# Patient Record
Sex: Female | Born: 1985 | Race: White | Hispanic: No | Smoking: Former smoker
Health system: Southern US, Community
[De-identification: ages and names within clinical notes are randomized; demographics above are authoritative.]

## PROBLEM LIST (undated history)

## (undated) ENCOUNTER — Inpatient Hospital Stay: Payer: Self-pay

## (undated) DIAGNOSIS — B977 Papillomavirus as the cause of diseases classified elsewhere: Secondary | ICD-10-CM

## (undated) DIAGNOSIS — F329 Major depressive disorder, single episode, unspecified: Secondary | ICD-10-CM

## (undated) DIAGNOSIS — F32A Depression, unspecified: Secondary | ICD-10-CM

## (undated) DIAGNOSIS — E042 Nontoxic multinodular goiter: Secondary | ICD-10-CM

## (undated) DIAGNOSIS — D069 Carcinoma in situ of cervix, unspecified: Secondary | ICD-10-CM

## (undated) DIAGNOSIS — F419 Anxiety disorder, unspecified: Secondary | ICD-10-CM

## (undated) HISTORY — DX: Nontoxic multinodular goiter: E04.2

## (undated) HISTORY — DX: Carcinoma in situ of cervix, unspecified: D06.9

## (undated) HISTORY — DX: Papillomavirus as the cause of diseases classified elsewhere: B97.7

---
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
## 2007-08-01 ENCOUNTER — Emergency Department (HOSPITAL_COMMUNITY): Admission: EM | Admit: 2007-08-01 | Discharge: 2007-08-01 | Payer: Self-pay | Admitting: Emergency Medicine

## 2008-01-21 ENCOUNTER — Emergency Department (HOSPITAL_COMMUNITY): Admission: EM | Admit: 2008-01-21 | Discharge: 2008-01-21 | Payer: Self-pay | Admitting: Family Medicine

## 2008-07-04 ENCOUNTER — Emergency Department (HOSPITAL_COMMUNITY): Admission: EM | Admit: 2008-07-04 | Discharge: 2008-07-04 | Payer: Self-pay | Admitting: Family Medicine

## 2008-09-17 ENCOUNTER — Inpatient Hospital Stay (HOSPITAL_COMMUNITY): Admission: AD | Admit: 2008-09-17 | Discharge: 2008-09-19 | Payer: Self-pay | Admitting: Obstetrics and Gynecology

## 2009-11-08 ENCOUNTER — Emergency Department (HOSPITAL_COMMUNITY): Admission: EM | Admit: 2009-11-08 | Discharge: 2009-11-08 | Payer: Self-pay | Admitting: Emergency Medicine

## 2010-06-25 ENCOUNTER — Emergency Department (HOSPITAL_COMMUNITY): Payer: Medicaid Other

## 2010-06-25 ENCOUNTER — Emergency Department (HOSPITAL_COMMUNITY)
Admission: EM | Admit: 2010-06-25 | Discharge: 2010-06-25 | Disposition: A | Discharge status: Home / Self Care | Payer: Medicaid Other | Attending: Emergency Medicine | Admitting: Emergency Medicine

## 2010-06-25 DIAGNOSIS — W2203XA Walked into furniture, initial encounter: Secondary | ICD-10-CM | POA: Insufficient documentation

## 2010-06-25 DIAGNOSIS — S90129A Contusion of unspecified lesser toe(s) without damage to nail, initial encounter: Secondary | ICD-10-CM | POA: Insufficient documentation

## 2010-06-25 DIAGNOSIS — Y92009 Unspecified place in unspecified non-institutional (private) residence as the place of occurrence of the external cause: Secondary | ICD-10-CM | POA: Insufficient documentation

## 2010-06-25 DIAGNOSIS — Z87891 Personal history of nicotine dependence: Secondary | ICD-10-CM | POA: Insufficient documentation

## 2010-06-25 DIAGNOSIS — Y93E3 Activity, vacuuming: Secondary | ICD-10-CM | POA: Insufficient documentation

## 2010-08-06 LAB — CBC
HCT: 32.9 % — ABNORMAL LOW (ref 36.0–46.0)
Hemoglobin: 11.4 g/dL — ABNORMAL LOW (ref 12.0–15.0)
MCHC: 34 g/dL (ref 30.0–36.0)
MCHC: 34.5 g/dL (ref 30.0–36.0)
MCV: 81.7 fL (ref 78.0–100.0)
Platelets: 202 10*3/uL (ref 150–400)
Platelets: 215 10*3/uL (ref 150–400)
RBC: 4.03 MIL/uL (ref 3.87–5.11)
RDW: 13.8 % (ref 11.5–15.5)
RDW: 13.9 % (ref 11.5–15.5)
WBC: 12.2 10*3/uL — ABNORMAL HIGH (ref 4.0–10.5)

## 2010-08-06 LAB — RH IMMUNE GLOB WKUP(>/=20WKS)(NOT WOMEN'S HOSP): Fetal Screen: NEGATIVE

## 2010-08-06 LAB — RPR: RPR Ser Ql: NONREACTIVE

## 2010-08-24 ENCOUNTER — Emergency Department (HOSPITAL_COMMUNITY)
Admission: EM | Admit: 2010-08-24 | Discharge: 2010-08-24 | Disposition: A | Discharge status: Home / Self Care | Payer: Medicaid Other | Attending: Emergency Medicine | Admitting: Emergency Medicine

## 2010-08-24 DIAGNOSIS — S239XXA Sprain of unspecified parts of thorax, initial encounter: Secondary | ICD-10-CM | POA: Insufficient documentation

## 2010-08-24 DIAGNOSIS — X58XXXA Exposure to other specified factors, initial encounter: Secondary | ICD-10-CM | POA: Insufficient documentation

## 2010-08-31 ENCOUNTER — Inpatient Hospital Stay (INDEPENDENT_AMBULATORY_CARE_PROVIDER_SITE_OTHER)
Admission: RE | Admit: 2010-08-31 | Discharge: 2010-08-31 | Disposition: A | Discharge status: Home / Self Care | Payer: Self-pay | Source: Ambulatory Visit | Attending: Family Medicine | Admitting: Family Medicine

## 2010-08-31 DIAGNOSIS — M799 Soft tissue disorder, unspecified: Secondary | ICD-10-CM

## 2010-09-13 NOTE — Discharge Summary (Signed)
NAMETAHARI, CLABAUGH              ACCOUNT NO.:  [id number]   MEDICAL RECORD NO.:  [id number]          PATIENT TYPE:  INP   LOCATION:  9124                          FACILITY:  [facility]   PHYSICIAN:  Malachi Pro. Ambrose Mantle, M.D. DATE OF BIRTH:  12-24-1985   DATE OF ADMISSION:  08/27/2006  DATE OF DISCHARGE:  08/29/2006                               DISCHARGE SUMMARY   An 25 year old white female, para 0, gravida 1, at 40-4/7 weeks'  gestation by 15-week ultrasound with [facility] August 26, 2006, presented with  regular contractions and leaking clear fluid since 3:30 a.m. on Aug 27, 2006.  The patient was evaluated in maternity admission, confirmed  rupture of membranes.  On vaginal exam, the cervix was 2 cm.  Prenatal  course essentially uncomplicated except for positive chlamydia at 36  weeks.  Blood group and type A negative with a negative antibody,  nonreactive serology, rubella equivocal, hepatitis B surface antigen  negative, HIV negative, GC and chlamydia negative.  One-hour Glucola  132.  Group B strep negative, positive chlamydia at 36 weeks.   PAST MEDICAL HISTORY:  Negative.  No known allergies.  No surgical  history.  No medications.   On admission, her vital signs were normal.  HEART/LUNGS:  Were normal.  The abdomen was soft and gravid, nontender.  Estimated fetal weight  about 8 pounds.  Cervix was 2 cm per the R.N. with vertex presentation and positive  rupture of membranes.   The patient received an epidural.  By 12:30 p.m., she was comfortable  with the epidural.  Fetal heart tones showed some early decelerations,  contractions every 2-4 minutes.  Pitocin at 6 milliunits a minute.  The  cervix was 7 cm, 100% and 0 station.  The patient progressed to complete  dilatation, pushed about 2 hours to deliver a living female infant at 3:46  p.m. with Apgars of 8 at 1 and 9 at five minutes.  Weight was 7 pounds  14 ounces.  Placenta expressed intact at 3:50 p.m.  Bilateral labial and  first-degree perineal lacerations were repaired with 3-0 Vicryl.  Blood  loss less than 500 mL.  Dr. Ellyn Hack was in attendance.  Postpartum, the  patient did well and was discharged on the second postpartum day.  The  patient had received her RhoGAM.  She will be a candidate for rubella  vaccine prior to discharge.  Initial hemoglobin 10.6, hematocrit 31.8,  white count 8300, platelet count 241,000.  Follow-up hemoglobin 9.1.  RPR was nonreactive.  Antibody screen was negative, and the patient  received the RhoGAM on Aug 28, 2006.   FINAL DIAGNOSES:  Intrauterine pregnancy at 40+ weeks, delivered vertex.   OPERATION:  Spontaneous delivery vertex, repair of small lacerations.   FINAL CONDITION:  Improved.   INSTRUCTIONS:  Include our regular discharge instruction booklet.  The  patient received her RhoGAM, and she will probably receive the rubella  vaccine prior to discharge.   DISCHARGE MEDICATIONS:  Per Dr. Ellyn Hack:  1. Vicodin 5/500 one or two every 4-6 hours as needed with 20 pills.  2. Motrin 800 mg  1 every 8 hours as needed, 40 pills with 2 refills.  3. Prenatal vitamins 1 a day, 100 and 2 refills.   The patient is to return to the office in 6 weeks for follow-up  examination.      Malachi Pro. Ambrose Mantle, M.D.  Electronically Signed     TFH/MEDQ  D:  08/29/2006  T:  08/29/2006  Job:  [id number]

## 2011-01-21 LAB — DIFFERENTIAL
Eosinophils Absolute: 0.1
Lymphs Abs: 1.8
Monocytes Relative: 5
Neutrophils Relative %: 75

## 2011-01-21 LAB — CBC
MCV: 83.7
RBC: 5.56 — ABNORMAL HIGH
WBC: 10

## 2011-01-21 LAB — GC/CHLAMYDIA PROBE AMP, GENITAL
Chlamydia, DNA Probe: NEGATIVE
GC Probe Amp, Genital: NEGATIVE

## 2011-01-21 LAB — BASIC METABOLIC PANEL
Chloride: 103
Creatinine, Ser: 0.8
GFR calc Af Amer: >60
Potassium: 3.4 — ABNORMAL LOW

## 2011-01-21 LAB — URINALYSIS, ROUTINE W REFLEX MICROSCOPIC
Glucose, UA: NEGATIVE
Protein, ur: NEGATIVE
Specific Gravity, Urine: 1.029
pH: 6

## 2011-01-21 LAB — WET PREP, GENITAL
Clue Cells Wet Prep HPF POC: NONE SEEN
Trich, Wet Prep: NONE SEEN
WBC, Wet Prep HPF POC: NONE SEEN

## 2011-01-27 LAB — POCT URINALYSIS DIP (DEVICE)
Glucose, UA: NEGATIVE
Hgb urine dipstick: NEGATIVE
Nitrite: NEGATIVE
Protein, ur: NEGATIVE
Urobilinogen, UA: 1
pH: 7

## 2011-11-14 ENCOUNTER — Encounter (HOSPITAL_COMMUNITY): Payer: Self-pay | Admitting: *Deleted

## 2011-11-14 ENCOUNTER — Emergency Department (HOSPITAL_COMMUNITY)
Admission: EM | Admit: 2011-11-14 | Discharge: 2011-11-14 | Disposition: A | Discharge status: Home / Self Care | Payer: Medicaid Other | Attending: Emergency Medicine | Admitting: Emergency Medicine

## 2011-11-14 DIAGNOSIS — IMO0001 Reserved for inherently not codable concepts without codable children: Secondary | ICD-10-CM | POA: Insufficient documentation

## 2011-11-14 DIAGNOSIS — W57XXXA Bitten or stung by nonvenomous insect and other nonvenomous arthropods, initial encounter: Secondary | ICD-10-CM

## 2011-11-14 MED ORDER — HYDROCORTISONE 1 % EX CREA: TOPICAL_CREAM | CUTANEOUS | Status: DC

## 2011-11-14 MED ORDER — DIPHENHYDRAMINE HCL 25 MG PO TABS: 25.0000 mg | ORAL_TABLET | Freq: Four times a day (QID) | ORAL | Status: DC

## 2011-11-14 NOTE — ED Notes (Signed)
Pt has 2 areas that are itching, one on her right elbow and one on her left shoulder.  Pt states that she thinks that she was bit by a spider last night.

## 2011-11-14 NOTE — ED Notes (Signed)
Pt states bite increasing in size since arrival.  Denies sob.  No hives noted.

## 2011-11-14 NOTE — ED Provider Notes (Signed)
History     CSN: [id number]  Arrival date & time 11/14/11  0141   First MD Initiated Contact with Patient 11/14/11 0415      Chief Complaint  Patient presents with  . Insect Bite    (Consider location/radiation/quality/duration/timing/severity/associated sxs/prior treatment) Patient is a 26 y.o. female presenting with animal bite. The history is provided by the patient (pt thiniks was bit by spider).  Animal Bite  The incident occurred just prior to arrival. The incident occurred at home. There is an injury to the right elbow. Pertinent negatives include no focal weakness, no decreased responsiveness, no light-headedness, no loss of consciousness, no seizures, no tingling, no weakness and no memory loss. There have been no prior injuries to these areas. There were no sick contacts.    History reviewed. No pertinent past medical history.  History reviewed. No pertinent past surgical history.  No family history on file.  History  Substance Use Topics  . Smoking status: Never Smoker   . Smokeless tobacco: Not on file  . Alcohol Use: No    OB History    Grav Para Term Preterm Abortions TAB SAB Ect Mult Living                  Review of Systems  Constitutional: Negative for decreased responsiveness.  Skin: Positive for rash.  Neurological: Negative for tingling, focal weakness, seizures, loss of consciousness, weakness and light-headedness.  Psychiatric/Behavioral: Negative for memory loss.  All other systems reviewed and are negative.    Allergies  Review of patient's allergies indicates no known allergies.  Home Medications   Current Outpatient Rx  Name Route Sig Dispense Refill  . SERTRALINE HCL 100 MG PO TABS Oral Take 100 mg by mouth daily.      BP 115/78  Pulse 94  Temp 97.5 F (36.4 C) (Oral)  Resp 18  SpO2 98%  LMP 11/13/2011  Physical Exam  Constitutional: She is oriented to person, place, and time. She appears well-developed and well-nourished.    HENT:  Head: Normocephalic and atraumatic.  Eyes: Conjunctivae and EOM are normal. Pupils are equal, round, and reactive to light.  Neck: Normal range of motion.  Cardiovascular: Normal rate, regular rhythm and normal heart sounds.   Pulmonary/Chest: Effort normal and breath sounds normal.  Abdominal: Soft. Bowel sounds are normal.  Musculoskeletal: Normal range of motion.  Neurological: She is alert and oriented to person, place, and time.  Skin: Skin is warm and dry. Rash noted.       Small area of induration to rt elbow and left shoulder.  No erythema,  No drainage  Psychiatric: She has a normal mood and affect. Her behavior is normal.    ED Course  Procedures (including critical care time)  Labs Reviewed - No data to display No results found.   No diagnosis found.    MDM  Minor insect bite.  Will benadryl and hydrocortisone to fu outpt        Saima Monterroso Lytle Michaels, MD 11/14/11 [phone]

## 2011-11-14 NOTE — ED Notes (Signed)
Pt with 1 inch insect bite.  No redness or warmth.  Size has not increased since I evaluated it in the waiting area.

## 2011-11-14 NOTE — ED Notes (Signed)
Pt continues to deny sob.

## 2011-12-03 ENCOUNTER — Encounter (HOSPITAL_COMMUNITY): Payer: Self-pay | Admitting: Emergency Medicine

## 2011-12-03 ENCOUNTER — Emergency Department (HOSPITAL_COMMUNITY): Payer: Self-pay

## 2011-12-03 ENCOUNTER — Emergency Department (HOSPITAL_COMMUNITY)
Admission: EM | Admit: 2011-12-03 | Discharge: 2011-12-03 | Disposition: A | Discharge status: Home / Self Care | Payer: Medicaid Other | Attending: Emergency Medicine | Admitting: Emergency Medicine

## 2011-12-03 DIAGNOSIS — S5000XA Contusion of unspecified elbow, initial encounter: Secondary | ICD-10-CM | POA: Insufficient documentation

## 2011-12-03 DIAGNOSIS — W19XXXA Unspecified fall, initial encounter: Secondary | ICD-10-CM

## 2011-12-03 DIAGNOSIS — W108XXA Fall (on) (from) other stairs and steps, initial encounter: Secondary | ICD-10-CM | POA: Insufficient documentation

## 2011-12-03 DIAGNOSIS — S5002XA Contusion of left elbow, initial encounter: Secondary | ICD-10-CM

## 2011-12-03 MED ORDER — OXYCODONE-ACETAMINOPHEN 5-325 MG PO TABS: 1.0000 | ORAL_TABLET | ORAL | Status: AC | PRN

## 2011-12-03 NOTE — ED Notes (Signed)
Spoke with radiology--reports humerus Xray will also visualize the elbow for deformity

## 2011-12-03 NOTE — ED Provider Notes (Signed)
History  This chart was scribed for Alexandria Booze, MD by Alexandria [name]. The patient was seen in room TR11C/TR11C. Patient's care was started at 1538.     CSN: [id number]  Arrival date & time 12/03/11  1538   First MD Initiated Contact with Patient 12/03/11 1700      Chief Complaint  Patient presents with  . Arm Injury    (Consider location/radiation/quality/duration/timing/severity/associated sxs/prior treatment) The history is provided by the patient. No language interpreter was used.    Alexandria [name] is a 26 y.o. female who presents to the Emergency Department complaining of moderate to severe, constant left upper arm and elbow pain with associated swelling to elbow onset 1 day ago when patient fell down four steps. Patient rates pain at 7/10 at rest and 10/10 with ROM of elbow. Patient reports no other significant injuries or symptoms. There is no significant past medical, surgical or family history. She has never smoked.  PCP - None  History  Substance Use Topics  . Smoking status: Never Smoker   . Smokeless tobacco: Not on file  . Alcohol Use: No    OB History    Grav Para Term Preterm Abortions TAB SAB Ect Mult Living                  Review of Systems  All other systems reviewed and are negative.    Allergies  Review of patient's allergies indicates no known allergies.  Home Medications   Current Outpatient Rx  Name Route Sig Dispense Refill  . SERTRALINE HCL 100 MG PO TABS Oral Take 100 mg by mouth daily.      BP 112/70  Pulse 76  Temp 98.2 F (36.8 C) (Oral)  Resp 16  SpO2 99%  LMP 11/13/2011  Physical Exam  Constitutional: She is oriented to person, place, and time. She appears well-developed and well-nourished.  HENT:  Head: Normocephalic and atraumatic.  Musculoskeletal:       Left elbow: She exhibits no swelling and no deformity. no tenderness found.       Left elbow: No swelling or deformity. No point tenderness. Pain with flexion,  extension, pronation and supination. Able to get 45 degrees of movement without pain. Neurovascularly intact. Sensation to left hand is normal.  Neurological: She is alert and oriented to person, place, and time.  Psychiatric: Her behavior is normal.    ED Course  Procedures (including critical care time) DIAGNOSTIC STUDIES: Oxygen Saturation is 99% on room air, normal by my interpretation.    COORDINATION OF CARE: 5:01am- Patient informed of current plan for treatment and evaluation and agrees with plan at this time. Will provide a sling for patient left arm. Will discharge patient home with prescription for Roxicet 5-325 mg.  Dg Humerus Left  12/03/2011  *RADIOLOGY REPORT*  Clinical Data: Arm injury.  Left arm pain.  LEFT HUMERUS - 2+ VIEW  Comparison: None.  Findings: No acute bony abnormality.  Specifically, no fracture, subluxation, or dislocation.  Soft tissues are intact.  IMPRESSION: Normal study.  Original Report Authenticated By: Alexandria [name], M.D.     1. Fall   2. Contusion of elbow, left       MDM  Fall with injury of the left elbow. X-rays will be obtained.  X-rays show no evidence of fracture. She is in with prescription for Percocet for pain and advised to followup with orthopedics if not improving. She was placed in a sling for comfort.   I  personally performed the services described in this documentation, which was scribed in my presence. The recorded information has been reviewed and considered.   Alexandria Booze, MD 12/05/11 [phone]

## 2011-12-03 NOTE — ED Notes (Signed)
Ortho tech paged to notify of orders 

## 2011-12-03 NOTE — ED Notes (Signed)
Pt reports fell down stairs last night, and injured L arm-- pt reports pain in upper arm and in elbow--elbow swollen; CMS intact distal to injury; + radial pulse

## 2011-12-03 NOTE — Progress Notes (Signed)
Orthopedic Tech Progress Note Patient Details:  Alexandria [name] [birth year] [id number]  Ortho Devices Type of Ortho Device: Arm foam sling Ortho Device/Splint Location: (L) UE Ortho Device/Splint Interventions: Application   Jennye Moccasin 12/03/2011, 5:30 PM

## 2011-12-20 ENCOUNTER — Encounter (HOSPITAL_COMMUNITY): Payer: Self-pay | Admitting: Family Medicine

## 2011-12-20 ENCOUNTER — Emergency Department (HOSPITAL_COMMUNITY)
Admission: EM | Admit: 2011-12-20 | Discharge: 2011-12-20 | Disposition: A | Discharge status: Home / Self Care | Payer: Medicaid Other | Attending: Emergency Medicine | Admitting: Emergency Medicine

## 2011-12-20 DIAGNOSIS — N898 Other specified noninflammatory disorders of vagina: Secondary | ICD-10-CM | POA: Insufficient documentation

## 2011-12-20 LAB — WET PREP, GENITAL: WBC, Wet Prep HPF POC: NONE SEEN

## 2011-12-20 MED ORDER — LIDOCAINE HCL (PF) 1 % IJ SOLN
INTRAMUSCULAR | Status: AC
  Administered 2011-12-20: 2.1 mL
  Filled 2011-12-20: qty 5

## 2011-12-20 MED ORDER — CEFTRIAXONE SODIUM 250 MG IJ SOLR
250.0000 mg | Freq: Once | INTRAMUSCULAR | Status: AC
  Administered 2011-12-20: 250 mg via INTRAMUSCULAR
  Filled 2011-12-20: qty 250

## 2011-12-20 MED ORDER — AZITHROMYCIN 250 MG PO TABS
1000.0000 mg | ORAL_TABLET | Freq: Once | ORAL | Status: AC
  Administered 2011-12-20: 1000 mg via ORAL
  Filled 2011-12-20: qty 4

## 2011-12-20 NOTE — ED Provider Notes (Signed)
History     CSN: [id number]  Arrival date & time 12/20/11  4696   First MD Initiated Contact with Patient 12/20/11 2232      Chief Complaint  Patient presents with  . Vaginal Discharge    (Consider location/radiation/quality/duration/timing/severity/associated sxs/prior treatment) HPI Comments: Patient presents with white vaginal discharge and odor for approximately one week. Patient states that the symptoms she is having now are similar to previous when she had bacterial vaginosis. Patient is requesting a check for this. Patient states that she recently had unprotected sex and also wants to be checked for sexually transmitted diseases. She requests to be treated for GC/ Chlamydia. She denies urinary symptoms. She denies abdominal pain, nausea, vomiting, fever. Onset gradual. Course is constant. Nothing makes symptoms better or worse. No treatments prior to arrival. LMP 2 weeks ago and was normal for her.   Patient is a 26 y.o. female presenting with vaginal discharge. The history is provided by the patient.  Vaginal Discharge This is a new problem. The current episode started in the past 7 days. The problem occurs constantly. The problem has been unchanged. Pertinent negatives include no abdominal pain, chest pain, coughing, fever, headaches, myalgias, nausea, rash, sore throat or vomiting. Nothing aggravates the symptoms. She has tried nothing for the symptoms.    History reviewed. No pertinent past medical history.  History reviewed. No pertinent past surgical history.  History reviewed. No pertinent family history.  History  Substance Use Topics  . Smoking status: Never Smoker   . Smokeless tobacco: Not on file  . Alcohol Use: No    OB History    Grav Para Term Preterm Abortions TAB SAB Ect Mult Living                  Review of Systems  Constitutional: Negative for fever.  HENT: Negative for sore throat and rhinorrhea.   Eyes: Negative for redness.  Respiratory:  Negative for cough.   Cardiovascular: Negative for chest pain.  Gastrointestinal: Negative for nausea, vomiting, abdominal pain and diarrhea.  Genitourinary: Positive for vaginal discharge. Negative for dysuria, flank pain and vaginal bleeding.  Musculoskeletal: Negative for myalgias.  Skin: Negative for rash.  Neurological: Negative for headaches.    Allergies  Review of patient's allergies indicates no known allergies.  Home Medications   Current Outpatient Rx  Name Route Sig Dispense Refill  . SERTRALINE HCL 100 MG PO TABS Oral Take 100 mg by mouth daily.      BP 103/64  Pulse 115  Temp 98.4 F (36.9 C) (Oral)  Resp 16  SpO2 97%  Physical Exam  Nursing note and vitals reviewed. Constitutional: She appears well-developed and well-nourished.  HENT:  Head: Normocephalic and atraumatic.  Eyes: Conjunctivae are normal. Right eye exhibits no discharge. Left eye exhibits no discharge.  Neck: Normal range of motion. Neck supple.  Cardiovascular: Normal rate, regular rhythm and normal heart sounds.   Pulmonary/Chest: Effort normal and breath sounds normal.  Abdominal: Soft. There is no tenderness.  Genitourinary: Uterus is not tender. Cervix exhibits discharge (thin white). Cervix exhibits no motion tenderness and no friability. Right adnexum displays no mass, no tenderness and no fullness. Left adnexum displays no mass, no tenderness and no fullness.  Neurological: She is alert.  Skin: Skin is warm and dry.  Psychiatric: She has a normal mood and affect.    ED Course  Procedures (including critical care time)   Labs Reviewed  WET PREP, GENITAL  GC/CHLAMYDIA PROBE AMP,  GENITAL   No results found.   1. Vaginal Discharge     10:46 PM Patient seen and examined. Medications ordered. Will perform pelvic exam.   Vital signs reviewed and are as follows: Filed Vitals:   12/20/11 1838  BP: 103/64  Pulse: 115  Temp: 98.4 F (36.9 C)  Resp: 16   11:08 PM Pelvic exam  performed with nurse tech chaperone.   11:30 PM patient has received Rocephin and azithromycin. Patient informed of what prep results. She is urged not to have sexual intercourse for 7 days and follow up with health department for further testing. Patient verbalizes understanding and agrees with plan.   MDM  White vaginal discharge. Wet prep is negative for infections. Patient treated for GC/Chlamydia. She appears well. No adnexal tenderness on exam. No concern for AP. No concern for tubo-ovarian abscess.         [place], [place] 12/20/11 [phone]

## 2011-12-20 NOTE — ED Notes (Signed)
Patient updated on wait time; apologized about delay.  Patient verbalized understanding; patient denies any changes in symptoms.  Will continue to monitor.

## 2011-12-20 NOTE — ED Notes (Signed)
Patient asking about wait time; apologized about delay.  Patient verbalized understanding; no respiratory or acute distress.  Will continue to monitor.

## 2011-12-20 NOTE — ED Notes (Signed)
PELVIC CART SET UP AT BEDSIDE BY NURSE TECH.

## 2011-12-20 NOTE — ED Notes (Signed)
Per  Pt white vaginal discharge for about 1 month. Denies bleeding or pain.

## 2011-12-21 NOTE — ED Provider Notes (Signed)
Medical screening examination/treatment/procedure(s) were performed by non-physician practitioner and as supervising physician I was immediately available for consultation/collaboration.  Flint Melter, MD 12/21/11 [phone]

## 2012-01-14 ENCOUNTER — Emergency Department (HOSPITAL_COMMUNITY)
Admission: EM | Admit: 2012-01-14 | Discharge: 2012-01-14 | Disposition: A | Discharge status: Home / Self Care | Payer: Medicaid Other | Attending: Emergency Medicine | Admitting: Emergency Medicine

## 2012-01-14 DIAGNOSIS — W57XXXA Bitten or stung by nonvenomous insect and other nonvenomous arthropods, initial encounter: Secondary | ICD-10-CM | POA: Insufficient documentation

## 2012-01-14 DIAGNOSIS — T148 Other injury of unspecified body region: Secondary | ICD-10-CM | POA: Insufficient documentation

## 2012-01-14 DIAGNOSIS — R21 Rash and other nonspecific skin eruption: Secondary | ICD-10-CM | POA: Insufficient documentation

## 2012-01-14 DIAGNOSIS — L739 Follicular disorder, unspecified: Secondary | ICD-10-CM

## 2012-01-14 DIAGNOSIS — L738 Other specified follicular disorders: Secondary | ICD-10-CM | POA: Insufficient documentation

## 2012-01-14 MED ORDER — CEPHALEXIN 500 MG PO CAPS: 500.0000 mg | ORAL_CAPSULE | Freq: Four times a day (QID) | ORAL | Status: DC

## 2012-01-14 MED ORDER — PERMETHRIN 5 % EX CREA: TOPICAL_CREAM | CUTANEOUS | Status: DC

## 2012-01-14 NOTE — ED Provider Notes (Signed)
Medical screening examination/treatment/procedure(s) were performed by non-physician practitioner and as supervising physician I was immediately available for consultation/collaboration.   Gerhard Munch, MD 01/14/12 1054

## 2012-01-14 NOTE — ED Provider Notes (Signed)
History     CSN: [id number]  Arrival date & time 01/14/12  8295   First MD Initiated Contact with Patient 01/14/12 [phone]      Chief Complaint  Patient presents with  . Insect Bite    (Consider location/radiation/quality/duration/timing/severity/associated sxs/prior treatment) The history is provided by the patient and medical records.    Alexandria [name] is a 26 y.o. female presents to the emergency department complaining of insect bites.  The onset of the symptoms was  gradual starting 2 months ago.  The patient has associated itching, rash on mans pubis.  The symptoms have been  intermittent, stabilized.  nothing makes the symptoms worse and nothing makes symptoms better.  The patient denies fever, chills, headache, neck pain, nausea, vomiting, shortness of breath, chest pain.  Pt states she's been seen here a number of times for the same thing. She states she's always diagnosed with insect bites. She states she no longer goes outside anymore because of mosquito bites.  She lives at home with her 2 children and an aunt. She states none of them have any of the same symptoms.  Has a history of eczema but she states these are different.  She also complains of a rash in her vaginal area.  She states that this is recent and she "scraped white things off of it yesterday"  patient states it itches and is now painful.  She denies history of unprotected sex. She denies sexual partners with same symptoms.  Vaginal rash began 5 days ago.  Pt does shave her private area.  She also states she swims in a public pool.  She states she is very concerned about scabies and no one is treating her for this.   No past medical history on file.  No past surgical history on file.  No family history on file.  History  Substance Use Topics  . Smoking status: Never Smoker   . Smokeless tobacco: Not on file  . Alcohol Use: No    OB History    Grav Para Term Preterm Abortions TAB SAB Ect Mult Living         Review of Systems  Constitutional: Negative for fever, diaphoresis, appetite change, fatigue and unexpected weight change.  HENT: Negative for mouth sores and neck stiffness.   Eyes: Negative for visual disturbance.  Respiratory: Negative for cough, chest tightness, shortness of breath and wheezing.   Cardiovascular: Negative for chest pain.  Gastrointestinal: Negative for nausea, vomiting, abdominal pain, diarrhea and constipation.  Genitourinary: Negative for dysuria, urgency, frequency and hematuria.  Skin: Positive for rash.  Neurological: Negative for syncope, light-headedness and headaches.  Psychiatric/Behavioral: Negative for disturbed wake/sleep cycle. The patient is not nervous/anxious.     Allergies  Review of patient's allergies indicates no known allergies.  Home Medications   Current Outpatient Rx  Name Route Sig Dispense Refill  . OVER THE COUNTER MEDICATION Topical Apply 1 application topically as needed. Benadryl creams  For itching.    Marland Kitchen SERTRALINE HCL 100 MG PO TABS Oral Take 100 mg by mouth daily.    . CEPHALEXIN 500 MG PO CAPS Oral Take 1 capsule (500 mg total) by mouth 4 (four) times daily. 40 capsule 0  . PERMETHRIN 5 % EX CREA  Apply to entire body other than face - let sit for 14 hours then wash off, may repeat in 1 week if still having symptoms 60 g 1    There were no vitals taken for this visit.  Physical Exam  Nursing note and vitals reviewed. Constitutional: She appears well-developed and well-nourished. No distress.  HENT:  Head: Normocephalic and atraumatic.  Mouth/Throat: Oropharynx is clear and moist. No oropharyngeal exudate.  Eyes: Conjunctivae normal are normal. No scleral icterus.  Neck: Normal range of motion.  Cardiovascular: Normal rate, regular rhythm, normal heart sounds and intact distal pulses.  Exam reveals no gallop and no friction rub.   No murmur heard. Pulmonary/Chest: Effort normal and breath sounds normal. No  respiratory distress. She has no wheezes.  Musculoskeletal: Normal range of motion. She exhibits no edema.  Neurological: She is alert.       Speech is clear and goal oriented Moves extremities without ataxia  Skin: Skin is warm and dry. Lesion and rash noted. Rash is not vesicular and not urticarial. She is not diaphoretic.          [name] areas of erythema on her feet, arms and hands, most of then excoriated and scabbing  Psychiatric: She has a normal mood and affect.    ED Course  Procedures (including critical care time)  Labs Reviewed - No data to display No results found.   1. Rash   2. Folliculitis       MDM  Alexandria [name] presents with insect bites and vaginal rash.  Likely folliculitis of the vaginal area. Will treat with Keflex.  Patient particularly concerned about scabies therefore I will treat for same.  And strict instructions on scabies her medication from home.    I have discussed this with the patient.  I have also discussed reasons to return immediately to the ER.  Patient expresses understanding and agrees with plan.  1. Medications: keflex, permethrin 2. Treatment: use permethrin as directed, take antibiotic as directed; do not scratch bites; do not pop pustules, do not shave area with folliculitis 3. Follow Up: Dermatology          Dierdre Forth, PA-C 01/14/12 [phone]

## 2012-01-14 NOTE — ED Notes (Signed)
Pt reports some sort of insect bite marks all over body.  States they itch badly and have seemed to spread even to groin area.  Pt states she has even found what appears to be eggs on her skin.  Denies anyone else in home is having this problem.  States she has been in RD for same in past 2 months and was told it is mosquito bites.  Pt alert oriented X4

## 2012-03-13 ENCOUNTER — Encounter (HOSPITAL_COMMUNITY): Payer: Self-pay | Admitting: Physical Medicine and Rehabilitation

## 2012-03-13 ENCOUNTER — Emergency Department (HOSPITAL_COMMUNITY)
Admission: EM | Admit: 2012-03-13 | Discharge: 2012-03-13 | Disposition: A | Discharge status: Home / Self Care | Payer: Self-pay | Attending: Emergency Medicine | Admitting: Emergency Medicine

## 2012-03-13 DIAGNOSIS — R21 Rash and other nonspecific skin eruption: Secondary | ICD-10-CM | POA: Insufficient documentation

## 2012-03-13 DIAGNOSIS — L299 Pruritus, unspecified: Secondary | ICD-10-CM | POA: Insufficient documentation

## 2012-03-13 DIAGNOSIS — R109 Unspecified abdominal pain: Secondary | ICD-10-CM | POA: Insufficient documentation

## 2012-03-13 MED ORDER — HYDROXYZINE HCL 25 MG PO TABS: 25.0000 mg | ORAL_TABLET | Freq: Four times a day (QID) | ORAL | Status: DC | PRN

## 2012-03-13 MED ORDER — PREDNISONE 20 MG PO TABS: 20.0000 mg | ORAL_TABLET | Freq: Every day | ORAL | Status: DC

## 2012-03-13 MED ORDER — DIPHENHYDRAMINE HCL 25 MG PO CAPS
25.0000 mg | ORAL_CAPSULE | Freq: Once | ORAL | Status: AC
  Administered 2012-03-13: 25 mg via ORAL
  Filled 2012-03-13: qty 1

## 2012-03-13 MED ORDER — PREDNISONE 20 MG PO TABS
40.0000 mg | ORAL_TABLET | Freq: Once | ORAL | Status: AC
  Administered 2012-03-13: 40 mg via ORAL
  Filled 2012-03-13: qty 2

## 2012-03-13 NOTE — ED Notes (Addendum)
Pt presents to department for evaluation of possible allergic reaction. States she noticed hives to bilateral arms and legs x2 days ago. Pt states severe itching. History of eczema. Denies pain. She is alert and oriented x4. No signs of distress noted. Respirations unlabored. No wheezing noted.

## 2012-03-13 NOTE — ED Notes (Addendum)
Pt c/o rash that started a few days ago on hand and arms but now feels like she is itchy all over and that the rash is spreading to legs. Pt denies any new body wash/soap, but reports her mom washed some of her clothes and normally she doesn't. Pt in nad, airway in tact, pt able to speak in full sentences, skin warm, dry and pink. Pt also reports she recently had an abortion and had intercourse before she was told she was allowed to and is afraid that is what is causing the rash. Pt denies any abnormal vaginal d/c, reports she is still spotting a little bit but that's how its been for a few weeks.

## 2012-03-13 NOTE — ED Provider Notes (Signed)
History  This chart was scribed for Alexandria Razor, MD by Ladona Ridgel Day, ED scribe. This patient was seen in room TR11C/TR11C and the patient's care was started at 1225.   CSN: [id number]  Arrival date & time 03/13/12  1225   First MD Initiated Contact with Patient 03/13/12 1352      Chief Complaint  Patient presents with  . Allergic Reaction   Patient is a 26 y.o. female presenting with allergic reaction. The history is provided by the patient. No language interpreter was used.  Allergic Reaction The primary symptoms are  abdominal pain (cramping ) and rash (itchy, diffuse over her body). The primary symptoms do not include shortness of breath, nausea or vomiting. The current episode started more than 2 days ago. The problem has not changed since onset.This is a new problem.  The rash is associated with itching.   The onset of the reaction was associated with a new medication. Significant symptoms also include itching. Significant symptoms that are not present include eye redness.  Alexandria [name] is a 26 y.o. female who presents to the Emergency Department complaining of a diffuse itchy rash over her body which began 3 days ago. She states 8 days ago had surgical abortion and was started on a new oral birth control medicine 6 days ago. She states worried that she's having an allergic reaction to her medicine and has been having an itching/erythematous rash all over her body the past 3 days. She states post abortion, with some light spotting, no vaginal discharge or heavy bleeding, and associated mild abdominal cramping.   No past medical history on file.  No past surgical history on file.  No family history on file.  History  Substance Use Topics  . Smoking status: Never Smoker   . Smokeless tobacco: Not on file  . Alcohol Use: No    OB History    Grav Para Term Preterm Abortions TAB SAB Ect Mult Living   1    1           Review of Systems  Constitutional: Negative for fever  and chills.  HENT: Negative for congestion.   Eyes: Negative for redness.  Respiratory: Negative for shortness of breath.   Gastrointestinal: Positive for abdominal pain (cramping ). Negative for nausea and vomiting.  Skin: Positive for itching and rash (itchy, diffuse over her body).  Neurological: Negative for weakness.  All other systems reviewed and are negative.    Allergies  Review of patient's allergies indicates no known allergies.  Home Medications   Current Outpatient Rx  Name  Route  Sig  Dispense  Refill  . OVER THE COUNTER MEDICATION   Topical   Apply 1 application topically daily as needed. Benadryl creams  For itching.         Marland Kitchen PERMETHRIN 5 % EX CREA   Topical   Apply 1 application topically 2 (two) times daily.         . SERTRALINE HCL 100 MG PO TABS   Oral   Take 100 mg by mouth daily.           Triage Vitals: BP 139/85  Pulse 108  Temp 97.9 F (36.6 C) (Oral)  Resp 20  SpO2 97%  Physical Exam  Nursing note and vitals reviewed. Constitutional: She appears well-developed and well-nourished. No distress.  HENT:  Head: Normocephalic and atraumatic.  Eyes: Conjunctivae normal are normal. Right eye exhibits no discharge. Left eye exhibits no discharge.  Neck:  Neck supple.  Cardiovascular: Normal rate, regular rhythm and normal heart sounds.  Exam reveals no gallop and no friction rub.   No murmur heard. Pulmonary/Chest: Effort normal and breath sounds normal. No respiratory distress.  Abdominal: Soft. She exhibits no distension. There is no tenderness.  Musculoskeletal: She exhibits no edema and no tenderness.  Neurological: She is alert.  Skin: Skin is warm and dry. Rash noted.       Diffuse erythematous papular rash of her BUE and an area on upper back, non tender, no drainage.   Psychiatric: She has a normal mood and affect. Her behavior is normal. Thought content normal.    ED Course  Procedures (including critical care  time) DIAGNOSTIC STUDIES: Oxygen Saturation is 97% on room air, normal by my interpretation.    COORDINATION OF CARE: At 220 PM Discussed treatment plan with patient which includes prednisone. Patient agrees.   Labs Reviewed - No data to display No results found.   1. Rash       MDM  27 year old female with rash consistent with a mild allergic reaction. No evidence of hemodynamic instability or airway compromise. Plan symptomatic treatment. Return precautions discussed. Outpatient followup as needed otherwise.  I personally preformed the services scribed in my presence. The recorded information has been reviewed and considered. Alexandria Razor, MD.          Alexandria Razor, MD 03/15/12 1022

## 2012-04-28 HISTORY — PX: CERVICAL BIOPSY  W/ LOOP ELECTRODE EXCISION: SUR135

## 2012-06-15 ENCOUNTER — Other Ambulatory Visit (HOSPITAL_COMMUNITY)
Admission: RE | Admit: 2012-06-15 | Discharge: 2012-06-15 | Disposition: A | Discharge status: Home / Self Care | Payer: Medicaid Other | Source: Ambulatory Visit | Attending: Unknown Physician Specialty | Admitting: Unknown Physician Specialty

## 2012-06-15 DIAGNOSIS — D069 Carcinoma in situ of cervix, unspecified: Secondary | ICD-10-CM | POA: Insufficient documentation

## 2012-06-15 DIAGNOSIS — R87610 Atypical squamous cells of undetermined significance on cytologic smear of cervix (ASC-US): Secondary | ICD-10-CM | POA: Insufficient documentation

## 2012-06-15 HISTORY — PX: COLPOSCOPY: SHX161

## 2012-07-15 ENCOUNTER — Telehealth: Payer: Self-pay

## 2012-07-15 NOTE — Telephone Encounter (Signed)
Returned patients call and left a message requesting that patient call us back if she still has a question. (We do not do payment plans unfortunately, If patient wants gardasil she will have to pay full price at time of injection)

## 2012-07-15 NOTE — Telephone Encounter (Signed)
Patient called in and stated she has already received one gardasil injection and would like to discuss a payment plan to get the other 2 injections while she is waiting on her medicaid.  She states she has a LEEP procedure coming up and feels very strongly that she needs the last 2 gardasil injections.  I did not see any future appointments on her chart.

## 2012-07-17 ENCOUNTER — Emergency Department (INDEPENDENT_AMBULATORY_CARE_PROVIDER_SITE_OTHER)
Admission: EM | Admit: 2012-07-17 | Discharge: 2012-07-17 | Disposition: A | Discharge status: Home / Self Care | Payer: Medicaid Other | Source: Home / Self Care | Attending: Family Medicine | Admitting: Family Medicine

## 2012-07-17 ENCOUNTER — Encounter (HOSPITAL_COMMUNITY): Payer: Self-pay | Admitting: Emergency Medicine

## 2012-07-17 DIAGNOSIS — Z331 Pregnant state, incidental: Secondary | ICD-10-CM

## 2012-07-17 DIAGNOSIS — Z3201 Encounter for pregnancy test, result positive: Secondary | ICD-10-CM

## 2012-07-17 LAB — POCT URINALYSIS DIP (DEVICE)
Bilirubin Urine: NEGATIVE
Hgb urine dipstick: NEGATIVE
Ketones, ur: NEGATIVE mg/dL
Specific Gravity, Urine: 1.015 (ref 1.005–1.030)
pH: 7 (ref 5.0–8.0)

## 2012-07-17 NOTE — ED Notes (Signed)
Pt c/o nausea x 4 days. 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 Has been experiencing vaginal discharge. Refuses pap smear today. Not on any birth control. No fever, diarrhea.

## 2012-07-17 NOTE — ED Provider Notes (Signed)
History     CSN: [id number]  Arrival date & time 07/17/12  1309   First MD Initiated Contact with Patient 07/17/12 1330      Chief Complaint  Patient presents with  . Possible Pregnancy    (Consider location/radiation/quality/duration/timing/severity/associated sxs/prior treatment) Patient is a 27 y.o. female presenting with female genitourinary complaint. The history is provided by the patient.  Female GU Problem Primary symptoms include no vaginal bleeding. Episode onset: nausea and heartburn for 3-4 days, did home preg test this am, weak pos, lmp 2/23. here for preg test.    History reviewed. No pertinent past medical history.  History reviewed. No pertinent past surgical history.  No family history on file.  History  Substance Use Topics  . Smoking status: Never Smoker   . Smokeless tobacco: Not on file  . Alcohol Use: No    OB History   Grav Para Term Preterm Abortions TAB SAB Ect Mult Living   1    1           Review of Systems  Constitutional: Negative.   Gastrointestinal: Negative.   Genitourinary: Negative.  Negative for vaginal bleeding.    Allergies  Review of patient's allergies indicates no known allergies.  Home Medications   Current Outpatient Rx  Name  Route  Sig  Dispense  Refill  . gabapentin (NEURONTIN) 300 MG capsule   Oral   Take 300 mg by mouth 4 (four) times daily.         Marland Kitchen glycopyrrolate (ROBINUL) 2 MG tablet   Oral   Take 2 mg by mouth 2 (two) times daily.         . sertraline (ZOLOFT) 100 MG tablet   Oral   Take 50 mg by mouth daily.          . hydrOXYzine (ATARAX/VISTARIL) 25 MG tablet   Oral   Take 1 tablet (25 mg total) by mouth every 6 (six) hours as needed for itching.   20 tablet   0   . OVER THE COUNTER MEDICATION   Topical   Apply 1 application topically daily as needed. Benadryl creams  For itching.         . permethrin (ELIMITE) 5 % cream   Topical   Apply 1 application topically 2 (two) times  daily.         . predniSONE (DELTASONE) 20 MG tablet   Oral   Take 1 tablet (20 mg total) by mouth daily.   5 tablet   0     BP 105/73  Pulse 70  Temp(Src) 98 F (36.7 C) (Oral)  SpO2 95%  LMP 06/22/2012  Physical Exam  Nursing note and vitals reviewed. Constitutional: She is oriented to person, place, and time. She appears well-developed and well-nourished.  Abdominal: Soft. Bowel sounds are normal. She exhibits no mass. There is no tenderness. There is no rebound and no guarding.  Neurological: She is alert and oriented to person, place, and time.  Skin: Skin is warm and dry.    ED Course  Procedures (including critical care time)  Labs Reviewed  POCT PREGNANCY, URINE - Abnormal; Notable for the following:    Preg Test, Ur POSITIVE (*)    All other components within normal limits   No results found.   1. Pregnancy as incidental finding       MDM  upreg pos.        Linna Hoff, MD 07/17/12 1434

## 2012-07-19 NOTE — Telephone Encounter (Signed)
Patient not returning call. Will discuss at her upcoming visit.

## 2012-08-06 ENCOUNTER — Encounter: Payer: Self-pay | Admitting: Obstetrics and Gynecology

## 2012-08-06 ENCOUNTER — Other Ambulatory Visit: Payer: Self-pay | Admitting: Obstetrics and Gynecology

## 2012-08-06 ENCOUNTER — Ambulatory Visit (INDEPENDENT_AMBULATORY_CARE_PROVIDER_SITE_OTHER): Payer: Medicaid Other | Admitting: Obstetrics and Gynecology

## 2012-08-06 VITALS — BP 115/73 | HR 63 | Temp 97.2°F | Ht 65.0 in | Wt 166.7 lb

## 2012-08-06 DIAGNOSIS — O360990 Maternal care for other rhesus isoimmunization, unspecified trimester, not applicable or unspecified: Secondary | ICD-10-CM

## 2012-08-06 DIAGNOSIS — Z3481 Encounter for supervision of other normal pregnancy, first trimester: Secondary | ICD-10-CM

## 2012-08-06 DIAGNOSIS — D069 Carcinoma in situ of cervix, unspecified: Secondary | ICD-10-CM | POA: Insufficient documentation

## 2012-08-06 DIAGNOSIS — Z3490 Encounter for supervision of normal pregnancy, unspecified, unspecified trimester: Secondary | ICD-10-CM | POA: Insufficient documentation

## 2012-08-06 LAB — HIV ANTIBODY (ROUTINE TESTING W REFLEX): HIV: NONREACTIVE

## 2012-08-06 NOTE — Progress Notes (Signed)
Patient ID: Alexandria [name], female   DOB: [birth year], 27 y.o.   MRN: [id number] 27 yo [id number] with LMP 2/25 presenting today as a referral from [facility] department for evaluation of abnormal pap smear and colposcopy. Patient had ASCUS pap smear with + HPV in 03/2012. 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 Patient recently found out she is pregnant. She is unsure of her LMP. Patient has not initiated [facility] as she is waiting on her Medicaid.  GENERAL: Well-developed, well-nourished female in no acute distress.  HEENT: Normocephalic, atraumatic. Sclerae anicteric.  NECK: Supple. Normal thyroid.  LUNGS: Clear to auscultation bilaterally.  HEART: Regular rate and rhythm. BREASTS: Symmetric in size. No palpable masses or lymphadenopathy, skin changes, or nipple drainage. ABDOMEN: Soft, nontender, nondistended. No organomegaly. PELVIC: Normal external female genitalia. Vagina is pink and rugated.  Normal discharge. Normal appearing cervix. Uterus is normal in size.  No adnexal mass or tenderness. EXTREMITIES: No cyanosis, clubbing, or edema, 2+ distal pulses.  A/P 27 yo [id number] at unknown gestational age - Treatment optionof CIN III discussed with patient who wishes to delay treatment until postpartum. Discussed other option to repeat pap smear with colpo q 12 weeks to monitor disease progression. Patient remains undecided but visibly anxious. - Will obtain ob panel and dating ultrasound - Patient to return for first ob visit as soon as possible

## 2012-08-06 NOTE — Patient Instructions (Addendum)
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 If you wish to delay LEEP until after you have the baby, we can repeat the pap smear several times during pregnancy to make sure that it doesn't get worst.  Abnormal Pap Test Information During a Pap test, the cells on the surface of your cervix are checked to see if they look normal, abnormal, or if they show signs of having been altered by a certain type of virus called human papillomavirus, or HPV. Cervical cells that have been affected by HPV are called dysplasia. Dysplasia is not cancer, but describes abnormal cells found on the surface of the cervix. Depending on the degree of dysplasia, some of the cells may be considered pre-cancerous and may turn into cancer over time if follow up with a caregiver is delayed.  WHAT DOES AN ABNORMAL PAP TEST MEAN? Having an abnormal pap test does not mean that you have cancer. However, certain types of abnormal pap tests can be a sign that a person is at a higher risk of developing cancer. Your caregiver will want to do other tests to find out more about the abnormal cells. Your abnormal Pap test results could show:   Small and uncertain changes that should be carefully watched.   Cervical dysplasia that has caused mild changes and can be followed over time.  Cervical dysplasia that is more severe and needs to be followed and treated to ensure the problem goes away.  Cancer.  When severe cervical dysplasia is found and treated early, it rarely will grow into cancer.  WHAT WILL BE DONE ABOUT MY ABNORMAL PAP TEST?  A colposcopy may be needed. This is a procedure where your cervix is examined using light and magnification.  A small tissue sample of your cervix (biopsy) may need to be removed and then examined. This is often performed if there are areas that appear infected.  A sample of cells from the cervical canal may be removed with either a small brush or scraping instrument (curette). Based on the  results of the procedures above, some caregivers may recommend either cryotherapy of the cervix or a surgical LEEP where a portion of the cervix is removed. LEEP is short for "loop electrical excisional procedure." Rarely, a caregiver may recommend a cone biopsy.This is a procedure where a small, cone-shaped sample of your cervix is taken out. The part that is taken out is the area where the abnormal cells are.  WHAT IF I HAVE A DYSPLASIA OR A CANCER? You may be referred to a specialist. Radiation may also be a treatment for more advanced cancer. Having a hysterectomy is the last treatment option for dysplasia, but it is a more common treatment for someone with cancer. All treatment options will be discussed with you by your caregiver. WHAT SHOULD YOU DO AFTER BEING TREATED? If you have had an abnormal pap test, you should continue to have regular pap tests and check-ups as directed by your caregiver. Your cervical problem will be carefully watched so it does not get worse. Also, your caregiver can watch for, and treat, any new problems that may come up. Document Released: 07/30/2010 Document Revised: 07/07/2011 Document Reviewed: 04/10/2011 [facility] Patient Information 2013 [place], [place].  Pregnant women - Pregnant women with cervical intraepithelial neoplasia 1 (CIN 1) should not undergo cervical excision or ablation, regardless of the duration of the abnormality and irrespective of whether the antecedent tests were high-grade (atypical squamous cells, cannot rule out high grade squamous intraepithelial lesion [  ASC-H] or high-grade squamous intraepithelial lesion [HSIL]). The patient should be reevaluated six weeks postpartum and managed based on those results.  For pregnant women with CIN 2,3, if invasive disease is not suspected, there are two options for follow-up [2,38-40]: ?Repeat evaluation with cytology and colposcopy during the pregnancy, but not more often than every 12 weeks. A biopsy may  be repeated only if the appearance of the lesion worsens or if cytology suggests invasive disease. Endocervical sampling with a curette and endometrial sampling should NOT be performed, as there is a risk of disturbing the pregnancy.  ?Alternatively, reevaluation may be deferred until six weeks postpartum.  A diagnostic excisional procedure is performed only if invasive disease is suspected. (See "Cervical cancer in pregnancy".) High-grade lesions discovered during pregnancy have a high rate of regression in the postpartum period. As an example, in one study, 70 percent of 153 women with CIN 3 had regression and none progressed to invasive carcinoma [38]. This underscores the role for conservative antepartum management followed by careful postpartum evaluation. Furthermore, the morbidity associated with cervical conization during pregnancy is substantial.

## 2012-08-09 ENCOUNTER — Encounter: Payer: Self-pay | Admitting: Obstetrics and Gynecology

## 2012-08-09 DIAGNOSIS — Z6791 Unspecified blood type, Rh negative: Secondary | ICD-10-CM | POA: Insufficient documentation

## 2012-08-09 DIAGNOSIS — O26899 Other specified pregnancy related conditions, unspecified trimester: Secondary | ICD-10-CM

## 2012-08-09 LAB — OBSTETRIC PANEL
Hemoglobin: 14 g/dL (ref 12.0–15.0)
Hepatitis B Surface Ag: NEGATIVE
Lymphs Abs: 2.3 10*3/uL (ref 0.7–4.0)
Monocytes Relative: 8 % (ref 3–12)
Neutro Abs: 3.9 10*3/uL (ref 1.7–7.7)
Neutrophils Relative %: 58 % (ref 43–77)
Platelets: 273 10*3/uL (ref 150–400)
RBC: 4.74 MIL/uL (ref 3.87–5.11)
Rh Type: NEGATIVE
WBC: 6.9 10*3/uL (ref 4.0–10.5)

## 2012-08-10 LAB — CULTURE, OB URINE

## 2012-08-12 ENCOUNTER — Encounter (HOSPITAL_COMMUNITY): Payer: Self-pay

## 2012-08-12 ENCOUNTER — Ambulatory Visit (HOSPITAL_COMMUNITY)
Admission: RE | Admit: 2012-08-12 | Discharge: 2012-08-12 | Disposition: A | Discharge status: Home / Self Care | Payer: Medicaid Other | Source: Ambulatory Visit | Attending: Obstetrics and Gynecology | Admitting: Obstetrics and Gynecology

## 2012-08-12 DIAGNOSIS — Z3481 Encounter for supervision of other normal pregnancy, first trimester: Secondary | ICD-10-CM

## 2012-08-12 DIAGNOSIS — Z3689 Encounter for other specified antenatal screening: Secondary | ICD-10-CM | POA: Insufficient documentation

## 2012-08-16 ENCOUNTER — Encounter: Payer: Self-pay | Admitting: *Deleted

## 2012-09-15 ENCOUNTER — Encounter: Payer: Self-pay | Admitting: Family Medicine

## 2012-09-21 ENCOUNTER — Inpatient Hospital Stay (HOSPITAL_COMMUNITY)
Admission: AD | Admit: 2012-09-21 | Discharge: 2012-09-21 | Disposition: A | Discharge status: Home / Self Care | Payer: Medicaid Other | Source: Ambulatory Visit | Attending: Obstetrics & Gynecology | Admitting: Obstetrics & Gynecology

## 2012-09-21 ENCOUNTER — Encounter (HOSPITAL_COMMUNITY): Payer: Self-pay | Admitting: *Deleted

## 2012-09-21 DIAGNOSIS — N760 Acute vaginitis: Secondary | ICD-10-CM

## 2012-09-21 DIAGNOSIS — B9689 Other specified bacterial agents as the cause of diseases classified elsewhere: Secondary | ICD-10-CM

## 2012-09-21 DIAGNOSIS — R1032 Left lower quadrant pain: Secondary | ICD-10-CM

## 2012-09-21 DIAGNOSIS — A499 Bacterial infection, unspecified: Secondary | ICD-10-CM | POA: Insufficient documentation

## 2012-09-21 DIAGNOSIS — N949 Unspecified condition associated with female genital organs and menstrual cycle: Secondary | ICD-10-CM

## 2012-09-21 HISTORY — DX: Major depressive disorder, single episode, unspecified: F32.9

## 2012-09-21 HISTORY — DX: Depression, unspecified: F32.A

## 2012-09-21 HISTORY — DX: Anxiety disorder, unspecified: F41.9

## 2012-09-21 LAB — URINALYSIS, ROUTINE W REFLEX MICROSCOPIC
Bilirubin Urine: NEGATIVE
Glucose, UA: NEGATIVE mg/dL
Ketones, ur: 15 mg/dL — AB
Leukocytes, UA: NEGATIVE
Nitrite: NEGATIVE
Specific Gravity, Urine: >1.03 — ABNORMAL HIGH (ref 1.005–1.030)
pH: 6 (ref 5.0–8.0)

## 2012-09-21 LAB — WET PREP, GENITAL: Trich, Wet Prep: NONE SEEN

## 2012-09-21 LAB — OB RESULTS CONSOLE GC/CHLAMYDIA
Chlamydia: NEGATIVE
Gonorrhea: NEGATIVE

## 2012-09-21 MED ORDER — METRONIDAZOLE 500 MG PO TABS: 500.0000 mg | ORAL_TABLET | Freq: Three times a day (TID) | ORAL | Status: DC

## 2012-09-21 NOTE — Progress Notes (Signed)
Written and verbal d/c instructions given and understanding voiced. 

## 2012-09-21 NOTE — MAU Provider Note (Signed)
Chief Complaint: Vaginal Discharge   None    SUBJECTIVE HPI: Alexandria [name] is a 27 y.o. [id number] at [redacted]w[redacted]d by LMP who presents with malodorous vaginal discharge for the past 3 days. Also feeling pelvic pressure and occasional sharp left lower quadrant pains. Pain-free now.   PN course: Seen in [facility] Clinic last month for colpo due to CIN III. NOB scheduled Has applied [facility] and undecided where she will get [facility]  No past medical history on file. OB History   Grav Para Term Preterm Abortions TAB SAB Ect Mult Living   5 2 2  1 1    2      # Outc Date GA Lbr Len/2nd Wgt Sex Del Anes PTL Lv   1 TRM 5/08 [redacted]w[redacted]d  4.540JW(1XB14NW) M SVD EPI     2 TRM 5/10 [redacted]w[redacted]d  2.956OZ(3YQ65HQ) M SVD EPI     3 CUR            4 TAB            5 GRA              No past surgical history on file. History   Social History  . Marital Status: Single    Spouse Name: N/A    Number of Children: N/A  . Years of Education: N/A   Occupational History  . Not on file.   Social History Main Topics  . Smoking status: Never Smoker   . Smokeless tobacco: Not on file  . Alcohol Use: No  . Drug Use: No  . Sexually Active: Yes    Birth Control/ Protection: None   Other Topics Concern  . Not on file   Social History Narrative  . No narrative on file   No current facility-administered medications on file prior to encounter.   Current Outpatient Prescriptions on File Prior to Encounter  Medication Sig Dispense Refill  . gabapentin (NEURONTIN) 300 MG capsule Take 300 mg by mouth 4 (four) times daily.      Marland Kitchen glycopyrrolate (ROBINUL) 2 MG tablet Take 2 mg by mouth 2 (two) times daily.      . hydrOXYzine (ATARAX/VISTARIL) 25 MG tablet Take 1 tablet (25 mg total) by mouth every 6 (six) hours as needed for itching.  20 tablet  0  . OVER THE COUNTER MEDICATION Apply 1 application topically daily as needed. Benadryl creams  For itching.      . permethrin (ELIMITE) 5 % cream Apply 1 application topically 2 (two) times  daily.      . predniSONE (DELTASONE) 20 MG tablet Take 1 tablet (20 mg total) by mouth daily.  5 tablet  0  . sertraline (ZOLOFT) 100 MG tablet Take 50 mg by mouth daily.        No Known Allergies  ROS: Pertinent items in HPI  OBJECTIVE Blood pressure 117/77, pulse 102, temperature 98.2 F (36.8 C), temperature source Oral, resp. rate 18, height 5\' 3"  (1.6 m), weight 76.658 kg (169 lb), last menstrual period 06/22/2012. GENERAL: Well-developed, well-nourished female in no acute distress.  HEENT: Normocephalic HEART: normal rate RESP: normal effort ABDOMEN: Soft, non-tender, fundus 2/3 to U; DT 150.  EXTREMITIES: Nontender, no edema NEURO: Alert and oriented SPECULUM EXAM: NEFG,thin bubbly discharge, no blood noted, cervix clean BIMANUAL: cervix L/C; uterus normal size, no adnexal tenderness or masses  LAB RESULTS Results for orders placed during the hospital encounter of 09/21/12 (from the past 24 hour(s))  URINALYSIS, ROUTINE W REFLEX MICROSCOPIC  Status: Abnormal   Collection Time    09/21/12 11:02 AM      Result Value Range   Color, Urine YELLOW  YELLOW   APPearance CLEAR  CLEAR   Specific Gravity, Urine >1.030 (*) 1.005 - 1.030   pH 6.0  5.0 - 8.0   Glucose, UA NEGATIVE  NEGATIVE mg/dL   Hgb urine dipstick NEGATIVE  NEGATIVE   Bilirubin Urine NEGATIVE  NEGATIVE   Ketones, ur 15 (*) NEGATIVE mg/dL   Protein, ur NEGATIVE  NEGATIVE mg/dL   Urobilinogen, UA 1.0  0.0 - 1.0 mg/dL   Nitrite NEGATIVE  NEGATIVE   Leukocytes, UA NEGATIVE  NEGATIVE  WET PREP, GENITAL     Status: Abnormal   Collection Time    09/21/12 11:43 AM      Result Value Range   Yeast Wet Prep HPF POC NONE SEEN  NONE SEEN   Trich, Wet Prep NONE SEEN  NONE SEEN   Clue Cells Wet Prep HPF POC FEW (*) NONE SEEN   WBC, Wet Prep HPF POC FEW (*) NONE SEEN    IMAGING No results found.  MAU COURSE  ASSESSMENT 1. BV (bacterial vaginosis)     PLAN Discharge home See AVS   Medication List    TAKE  these medications       acetaminophen 325 MG tablet  Commonly known as:  TYLENOL  Take 325 mg by mouth every 6 (six) hours as needed for pain.     busPIRone 10 MG tablet  Commonly known as:  BUSPAR  Take 10 mg by mouth 2 (two) times daily.     metroNIDAZOLE 500 MG tablet  Commonly known as:  FLAGYL  Take 1 tablet (500 mg total) by mouth 3 (three) times daily.     prenatal multivitamin Tabs  Take 1 tablet by mouth daily at 12 noon.       Follow-up Information   Schedule an appointment as soon as possible for a visit to follow up. (See list of prenatal providers)       Danae Orleans, CNM 09/21/2012  10:57 AM

## 2012-09-21 NOTE — MAU Note (Signed)
Thinks has an infection, discharge first noted 3 days ago- has an odor.  Having pressure and pain off and on for past wk, is in LLQ.

## 2012-10-06 ENCOUNTER — Encounter: Payer: Self-pay | Admitting: Obstetrics and Gynecology

## 2012-10-06 ENCOUNTER — Ambulatory Visit (INDEPENDENT_AMBULATORY_CARE_PROVIDER_SITE_OTHER): Payer: Medicaid Other | Admitting: Obstetrics and Gynecology

## 2012-10-06 ENCOUNTER — Encounter: Payer: Self-pay | Admitting: *Deleted

## 2012-10-06 VITALS — BP 110/77 | Temp 98.0°F | Wt 177.0 lb

## 2012-10-06 DIAGNOSIS — O360990 Maternal care for other rhesus isoimmunization, unspecified trimester, not applicable or unspecified: Secondary | ICD-10-CM

## 2012-10-06 DIAGNOSIS — F411 Generalized anxiety disorder: Secondary | ICD-10-CM

## 2012-10-06 DIAGNOSIS — Z3482 Encounter for supervision of other normal pregnancy, second trimester: Secondary | ICD-10-CM

## 2012-10-06 DIAGNOSIS — O99340 Other mental disorders complicating pregnancy, unspecified trimester: Secondary | ICD-10-CM

## 2012-10-06 DIAGNOSIS — F419 Anxiety disorder, unspecified: Secondary | ICD-10-CM

## 2012-10-06 DIAGNOSIS — O360191 Maternal care for anti-D [Rh] antibodies, unspecified trimester, fetus 1: Secondary | ICD-10-CM

## 2012-10-06 LAB — POCT URINALYSIS DIP (DEVICE)
Glucose, UA: NEGATIVE mg/dL
Hgb urine dipstick: NEGATIVE
Nitrite: NEGATIVE
Protein, ur: NEGATIVE mg/dL
Specific Gravity, Urine: 1.025 (ref 1.005–1.030)
Urobilinogen, UA: 1 mg/dL (ref 0.0–1.0)
pH: 6.5 (ref 5.0–8.0)

## 2012-10-06 NOTE — Patient Instructions (Signed)
Pregnancy - Second Trimester The second trimester of pregnancy (3 to 6 months) is a period of rapid growth for you and your baby. At the end of the sixth month, your baby is about 9 inches long and weighs 1 1/2 pounds. You will begin to feel the baby move between 18 and 20 weeks of the pregnancy. This is called quickening. Weight gain is faster. A clear fluid (colostrum) may leak out of your breasts. You may feel small contractions of the womb (uterus). This is known as false labor or Braxton-Hicks contractions. This is like a practice for labor when the baby is ready to be born. Usually, the problems with morning sickness have usually passed by the end of your first trimester. Some women develop small dark blotches (called cholasma, mask of pregnancy) on their face that usually goes away after the baby is born. Exposure to the sun makes the blotches worse. Acne may also develop in some pregnant women and pregnant women who have acne, may find that it goes away. PRENATAL EXAMS  Blood work may continue to be done during prenatal exams. These tests are done to check on your health and the probable health of your baby. Blood work is used to follow your blood levels (hemoglobin). Anemia (low hemoglobin) is common during pregnancy. Iron and vitamins are given to help prevent this. You will also be checked for diabetes between 24 and 28 weeks of the pregnancy. Some of the previous blood tests may be repeated.  The size of the uterus is measured during each visit. This is to make sure that the baby is continuing to grow properly according to the dates of the pregnancy.  Your blood pressure is checked every prenatal visit. This is to make sure you are not getting toxemia.  Your urine is checked to make sure you do not have an infection, diabetes or protein in the urine.  Your weight is checked often to make sure gains are happening at the suggested rate. This is to ensure that both you and your baby are  growing normally.  Sometimes, an ultrasound is performed to confirm the proper growth and development of the baby. This is a test which bounces harmless sound waves off the baby so your caregiver can more accurately determine due dates. Sometimes, a test is done on the amniotic fluid surrounding the baby. This test is called an amniocentesis. The amniotic fluid is obtained by sticking a needle into the belly (abdomen). This is done to check the chromosomes in instances where there is a concern about possible genetic problems with the baby. It is also sometimes done near the end of pregnancy if an early delivery is required. In this case, it is done to help make sure the baby's lungs are mature enough for the baby to live outside of the womb. CHANGES OCCURING IN THE SECOND TRIMESTER OF PREGNANCY Your body goes through many changes during pregnancy. They vary from person to person. Talk to your caregiver about changes you notice that you are concerned about.  During the second trimester, you will likely have an increase in your appetite. It is normal to have cravings for certain foods. This varies from person to person and pregnancy to pregnancy.  Your lower abdomen will begin to bulge.  You may have to urinate more often because the uterus and baby are pressing on your bladder. It is also common to get more bladder infections during pregnancy. You can help this by drinking lots of fluids   and emptying your bladder before and after intercourse.  You may begin to get stretch marks on your hips, abdomen, and breasts. These are normal changes in the body during pregnancy. There are no exercises or medicines to take that prevent this change.  You may begin to develop swollen and bulging veins (varicose veins) in your legs. Wearing support hose, elevating your feet for 15 minutes, 3 to 4 times a day and limiting salt in your diet helps lessen the problem.  Heartburn may develop as the uterus grows and  pushes up against the stomach. Antacids recommended by your caregiver helps with this problem. Also, eating smaller meals 4 to 5 times a day helps.  Constipation can be treated with a stool softener or adding bulk to your diet. Drinking lots of fluids, and eating vegetables, fruits, and whole grains are helpful.  Exercising is also helpful. If you have been very active up until your pregnancy, most of these activities can be continued during your pregnancy. If you have been less active, it is helpful to start an exercise program such as walking.  Hemorrhoids may develop at the end of the second trimester. Warm sitz baths and hemorrhoid cream recommended by your caregiver helps hemorrhoid problems.  Backaches may develop during this time of your pregnancy. Avoid heavy lifting, wear low heal shoes, and practice good posture to help with backache problems.  Some pregnant women develop tingling and numbness of their hand and fingers because of swelling and tightening of ligaments in the wrist (carpel tunnel syndrome). This goes away after the baby is born.  As your breasts enlarge, you may have to get a bigger bra. Get a comfortable, cotton, support bra. Do not get a nursing bra until the last month of the pregnancy if you will be nursing the baby.  You may get a dark line from your belly button to the pubic area called the linea nigra.  You may develop rosy cheeks because of increase blood flow to the face.  You may develop spider looking lines of the face, neck, arms, and chest. These go away after the baby is born. HOME CARE INSTRUCTIONS   It is extremely important to avoid all smoking, herbs, alcohol, and unprescribed drugs during your pregnancy. These chemicals affect the formation and growth of the baby. Avoid these chemicals throughout the pregnancy to ensure the delivery of a healthy infant.  Most of your home care instructions are the same as suggested for the first trimester of your  pregnancy. Keep your caregiver's appointments. Follow your caregiver's instructions regarding medicine use, exercise, and diet.  During pregnancy, you are providing food for you and your baby. Continue to eat regular, well-balanced meals. Choose foods such as meat, fish, milk and other low fat dairy products, vegetables, fruits, and whole-grain breads and cereals. Your caregiver will tell you of the ideal weight gain.  A physical sexual relationship may be continued up until near the end of pregnancy if there are no other problems. Problems could include early (premature) leaking of amniotic fluid from the membranes, vaginal bleeding, abdominal pain, or other medical or pregnancy problems.  Exercise regularly if there are no restrictions. Check with your caregiver if you are unsure of the safety of some of your exercises. The greatest weight gain will occur in the last 2 trimesters of pregnancy. Exercise will help you:  Control your weight.  Get you in shape for labor and delivery.  Lose weight after you have the baby.  Wear   a good support or jogging bra for breast tenderness during pregnancy. This may help if worn during sleep. Pads or tissues may be used in the bra if you are leaking colostrum.  Do not use hot tubs, steam rooms or saunas throughout the pregnancy.  Wear your seat belt at all times when driving. This protects you and your baby if you are in an accident.  Avoid raw meat, uncooked cheese, cat litter boxes, and soil used by cats. These carry germs that can cause birth defects in the baby.  The second trimester is also a good time to visit your dentist for your dental health if this has not been done yet. Getting your teeth cleaned is okay. Use a soft toothbrush. Brush gently during pregnancy.  It is easier to leak urine during pregnancy. Tightening up and strengthening the pelvic muscles will help with this problem. Practice stopping your urination while you are going to the  bathroom. These are the same muscles you need to strengthen. It is also the muscles you would use as if you were trying to stop from passing gas. You can practice tightening these muscles up 10 times a set and repeating this about 3 times per day. Once you know what muscles to tighten up, do not perform these exercises during urination. It is more likely to contribute to an infection by backing up the urine.  Ask for help if you have financial, counseling, or nutritional needs during pregnancy. Your caregiver will be able to offer counseling for these needs as well as refer you for other special needs.  Your skin may become oily. If so, wash your face with mild soap, use non-greasy moisturizer and oil or cream based makeup. MEDICINES AND DRUG USE IN PREGNANCY  Take prenatal vitamins as directed. The vitamin should contain 1 milligram of folic acid. Keep all vitamins out of reach of children. Only a couple vitamins or tablets containing iron may be fatal to a baby or young child when ingested.  Avoid use of all medicines, including herbs, over-the-counter medicines, not prescribed or suggested by your caregiver. Only take over-the-counter or prescription medicines for pain, discomfort, or fever as directed by your caregiver. Do not use aspirin.  Let your caregiver also know about herbs you may be using.  Alcohol is related to a number of birth defects. This includes fetal alcohol syndrome. All alcohol, in any form, should be avoided completely. Smoking will cause low birth rate and premature babies.  Street or illegal drugs are very harmful to the baby. They are absolutely forbidden. A baby born to an addicted mother will be addicted at birth. The baby will go through the same withdrawal an adult does. SEEK MEDICAL CARE IF:  You have any concerns or worries during your pregnancy. It is better to call with your questions if you feel they cannot wait, rather than worry about them. SEEK IMMEDIATE  MEDICAL CARE IF:   An unexplained oral temperature above 102 F (38.9 C) develops, or as your caregiver suggests.  You have leaking of fluid from the vagina (birth canal). If leaking membranes are suspected, take your temperature and tell your caregiver of this when you call.  There is vaginal spotting, bleeding, or passing clots. Tell your caregiver of the amount and how many pads are used. Light spotting in pregnancy is common, especially following intercourse.  You develop a bad smelling vaginal discharge with a change in the color from clear to white.  You continue to feel   sick to your stomach (nauseated) and have no relief from remedies suggested. You vomit blood or coffee ground-like materials.  You lose more than 2 pounds of weight or gain more than 2 pounds of weight over 1 week, or as suggested by your caregiver.  You notice swelling of your face, hands, feet, or legs.  You get exposed to German measles and have never had them.  You are exposed to fifth disease or chickenpox.  You develop belly (abdominal) pain. Round ligament discomfort is a common non-cancerous (benign) cause of abdominal pain in pregnancy. Your caregiver still must evaluate you.  You develop a bad headache that does not go away.  You develop fever, diarrhea, pain with urination, or shortness of breath.  You develop visual problems, blurry, or double vision.  You fall or are in a car accident or any kind of trauma.  There is mental or physical violence at home. Document Released: 04/08/2001 Document Revised: 01/07/2012 Document Reviewed: 10/11/2008 Exit[employer] Patient Information 2014 [employer].  

## 2012-10-06 NOTE — Progress Notes (Signed)
Pulse: 101 Discussed recommended weight gain of 15-25 lbs.

## 2012-10-08 NOTE — Progress Notes (Signed)
   Subjective:    Alexandria [name] is a [id number] [redacted]w[redacted]d being seen today for her first obstetrical visit.  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  Plan is to treat postpartum and she does elect to have serial Paps and colpos during pregnancy to monitor progression.  S[place]oes intend to breast feed. Pregnancy history fully reviewed.  Patient reports anxiety controlled. Ceasar Mons Vitals:   10/06/12 0911  BP: 110/77  Temp: 98 F (36.7 C)  Weight: 80.287 kg (177 lb)    HISTORY: OB History   Grav Para Term Preterm Abortions TAB SAB Ect Mult Living   4 2 2  1 1    2      # Outc Date GA Lbr Len/2nd Wgt Sex Del Anes PTL Lv   1 TRM 5/08 [redacted]w[redacted]d  9.811BJ([id number]) M SVD EPI     2 TRM 5/10 [redacted]w[redacted]d  2.130QM([id number]) M SVD EPI     3 TAB            4 CUR              Past Medical History  Diagnosis Date  . Depression   . Anxiety    Past Surgical History  Procedure Laterality Date  . Dilation and curettage of uterus     Family History  Problem Relation Age of Onset  . Cancer Mother   . Cancer Maternal Aunt   . Cancer Paternal Aunt      Exam    Uterus:     Pelvic Exam:    Perineum: Normal Perineum   Vulva: normal   Vagina:  normal mucosa, normal discharge       Cervix: multiparous appearance and thick/closed   Adnexa: not evaluated   Bony Pelvis: gynecoid  System: Breast:  normal appearance, no masses or tenderness   Skin: normal coloration and turgor, no rashes    Neurologic: oriented, normal, grossly non-focal   Extremities: no erythema, induration, or nodules, gait was normal for age   HEENT PERRLA, neck supple with midline trachea and thyroid without masses   Mouth/Teeth mucous membranes moist, pharynx normal without lesions   Neck supple   Cardiovascular: regular rate and rhythm, no murmurs or gallops   Respiratory:  appears well, vitals normal, no respiratory distress, acyanotic, normal RR, ear and throat exam is normal, neck free of  mass or lymphadenopathy, chest clear, no wheezing, crepitations, rhonchi, normal symmetric air entry   Abdomen: NT, S=D, DT FHR 150   Urinary: urethral meatus normal      Assessment:    Pregnancy: [id number] Patient Active Problem List   Diagnosis Date Noted  . Anxiety 10/06/2012  . Rh negative state in antepartum period 08/09/2012  . CIN III (cervical intraepithelial neoplasia grade III) with severe dysplasia 08/06/2012  . Supervision of other normal pregnancy 08/06/2012        Plan:     Initial labs drawn. Prenatal vitamins. Problem list reviewed and updated. Genetic Screening discussed Quad Screen: ordered.  Ultrasound discussed; fetal survey: ordered.  Follow up in 4 weeks. 50% of 30 min visit spent on counseling and coordination of care.  Colpo and Pap in 4 wks    [name] 10/08/2012

## 2012-10-10 ENCOUNTER — Inpatient Hospital Stay (HOSPITAL_COMMUNITY)
Admission: AD | Admit: 2012-10-10 | Discharge: 2012-10-10 | Disposition: A | Discharge status: Home / Self Care | Payer: Medicaid Other | Source: Ambulatory Visit | Attending: Obstetrics and Gynecology | Admitting: Obstetrics and Gynecology

## 2012-10-10 ENCOUNTER — Encounter (HOSPITAL_COMMUNITY): Payer: Self-pay | Admitting: *Deleted

## 2012-10-10 DIAGNOSIS — R82998 Other abnormal findings in urine: Secondary | ICD-10-CM | POA: Insufficient documentation

## 2012-10-10 DIAGNOSIS — N949 Unspecified condition associated with female genital organs and menstrual cycle: Secondary | ICD-10-CM | POA: Insufficient documentation

## 2012-10-10 DIAGNOSIS — R8271 Bacteriuria: Secondary | ICD-10-CM

## 2012-10-10 DIAGNOSIS — R109 Unspecified abdominal pain: Secondary | ICD-10-CM | POA: Insufficient documentation

## 2012-10-10 DIAGNOSIS — O99891 Other specified diseases and conditions complicating pregnancy: Secondary | ICD-10-CM | POA: Insufficient documentation

## 2012-10-10 LAB — URINE MICROSCOPIC-ADD ON

## 2012-10-10 LAB — URINALYSIS, ROUTINE W REFLEX MICROSCOPIC
Glucose, UA: NEGATIVE mg/dL
Ketones, ur: 15 mg/dL — AB
Protein, ur: 30 mg/dL — AB
Urobilinogen, UA: 4 mg/dL — ABNORMAL HIGH (ref 0.0–1.0)

## 2012-10-10 MED ORDER — NITROFURANTOIN MONOHYD MACRO 100 MG PO CAPS: 100.0000 mg | ORAL_CAPSULE | Freq: Two times a day (BID) | ORAL | Status: DC

## 2012-10-10 NOTE — Discharge Instructions (Signed)
Urinary Tract Infection  Urinary tract infections (UTIs) can develop anywhere along your urinary tract. Your urinary tract is your body's drainage system for removing wastes and extra water. Your urinary tract includes two kidneys, two ureters, a bladder, and a urethra. Your kidneys are a pair of bean-shaped organs. Each kidney is about the size of your fist. They are located below your ribs, one on each side of your spine.  CAUSES  Infections are caused by microbes, which are microscopic organisms, including fungi, viruses, and bacteria. These organisms are so small that they can only be seen through a microscope. Bacteria are the microbes that most commonly cause UTIs.  SYMPTOMS   Symptoms of UTIs may vary by age and gender of the patient and by the location of the infection. Symptoms in young women typically include a frequent and intense urge to urinate and a painful, burning feeling in the bladder or urethra during urination. Older women and men are more likely to be tired, shaky, and weak and have muscle aches and abdominal pain. A fever may mean the infection is in your kidneys. Other symptoms of a kidney infection include pain in your back or sides below the ribs, nausea, and vomiting.  DIAGNOSIS  To diagnose a UTI, your caregiver will ask you about your symptoms. Your caregiver also will ask to provide a urine sample. The urine sample will be tested for bacteria and white blood cells. White blood cells are made by your body to help fight infection.  TREATMENT   Typically, UTIs can be treated with medication. Because most UTIs are caused by a bacterial infection, they usually can be treated with the use of antibiotics. The choice of antibiotic and length of treatment depend on your symptoms and the type of bacteria causing your infection.  HOME CARE INSTRUCTIONS   If you were prescribed antibiotics, take them exactly as your caregiver instructs you. Finish the medication even if you feel better after you  have only taken some of the medication.   Drink enough water and fluids to keep your urine clear or pale yellow.   Avoid caffeine, tea, and carbonated beverages. They tend to irritate your bladder.   Empty your bladder often. Avoid holding urine for long periods of time.   Empty your bladder before and after sexual intercourse.   After a bowel movement, women should cleanse from front to back. Use each tissue only once.  SEEK MEDICAL CARE IF:    You have back pain.   You develop a fever.   Your symptoms do not begin to resolve within 3 days.  SEEK IMMEDIATE MEDICAL CARE IF:    You have severe back pain or lower abdominal pain.   You develop chills.   You have nausea or vomiting.   You have continued burning or discomfort with urination.  MAKE SURE YOU:    Understand these instructions.   Will watch your condition.   Will get help right away if you are not doing well or get worse.  Document Released: 01/22/2005 Document Revised: 10/14/2011 Document Reviewed: 05/23/2011  [employer] Patient Information 2014 [employer].

## 2012-10-10 NOTE — Progress Notes (Signed)
Written and verbal d/c instructions given and understanding voiced. 

## 2012-10-10 NOTE — MAU Provider Note (Signed)
Attestation of Attending Supervision of Advanced Practitioner (CNM/NP): Evaluation and management procedures were performed by the Advanced Practitioner under my supervision and collaboration.  I have reviewed the Advanced Practitioner's note and chart, and I agree with the management and plan.  Ellisyn Icenhower 10/10/2012 7:20 AM

## 2012-10-10 NOTE — MAU Provider Note (Signed)
History     CSN: [id number]  Arrival date and time: 10/10/12 0006   None     Chief Complaint  Patient presents with  . Abdominal Pain   HPI  Pt is a G4P2012 at 16 wk IUP here with abdominal cramping since 1300.  Reports it feels like cramps.  No vaginal bleeding reported.  +white discharge.  Pt reports concern due to having dark urine.  No UTI symptoms.  Recently treated for bacterial vaginosis.  GC/CT negative.     Past Medical History  Diagnosis Date  . Depression   . Anxiety     Past Surgical History  Procedure Laterality Date  . Dilation and curettage of uterus      Family History  Problem Relation Age of Onset  . Cancer Mother   . Cancer Maternal Aunt   . Cancer Paternal Aunt     History  Substance Use Topics  . Smoking status: Never Smoker   . Smokeless tobacco: Not on file  . Alcohol Use: No    Allergies: No Known Allergies  Prescriptions prior to admission  Medication Sig Dispense Refill  . acetaminophen (TYLENOL) 325 MG tablet Take 650 mg by mouth every 6 (six) hours as needed for pain.       Marland Kitchen gabapentin (NEURONTIN) 300 MG capsule Take 300 mg by mouth daily.      . Prenatal Vit-Fe Fumarate-FA (PRENATAL MULTIVITAMIN) TABS Take 1 tablet by mouth daily at 12 noon.      . busPIRone (BUSPAR) 10 MG tablet Take 10 mg by mouth 2 (two) times daily.      . metroNIDAZOLE (FLAGYL) 500 MG tablet Take 1 tablet (500 mg total) by mouth 3 (three) times daily.  14 tablet  0    Review of Systems  Gastrointestinal: Positive for abdominal pain (cramping).  Genitourinary: Negative.   All other systems reviewed and are negative.   Physical Exam   Blood pressure 121/71, pulse 100, temperature 97.6 F (36.4 C), resp. rate 18, height 5\' 4"  (1.626 m), weight 81.103 kg (178 lb 12.8 oz), last menstrual period 06/22/2012, SpO2 100.00%.  Physical Exam  Constitutional: She is oriented to person, place, and time. She appears well-developed and well-nourished. No distress.   HENT:  Head: Normocephalic.  Neck: Normal range of motion. Neck supple.  Cardiovascular: Normal rate, regular rhythm and normal heart sounds.   Respiratory: Effort normal and breath sounds normal.  GI: Soft. There is no tenderness.  Genitourinary: No bleeding around the vagina. No vaginal discharge found.  Musculoskeletal: Normal range of motion. She exhibits no edema.  Neurological: She is alert and oriented to person, place, and time.  Skin: Skin is warm and dry.   Dilation: Closed Effacement (%): Thick Cervical Position: Posterior Exam by:: Roney Marion, CNM  MAU Course  Procedures  Results for orders placed during the hospital encounter of 10/10/12 (from the past 24 hour(s))  URINALYSIS, ROUTINE W REFLEX MICROSCOPIC     Status: Abnormal   Collection Time    10/10/12 12:15 AM      Result Value Range   Color, Urine YELLOW  YELLOW   APPearance CLOUDY (*) CLEAR   Specific Gravity, Urine >1.030 (*) 1.005 - 1.030   pH 5.5  5.0 - 8.0   Glucose, UA NEGATIVE  NEGATIVE mg/dL   Hgb urine dipstick LARGE (*) NEGATIVE   Bilirubin Urine SMALL (*) NEGATIVE   Ketones, ur 15 (*) NEGATIVE mg/dL   Protein, ur 30 (*) NEGATIVE mg/dL  Urobilinogen, UA 4.0 (*) 0.0 - 1.0 mg/dL   Nitrite NEGATIVE  NEGATIVE   Leukocytes, UA TRACE (*) NEGATIVE  URINE MICROSCOPIC-ADD ON     Status: Abnormal   Collection Time    10/10/12 12:15 AM      Result Value Range   Squamous Epithelial / LPF RARE  RARE   WBC, UA 0-2  <3 WBC/hpf   RBC / HPF TOO NUMEROUS TO COUNT  <3 RBC/hpf   Bacteria, UA FEW (*) RARE   Urine-Other MUCOUS PRESENT       Assessment and Plan  Bacteriuria Pelvic Pain in Pregnancy - normal exam  Plan: DC to home RX Macrobid Urine culture sent Follow-up prn   [facility] 10/10/2012, 12:46 AM

## 2012-10-10 NOTE — MAU Note (Signed)
Abdominal cramping since 1300 which has gotten stronger. Feels like period cramps. NO bleeding

## 2012-10-11 LAB — URINE CULTURE: Colony Count: NO GROWTH

## 2012-10-13 ENCOUNTER — Encounter: Payer: Self-pay | Admitting: *Deleted

## 2012-10-26 ENCOUNTER — Ambulatory Visit (INDEPENDENT_AMBULATORY_CARE_PROVIDER_SITE_OTHER): Payer: Medicaid Other | Admitting: Family Medicine

## 2012-10-26 ENCOUNTER — Encounter: Payer: Self-pay | Admitting: Family Medicine

## 2012-10-26 VITALS — BP 120/80 | HR 78 | Temp 98.2°F | Resp 20 | Wt 182.0 lb

## 2012-10-26 DIAGNOSIS — R61 Generalized hyperhidrosis: Secondary | ICD-10-CM | POA: Insufficient documentation

## 2012-10-26 DIAGNOSIS — D069 Carcinoma in situ of cervix, unspecified: Secondary | ICD-10-CM

## 2012-10-26 DIAGNOSIS — L74519 Primary focal hyperhidrosis, unspecified: Secondary | ICD-10-CM

## 2012-10-26 DIAGNOSIS — F411 Generalized anxiety disorder: Secondary | ICD-10-CM

## 2012-10-26 DIAGNOSIS — F419 Anxiety disorder, unspecified: Secondary | ICD-10-CM

## 2012-10-26 NOTE — Assessment & Plan Note (Signed)
Currently on neurontin doing well, followed by psychiatry

## 2012-10-26 NOTE — Assessment & Plan Note (Signed)
CIN III , reviewed gyn note, will plan for intervention s/p pregnancy

## 2012-10-26 NOTE — Patient Instructions (Addendum)
Referral to Dr. [facility]  Continue current medications  Release [facility] [place] [facility]  F/U as needed
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
## 2012-10-26 NOTE — Assessment & Plan Note (Signed)
Discussed robinul tablets are Cat C, I will not start today WIll refer her back to dermatology for other possibilites while pregnant, she may have to delay treatment

## 2012-10-27 ENCOUNTER — Ambulatory Visit (HOSPITAL_COMMUNITY)
Admission: RE | Admit: 2012-10-27 | Discharge: 2012-10-27 | Disposition: A | Discharge status: Home / Self Care | Payer: Medicaid Other | Source: Ambulatory Visit | Attending: Obstetrics and Gynecology | Admitting: Obstetrics and Gynecology

## 2012-10-27 ENCOUNTER — Other Ambulatory Visit: Payer: Self-pay | Admitting: Obstetrics and Gynecology

## 2012-10-27 DIAGNOSIS — O3440 Maternal care for other abnormalities of cervix, unspecified trimester: Secondary | ICD-10-CM | POA: Insufficient documentation

## 2012-10-27 DIAGNOSIS — N879 Dysplasia of cervix uteri, unspecified: Secondary | ICD-10-CM | POA: Insufficient documentation

## 2012-10-27 DIAGNOSIS — F419 Anxiety disorder, unspecified: Secondary | ICD-10-CM

## 2012-10-27 DIAGNOSIS — Z3689 Encounter for other specified antenatal screening: Secondary | ICD-10-CM | POA: Insufficient documentation

## 2012-10-27 DIAGNOSIS — O360191 Maternal care for anti-D [Rh] antibodies, unspecified trimester, fetus 1: Secondary | ICD-10-CM

## 2012-10-28 ENCOUNTER — Telehealth: Payer: Self-pay | Admitting: General Practice

## 2012-10-28 DIAGNOSIS — O4402 Placenta previa specified as without hemorrhage, second trimester: Secondary | ICD-10-CM | POA: Insufficient documentation

## 2012-10-28 NOTE — Telephone Encounter (Signed)
Message copied by Kathee Delton on Thu Oct 28, 2012  2:12 PM ------      Message from: POE, DEIRDRE C      Created: Thu Oct 28, 2012  1:47 PM       Previa 19 wk: call with advice pelvic rest, report if bleed ------

## 2012-10-28 NOTE — Telephone Encounter (Signed)
Patient called back and I informed her of results/recommendations. Patient was concerned about something being wrong with the baby. Told patient the baby is normal and healthy but this was specifically related to where her placenta was attached and to not worry about this for now and that sometimes it resolves on its own or moves up farther in the uterus and to just come with questions to her appt next week and the provider can go over things with her then and answer any additional questions at that time and the plan of care. Patient was relieved, verbalized understanding to all and had no further questions.

## 2012-10-28 NOTE — Telephone Encounter (Signed)
Called patient to inform her of results- patient was very busy at the time and said she would call right back.

## 2012-11-03 ENCOUNTER — Encounter: Payer: Self-pay | Admitting: Advanced Practice Midwife

## 2012-11-09 ENCOUNTER — Encounter: Payer: Self-pay | Admitting: Obstetrics & Gynecology

## 2012-11-09 ENCOUNTER — Other Ambulatory Visit (HOSPITAL_COMMUNITY)
Admission: RE | Admit: 2012-11-09 | Discharge: 2012-11-09 | Disposition: A | Discharge status: Home / Self Care | Payer: Medicaid Other | Source: Ambulatory Visit | Attending: Obstetrics & Gynecology | Admitting: Obstetrics & Gynecology

## 2012-11-09 ENCOUNTER — Ambulatory Visit (INDEPENDENT_AMBULATORY_CARE_PROVIDER_SITE_OTHER): Payer: Medicaid Other | Admitting: Obstetrics & Gynecology

## 2012-11-09 VITALS — BP 105/70 | Wt 178.6 lb

## 2012-11-09 DIAGNOSIS — Z01419 Encounter for gynecological examination (general) (routine) without abnormal findings: Secondary | ICD-10-CM | POA: Insufficient documentation

## 2012-11-09 DIAGNOSIS — O4402 Placenta previa specified as without hemorrhage, second trimester: Secondary | ICD-10-CM

## 2012-11-09 DIAGNOSIS — N760 Acute vaginitis: Secondary | ICD-10-CM

## 2012-11-09 DIAGNOSIS — O4400 Placenta previa specified as without hemorrhage, unspecified trimester: Secondary | ICD-10-CM

## 2012-11-09 DIAGNOSIS — O0992 Supervision of high risk pregnancy, unspecified, second trimester: Secondary | ICD-10-CM

## 2012-11-09 DIAGNOSIS — D069 Carcinoma in situ of cervix, unspecified: Secondary | ICD-10-CM

## 2012-11-09 DIAGNOSIS — O2390 Unspecified genitourinary tract infection in pregnancy, unspecified trimester: Secondary | ICD-10-CM

## 2012-11-09 DIAGNOSIS — N898 Other specified noninflammatory disorders of vagina: Secondary | ICD-10-CM

## 2012-11-09 DIAGNOSIS — O26892 Other specified pregnancy related conditions, second trimester: Secondary | ICD-10-CM

## 2012-11-09 DIAGNOSIS — O9989 Other specified diseases and conditions complicating pregnancy, childbirth and the puerperium: Secondary | ICD-10-CM

## 2012-11-09 LAB — POCT URINALYSIS DIP (DEVICE)
Glucose, UA: NEGATIVE mg/dL
Nitrite: NEGATIVE
Protein, ur: NEGATIVE mg/dL
Urobilinogen, UA: 0.2 mg/dL (ref 0.0–1.0)

## 2012-11-09 NOTE — Progress Notes (Signed)
Patient was told about her complete placenta previa; pelvic rest and bleeding precautions reviewed.  Follow up scan ordered at [facility].  Also complains of foul odorous discharge, scant white discharge seen on exam, wet prep sent.  Pap repeated given than patient has untreated CIN III; will follow up results and manage accordingly.  Patient is also concerned about condylomata (4- 5 3mm lesions) on her posterior fourchette, wants therapy immediately, cryotherapy recommended and will do this next week.  No other complaints or concerns.  Routine obstetric precautions reviewed.  Lab Addendum: Results for orders placed in visit on 11/09/12 (from the past 48 hour(s))  POCT URINALYSIS DIP (DEVICE)     Status: Abnormal   Collection Time    11/09/12  9:26 AM      Result Value Range   Glucose, UA NEGATIVE  NEGATIVE mg/dL   Bilirubin Urine NEGATIVE  NEGATIVE   Ketones, ur NEGATIVE  NEGATIVE mg/dL   Specific Gravity, Urine 1.025  1.005 - 1.030   Hgb urine dipstick NEGATIVE  NEGATIVE   pH 6.5  5.0 - 8.0   Protein, ur NEGATIVE  NEGATIVE mg/dL   Urobilinogen, UA 0.2  0.0 - 1.0 mg/dL   Nitrite NEGATIVE  NEGATIVE   Leukocytes, UA MODERATE (*) NEGATIVE   Comment: Biochemical Testing Only. Please order routine urinalysis from main lab if confirmatory testing is needed.  WET PREP, GENITAL     Status: Abnormal   Collection Time    11/09/12  9:48 AM      Result Value Range   Yeast Wet Prep HPF POC MOD (*) NONE SEEN   Trich, Wet Prep NONE SEEN  NONE SEEN   Clue Cells Wet Prep HPF POC FEW (*) NONE SEEN   WBC, Wet Prep HPF POC MANY (*) NONE SEEN   Prescribed both Diflucan and Metronidazole for vaginitis.  Patient called and told to pick up prescriptions; verified appointment for cryotherapy next week.

## 2012-11-09 NOTE — Patient Instructions (Signed)
Placenta Previa   Placenta previa is a condition in which the placenta has grown low in the womb (uterus). This is a condition in which the organ which connects the fetus to the mother's uterus (placenta) is low in the opening in the uterus (cervix). It can partially or completely cover the cervix. The cause of this is unknown. It is more common with multiple births or twins. SYMPTOMS  The main symptom or sign of placenta previa is vaginal bleeding. The bleeding can be mild to very heavy. This condition can be very serious for the mother and baby. Often there are no symptoms with placenta previa. Sometimes if the location of the placenta is very low it will become partially detached and cause bleeding. This may be simply a marginal sinus separation of the placenta. This is a separation of the vessels from the wall of the uterus. This may cause no further problems other than mild anxiety. There is an increase risk of intrauterine growth restriction (IUGR) with placenta previa because of the abnormal placement of the placenta. DIAGNOSIS  The diagnosis is usually made by ultrasound exam of the uterus. There may be a careful vaginal exam to see the cervix. The patient will be prepared for a Cesarean section immediately if necessary. TREATMENT  Treatment for placenta previa is usually bed rest in the hospital or at home. You may be given medication to stop contractions. Contractions can increase bleeding. Your doctor may take fluid from the baby's sac (amniocentesis) to see if the baby's lungs are mature enough for a C-section. A blood transfusion may be necessary if you have a low blood count. No further treatment may be needed when placenta previa is present in small degrees. Early placenta previa may resolve on it's own. The placenta moves higher in the birth canal as pregnancy progresses. In this case the placenta no longer is an obstruction to birth. The position of the placenta may need to be reconfirmed  during pregnancy. This can be done with an ultrasound exam of the belly(abdomen). Call your caregiver immediately if blood loss is severe. Immediate fluid or blood replacement may be necessary. With complete placenta previa, the only way to safely deliver the baby is by Cesarean section. HOME CARE INSTRUCTIONS   Follow your caregiver's advice about bed rest.  Take any iron pills or other medications your doctor gives to you.  No bending or lifting.  Do not have sexual intercourse.  Do not put anything in your vagina (tampons or vaginal creams). If you are bleeding, use sanitary pads.  Keep your doctors appointments as scheduled. Not keeping the appointment could result in a chronic or permanent injury, pain, disability and injury or death to you or your unborn baby. If there is any problem keeping the appointment, you must call back to this facility for assistance. SEEK IMMEDIATE MEDICAL CARE IF:   You have increased bleeding.  You have fainting episodes or feel lightheaded.  You develop abdominal pain.  You can no longer feel normal fetal or baby movements.  You develop uterine contractions. Document Released: 04/14/2005 Document Revised: 07/07/2011 Document Reviewed: 11/26/2007 [facility] Patient Information 2014 [place], [place].

## 2012-11-09 NOTE — Progress Notes (Signed)
Pulse- 80 

## 2012-11-10 LAB — WET PREP, GENITAL: Trich, Wet Prep: NONE SEEN

## 2012-11-10 MED ORDER — FLUCONAZOLE 150 MG PO TABS: 150.0000 mg | ORAL_TABLET | Freq: Once | ORAL | Status: DC

## 2012-11-10 MED ORDER — METRONIDAZOLE 500 MG PO TABS: 500.0000 mg | ORAL_TABLET | Freq: Two times a day (BID) | ORAL | Status: AC

## 2012-11-11 ENCOUNTER — Encounter: Payer: Self-pay | Admitting: Obstetrics & Gynecology

## 2012-11-17 ENCOUNTER — Ambulatory Visit (INDEPENDENT_AMBULATORY_CARE_PROVIDER_SITE_OTHER): Payer: Medicaid Other | Admitting: Obstetrics & Gynecology

## 2012-11-17 VITALS — BP 111/74 | Temp 98.5°F | Wt 176.9 lb

## 2012-11-17 DIAGNOSIS — N9089 Other specified noninflammatory disorders of vulva and perineum: Secondary | ICD-10-CM

## 2012-11-17 DIAGNOSIS — F411 Generalized anxiety disorder: Secondary | ICD-10-CM

## 2012-11-17 DIAGNOSIS — F419 Anxiety disorder, unspecified: Secondary | ICD-10-CM

## 2012-11-17 LAB — POCT URINALYSIS DIP (DEVICE)
Hgb urine dipstick: NEGATIVE
Nitrite: NEGATIVE
Protein, ur: NEGATIVE mg/dL
Specific Gravity, Urine: 1.015 (ref 1.005–1.030)
Urobilinogen, UA: 0.2 mg/dL (ref 0.0–1.0)
pH: 7 (ref 5.0–8.0)

## 2012-11-17 NOTE — Patient Instructions (Signed)
Return to clinic for any obstetric concerns or go to MAU for evaluation  

## 2012-11-17 NOTE — Progress Notes (Signed)
Pulse: 90

## 2012-11-17 NOTE — Progress Notes (Signed)
Patient is here today for cryotherapy for lesions seen last week. On examination today, the lesions have flattened out one there is a 3 mm ulceration on the left side of thye posterior fourchette.  There was also one lesion above this area; an attempt was made to unroof this lesion but there was immediate return of blood not fluid.  HSV culture obtained; will follow up results and manage accordingly.Patient was advised to use hydrocortisone cream prn irritation for now.  Follow up with prenatal care as scheduled.

## 2012-11-30 ENCOUNTER — Ambulatory Visit (HOSPITAL_COMMUNITY)
Admission: RE | Admit: 2012-11-30 | Discharge: 2012-11-30 | Disposition: A | Discharge status: Home / Self Care | Payer: Medicaid Other | Source: Ambulatory Visit | Attending: Obstetrics & Gynecology | Admitting: Obstetrics & Gynecology

## 2012-11-30 ENCOUNTER — Other Ambulatory Visit: Payer: Self-pay | Admitting: Obstetrics & Gynecology

## 2012-11-30 ENCOUNTER — Encounter: Payer: Self-pay | Admitting: Obstetrics & Gynecology

## 2012-11-30 ENCOUNTER — Encounter (HOSPITAL_COMMUNITY): Payer: Self-pay

## 2012-11-30 DIAGNOSIS — O4400 Placenta previa specified as without hemorrhage, unspecified trimester: Secondary | ICD-10-CM | POA: Insufficient documentation

## 2012-11-30 DIAGNOSIS — Z3689 Encounter for other specified antenatal screening: Secondary | ICD-10-CM | POA: Insufficient documentation

## 2012-11-30 DIAGNOSIS — O4402 Placenta previa specified as without hemorrhage, second trimester: Secondary | ICD-10-CM

## 2012-12-08 ENCOUNTER — Encounter: Payer: Medicaid Other | Admitting: Advanced Practice Midwife

## 2012-12-28 ENCOUNTER — Encounter: Payer: Self-pay | Admitting: *Deleted

## 2013-01-06 ENCOUNTER — Encounter: Payer: Self-pay | Admitting: Family Medicine

## 2013-01-06 ENCOUNTER — Ambulatory Visit (INDEPENDENT_AMBULATORY_CARE_PROVIDER_SITE_OTHER): Payer: Medicaid Other | Admitting: Family Medicine

## 2013-01-06 VITALS — BP 110/82 | HR 78 | Temp 97.5°F | Resp 18 | Wt 181.0 lb

## 2013-01-06 DIAGNOSIS — H539 Unspecified visual disturbance: Secondary | ICD-10-CM

## 2013-01-06 NOTE — Assessment & Plan Note (Signed)
Very nonspecific changes in her eyes. She is more nearsighted which is very common with pregnancy. She knows that she needs glasses however cannot afford them. Advise her to atropine drops cannot be prescribed and that she would need to see an eye doctor but can wait until after she delivers a serum any physiologic I. changes with pregnancy.

## 2013-01-06 NOTE — Patient Instructions (Signed)
I would wait until after delivery and get an eye exam If you lose vision in eyes this is an emergency F/U as needed

## 2013-01-06 NOTE — Progress Notes (Signed)
  Subjective:    Patient ID: Alexandria [name], female    DOB: [birth year], 27 y.o.   MRN: [id number]  HPI  Patient presents secondary to changes in her eyes. She states that her eyes not focusing as much as before. Mostly in her left eye. She denies any drainage from the eye or redness. Her vision is intact. She states that she is medicating cannot afford glasses or contacts therefore she was looking online I saw that she could be prescribed atropine drops. She is in her third trimester of her pregnancy and things are going well.  Review of Systems - per above  GEN- denies fatigue, fever, weight loss,weakness, recent illness HEENT- denies eye drainage, +change in vision, nasal discharge, Neuro- denies headache, dizziness, syncope, seizure activity        Objective:   Physical Exam  GEN- NAD, alert and oriented x3 HEENT- PERRL, EOMI, non injected sclera, pink conjunctiva, MMM, fundus benign, no papilledema , vision grossly intact, normal snellen chart.        Assessment & Plan:

## 2013-03-22 ENCOUNTER — Encounter (HOSPITAL_COMMUNITY): Payer: Self-pay

## 2013-03-22 ENCOUNTER — Inpatient Hospital Stay (HOSPITAL_COMMUNITY)
Admission: AD | Admit: 2013-03-22 | Discharge: 2013-03-24 | DRG: 775 | Disposition: A | Discharge status: Home / Self Care | Payer: Medicaid Other | Source: Ambulatory Visit | Attending: Obstetrics and Gynecology | Admitting: Obstetrics and Gynecology

## 2013-03-22 DIAGNOSIS — O99892 Other specified diseases and conditions complicating childbirth: Principal | ICD-10-CM | POA: Diagnosis present

## 2013-03-22 DIAGNOSIS — Z22330 Carrier of Group B streptococcus: Secondary | ICD-10-CM

## 2013-03-22 LAB — OB RESULTS CONSOLE GBS: GBS: POSITIVE

## 2013-03-22 NOTE — MAU Note (Signed)
Contractions, no bleeding or ROM

## 2013-03-22 NOTE — MAU Note (Addendum)
Pt reports started having ctxs, more regular at 7:30pm, now they are 5 min apart she states.  Denies any vag bldg.  Reports good fetal movement.

## 2013-03-23 ENCOUNTER — Encounter (HOSPITAL_COMMUNITY): Payer: Self-pay

## 2013-03-23 ENCOUNTER — Encounter (HOSPITAL_COMMUNITY): Payer: Medicaid Other | Admitting: Anesthesiology

## 2013-03-23 ENCOUNTER — Inpatient Hospital Stay (HOSPITAL_COMMUNITY): Payer: Medicaid Other | Admitting: Anesthesiology

## 2013-03-23 LAB — RPR: RPR Ser Ql: NONREACTIVE

## 2013-03-23 LAB — CBC
HCT: 34.8 % — ABNORMAL LOW (ref 36.0–46.0)
Hemoglobin: 12.2 g/dL (ref 12.0–15.0)
MCH: 28.8 pg (ref 26.0–34.0)
MCHC: 35.1 g/dL (ref 30.0–36.0)
RBC: 4.23 MIL/uL (ref 3.87–5.11)

## 2013-03-23 MED ORDER — OXYTOCIN 40 UNITS IN LACTATED RINGERS INFUSION - SIMPLE MED: 62.5000 mL/h | INTRAVENOUS | Status: AC | PRN

## 2013-03-23 MED ORDER — LACTATED RINGERS IV SOLN
500.0000 mL | Freq: Once | INTRAVENOUS | Status: AC
  Administered 2013-03-23: 500 mL via INTRAVENOUS

## 2013-03-23 MED ORDER — PRENATAL MULTIVITAMIN CH
1.0000 | ORAL_TABLET | Freq: Every day | ORAL | Status: DC
  Administered 2013-03-23 – 2013-03-24 (×2): 1 via ORAL
  Filled 2013-03-23 (×2): qty 1

## 2013-03-23 MED ORDER — FLEET ENEMA 7-19 GM/118ML RE ENEM: 1.0000 | ENEMA | RECTAL | Status: DC | PRN

## 2013-03-23 MED ORDER — TETANUS-DIPHTH-ACELL PERTUSSIS 5-2.5-18.5 LF-MCG/0.5 IM SUSP: 0.5000 mL | Freq: Once | INTRAMUSCULAR | Status: DC

## 2013-03-23 MED ORDER — FENTANYL 2.5 MCG/ML BUPIVACAINE 1/10 % EPIDURAL INFUSION (WH - ANES)
INTRAMUSCULAR | Status: DC | PRN
  Administered 2013-03-23: 14 mL/h via EPIDURAL

## 2013-03-23 MED ORDER — MEASLES, MUMPS & RUBELLA VAC ~~LOC~~ INJ
0.5000 mL | INJECTION | Freq: Once | SUBCUTANEOUS | Status: AC
  Administered 2013-03-24: 0.5 mL via SUBCUTANEOUS
  Filled 2013-03-23 (×2): qty 0.5

## 2013-03-23 MED ORDER — WITCH HAZEL-GLYCERIN EX PADS: 1.0000 "application " | MEDICATED_PAD | CUTANEOUS | Status: DC | PRN

## 2013-03-23 MED ORDER — SENNOSIDES-DOCUSATE SODIUM 8.6-50 MG PO TABS
2.0000 | ORAL_TABLET | ORAL | Status: DC
  Administered 2013-03-24: 2 via ORAL
  Filled 2013-03-23: qty 2

## 2013-03-23 MED ORDER — DIBUCAINE 1 % RE OINT: 1.0000 "application " | TOPICAL_OINTMENT | RECTAL | Status: DC | PRN

## 2013-03-23 MED ORDER — PRENATAL MULTIVITAMIN CH: 1.0000 | ORAL_TABLET | Freq: Every day | ORAL | Status: DC

## 2013-03-23 MED ORDER — OXYCODONE-ACETAMINOPHEN 5-325 MG PO TABS
1.0000 | ORAL_TABLET | ORAL | Status: DC | PRN
Start: 2013-03-23 — End: 2013-03-24
  Administered 2013-03-23 – 2013-03-24 (×7): 1 via ORAL
  Filled 2013-03-23 (×6): qty 1

## 2013-03-23 MED ORDER — EPHEDRINE 5 MG/ML INJ
10.0000 mg | INTRAVENOUS | Status: DC | PRN
  Filled 2013-03-23: qty 4
  Filled 2013-03-23: qty 2

## 2013-03-23 MED ORDER — OXYTOCIN 40 UNITS IN LACTATED RINGERS INFUSION - SIMPLE MED
62.5000 mL/h | INTRAVENOUS | Status: DC
  Filled 2013-03-23: qty 1000

## 2013-03-23 MED ORDER — PENICILLIN G POTASSIUM 5000000 UNITS IJ SOLR
5.0000 10*6.[IU] | Freq: Once | INTRAVENOUS | Status: AC
  Administered 2013-03-23: 5 10*6.[IU] via INTRAVENOUS
  Filled 2013-03-23: qty 5

## 2013-03-23 MED ORDER — ONDANSETRON HCL 4 MG PO TABS: 4.0000 mg | ORAL_TABLET | ORAL | Status: DC | PRN

## 2013-03-23 MED ORDER — IBUPROFEN 600 MG PO TABS
600.0000 mg | ORAL_TABLET | Freq: Four times a day (QID) | ORAL | Status: DC
  Administered 2013-03-23 – 2013-03-24 (×5): 600 mg via ORAL
  Filled 2013-03-23 (×6): qty 1

## 2013-03-23 MED ORDER — BENZOCAINE-MENTHOL 20-0.5 % EX AERO: 1.0000 "application " | INHALATION_SPRAY | CUTANEOUS | Status: DC | PRN

## 2013-03-23 MED ORDER — PHENYLEPHRINE 40 MCG/ML (10ML) SYRINGE FOR IV PUSH (FOR BLOOD PRESSURE SUPPORT)
80.0000 ug | PREFILLED_SYRINGE | INTRAVENOUS | Status: DC | PRN
  Filled 2013-03-23: qty 10
  Filled 2013-03-23: qty 2

## 2013-03-23 MED ORDER — DEXTROSE 5 % IV SOLN
2.5000 10*6.[IU] | INTRAVENOUS | Status: DC
  Filled 2013-03-23 (×3): qty 2.5

## 2013-03-23 MED ORDER — ONDANSETRON HCL 4 MG/2ML IJ SOLN: 4.0000 mg | INTRAMUSCULAR | Status: DC | PRN

## 2013-03-23 MED ORDER — OXYTOCIN BOLUS FROM INFUSION
500.0000 mL | INTRAVENOUS | Status: DC
  Administered 2013-03-23: 500 mL via INTRAVENOUS

## 2013-03-23 MED ORDER — LACTATED RINGERS IV SOLN: INTRAVENOUS | Status: AC

## 2013-03-23 MED ORDER — OXYCODONE-ACETAMINOPHEN 5-325 MG PO TABS: 1.0000 | ORAL_TABLET | ORAL | Status: DC | PRN

## 2013-03-23 MED ORDER — LIDOCAINE HCL (PF) 1 % IJ SOLN
30.0000 mL | INTRAMUSCULAR | Status: DC | PRN
  Filled 2013-03-23 (×2): qty 30

## 2013-03-23 MED ORDER — LACTATED RINGERS IV SOLN: 500.0000 mL | INTRAVENOUS | Status: DC | PRN

## 2013-03-23 MED ORDER — LIDOCAINE HCL (PF) 1 % IJ SOLN
INTRAMUSCULAR | Status: DC | PRN
  Administered 2013-03-23 (×2): 4 mL

## 2013-03-23 MED ORDER — DIPHENHYDRAMINE HCL 25 MG PO CAPS: 25.0000 mg | ORAL_CAPSULE | Freq: Four times a day (QID) | ORAL | Status: DC | PRN

## 2013-03-23 MED ORDER — ACETAMINOPHEN 325 MG PO TABS: 650.0000 mg | ORAL_TABLET | ORAL | Status: DC | PRN

## 2013-03-23 MED ORDER — CITRIC ACID-SODIUM CITRATE 334-500 MG/5ML PO SOLN
30.0000 mL | ORAL | Status: DC | PRN
  Administered 2013-03-23: 30 mL via ORAL
  Filled 2013-03-23: qty 15

## 2013-03-23 MED ORDER — LANOLIN HYDROUS EX OINT: TOPICAL_OINTMENT | CUTANEOUS | Status: DC | PRN

## 2013-03-23 MED ORDER — EPHEDRINE 5 MG/ML INJ
10.0000 mg | INTRAVENOUS | Status: DC | PRN
  Filled 2013-03-23: qty 2

## 2013-03-23 MED ORDER — FENTANYL 2.5 MCG/ML BUPIVACAINE 1/10 % EPIDURAL INFUSION (WH - ANES)
14.0000 mL/h | INTRAMUSCULAR | Status: DC | PRN
  Filled 2013-03-23: qty 125

## 2013-03-23 MED ORDER — ONDANSETRON HCL 4 MG/2ML IJ SOLN: 4.0000 mg | Freq: Four times a day (QID) | INTRAMUSCULAR | Status: DC | PRN

## 2013-03-23 MED ORDER — LACTATED RINGERS IV SOLN: INTRAVENOUS | Status: DC

## 2013-03-23 MED ORDER — DIPHENHYDRAMINE HCL 50 MG/ML IJ SOLN: 12.5000 mg | INTRAMUSCULAR | Status: DC | PRN

## 2013-03-23 MED ORDER — IBUPROFEN 600 MG PO TABS
600.0000 mg | ORAL_TABLET | Freq: Four times a day (QID) | ORAL | Status: DC | PRN
  Administered 2013-03-23: 600 mg via ORAL
  Filled 2013-03-23: qty 1

## 2013-03-23 MED ORDER — ZOLPIDEM TARTRATE 5 MG PO TABS: 5.0000 mg | ORAL_TABLET | Freq: Every evening | ORAL | Status: DC | PRN

## 2013-03-23 MED ORDER — SIMETHICONE 80 MG PO CHEW: 80.0000 mg | CHEWABLE_TABLET | ORAL | Status: DC | PRN

## 2013-03-23 MED ORDER — PHENYLEPHRINE 40 MCG/ML (10ML) SYRINGE FOR IV PUSH (FOR BLOOD PRESSURE SUPPORT)
80.0000 ug | PREFILLED_SYRINGE | INTRAVENOUS | Status: DC | PRN
  Filled 2013-03-23: qty 2

## 2013-03-23 NOTE — Progress Notes (Signed)
Patient ID: Alexandria [name], female   DOB: [birth year], 27 y.o.   MRN: [id number] Contractions are q 2-3 minutes the cervix is 8 cm 80-90 % effaced and the vertex is at 0 station.

## 2013-03-23 NOTE — H&P (Signed)
NAMETYLEAH, Alexandria NO.:  [name]  MEDICAL RECORD NO.:  [id number]  LOCATION:  9168                          FACILITY:  [facility]  PHYSICIAN:  Malachi Pro. Ambrose Mantle, M.D. DATE OF BIRTH:  12/19/1985  DATE OF ADMISSION:  03/23/2013 DATE OF DISCHARGE:                             HISTORY & PHYSICAL   PRESENT ILLNESS:  This is a 27 year old white female, para 2-0-0-2, gravida 3 with [facility] March 29, 2013, admitted in early labor.  Blood group and type A negative with a negative antibody, rubella nonimmune, RPR nonreactive, hepatitis B surface antigen negative, HIV negative, GC and chlamydia negative.  One hour Glucola 99.  Group B strep positive. The patient began her prenatal course at the clinic and transferred to our care in late pregnancy.  She has had no significant complications in late pregnancy and was found to be 3 cm dilated on her visit to our office, March 22, 2013.  She began contracting at about 7 p.m. on that day and came to the hospital early in the morning of March 23, 2013.  The nurses in the maternity admission unit thought she changed her cervix from 3 cm to 4 cm, and she was admitted.  She was group B strep positive and was started on penicillin.  PAST MEDICAL HISTORY:  Revealed no known drug allergies, illnesses, anxiety, and depression.  OPERATIONS:  None listed.  She does have a history of cervical dysplasia.  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  The plan was to repeat her Pap smear postpartum. She had reported no alcohol or tobacco but was a former smoker.  Her EDC was calculated based on an ultrasound, August 12, 2012, at 7 weeks and 4 days.  PHYSICAL EXAMINATION:  VITAL SIGNS:  Temperature 98.1, pulse 119, respirations 18, blood pressure 188/47. HEART:  Normal size and sounds.  No murmurs. LUNGS:  Clear to auscultation. ABDOMEN:  Soft.  No recent prenatal visit is recorded.  Fundus seems appropriate for dates.  Cervix is 4-5  cm 80%, vertex at -1 to -2. Artificial rupture of the membranes produced bloody material.  I did not actually see clear fluid but there was no evidence of meconium.  ADMITTING IMPRESSION:  Intrauterine pregnancy at 39+ weeks, positive group B streptococcus, Rh negativity, early labor.  The patient will be observed.  She has received her 1st dose of penicillin that began at 2:20 a.m.  She will be observed for progress of labor.  If no progress, we will begin Pitocin.     Malachi Pro. Ambrose Mantle, M.D.    TFH/MEDQ  D:  03/23/2013  T:  03/23/2013  Job:  [id number]

## 2013-03-23 NOTE — Progress Notes (Signed)
Patient was referred for history of depression/anxiety. * Referral screened out by Clinical Social Worker because none of the following criteria appear to apply:  ~ History of anxiety/depression during this pregnancy, or of post-partum depression.  ~ Diagnosis of anxiety and/or depression within last 3 years  ~ History of depression due to pregnancy loss/loss of child  OR * Patient's symptoms currently being treated with medication and/or therapy, @ [facility] in [place].  Please contact the Clinical Social Worker if needs arise, or by the patient's request.  

## 2013-03-23 NOTE — Anesthesia Postprocedure Evaluation (Signed)
Anesthesia Post Note  Patient: Alexandria [name]  Procedure(s) Performed: * No procedures listed *  Anesthesia type: Epidural  Patient location: Mother/Baby  Post pain: Pain level controlled  Post assessment: Post-op Vital signs reviewed  Last Vitals:  Filed Vitals:   03/23/13 1000  BP: 118/65  Pulse: 88  Temp:   Resp: 18    Post vital signs: Reviewed  Level of consciousness:alert  Complications: No apparent anesthesia complications

## 2013-03-23 NOTE — Anesthesia Preprocedure Evaluation (Signed)
Anesthesia Evaluation  Patient identified by MRN, date of birth, ID band Patient awake    Reviewed: Allergy & Precautions, H&P , Patient's Chart, lab work & pertinent test results  Airway Mallampati: III TM Distance: >3 FB Neck ROM: full    Dental no notable dental hx. (+) Teeth Intact   Pulmonary neg pulmonary ROS,  breath sounds clear to auscultation  Pulmonary exam normal       Cardiovascular negative cardio ROS  Rhythm:regular Rate:Normal     Neuro/Psych PSYCHIATRIC DISORDERS Anxiety Depression negative neurological ROS     GI/Hepatic Neg liver ROS, GERD-  Medicated and Controlled,  Endo/Other  negative endocrine ROS  Renal/GU negative Renal ROS  negative genitourinary   Musculoskeletal negative musculoskeletal ROS (+)   Abdominal Normal abdominal exam  (+)   Peds  Hematology negative hematology ROS (+)   Anesthesia Other Findings   Reproductive/Obstetrics (+) Pregnancy                           Anesthesia Physical Anesthesia Plan  ASA: II  Anesthesia Plan: Epidural   Post-op Pain Management:    Induction:   Airway Management Planned:   Additional Equipment:   Intra-op Plan:   Post-operative Plan:   Informed Consent: I have reviewed the patients History and Physical, chart, labs and discussed the procedure including the risks, benefits and alternatives for the proposed anesthesia with the patient or authorized representative who has indicated his/her understanding and acceptance.     Plan Discussed with: Anesthesiologist  Anesthesia Plan Comments:         Anesthesia Quick Evaluation

## 2013-03-23 NOTE — Progress Notes (Signed)
Patient ID: Alexandria [name], female   DOB: 1985/11/05, 27 y.o.   MRN: [id number] Delivery note:  The patient rapidly progressed to full dilatation and pushed well. She delivered a living female infant spontaneously OA over an intact perineum Apgars were 9 and 9 at 1 and 5 minutes. The placenta was expelled intact and the uterus was normal. There was a small laceration superior and to the left of the urethral meatus that was hemostatic and not repaired. EBL 400 cc's.

## 2013-03-23 NOTE — Progress Notes (Signed)
Patient ID: Alexandria [name], female   DOB: 1985-11-21, 27 y.o.   MRN: [id number] DOD VS stable no complaints.

## 2013-03-23 NOTE — Anesthesia Procedure Notes (Signed)
Epidural Patient location during procedure: OB Start time: 03/23/2013 2:46 AM  Staffing Anesthesiologist: Ashlynn Gunnels A. Performed by: anesthesiologist   Preanesthetic Checklist Completed: patient identified, site marked, surgical consent, pre-op evaluation, timeout performed, IV checked, risks and benefits discussed and monitors and equipment checked  Epidural Patient position: sitting Prep: site prepped and draped and DuraPrep Patient monitoring: continuous pulse ox and blood pressure Approach: midline Injection technique: LOR air  Needle:  Needle type: Tuohy  Needle gauge: 17 G Needle length: 9 cm and 9 Needle insertion depth: 5 cm cm Catheter type: closed end flexible Catheter size: 19 Gauge Catheter at skin depth: 10 cm Test dose: negative and Other  Assessment Events: blood not aspirated, injection not painful, no injection resistance, negative IV test and no paresthesia  Additional Notes Patient identified. Risks and benefits discussed including failed block, incomplete  Pain control, post dural puncture headache, nerve damage, paralysis, blood pressure Changes, nausea, vomiting, reactions to medications-both toxic and allergic and post Partum back pain. All questions were answered. Patient expressed understanding and wished to proceed. Sterile technique was used throughout procedure. Epidural site was Dressed with sterile barrier dressing. No paresthesias, signs of intravascular injection Or signs of intrathecal spread were encountered.  Patient was more comfortable after the epidural was dosed. Please see RN's note for documentation of vital signs and FHR which are stable.

## 2013-03-24 LAB — CBC
Hemoglobin: 10.4 g/dL — ABNORMAL LOW (ref 12.0–15.0)
MCHC: 34.4 g/dL (ref 30.0–36.0)
Platelets: 176 10*3/uL (ref 150–400)
RBC: 3.64 MIL/uL — ABNORMAL LOW (ref 3.87–5.11)
WBC: 11.5 10*3/uL — ABNORMAL HIGH (ref 4.0–10.5)

## 2013-03-24 MED ORDER — OXYCODONE-ACETAMINOPHEN 5-325 MG PO TABS: 1.0000 | ORAL_TABLET | Freq: Four times a day (QID) | ORAL | Status: DC | PRN

## 2013-03-24 MED ORDER — PRENATAL MULTIVITAMIN CH: 1.0000 | ORAL_TABLET | Freq: Every day | ORAL | Status: DC

## 2013-03-24 MED ORDER — RHO D IMMUNE GLOBULIN 1500 UNIT/2ML IJ SOLN
300.0000 ug | Freq: Once | INTRAMUSCULAR | Status: AC
  Administered 2013-03-24: 300 ug via INTRAMUSCULAR
  Filled 2013-03-24: qty 2

## 2013-03-24 MED ORDER — IBUPROFEN 800 MG PO TABS: 800.0000 mg | ORAL_TABLET | Freq: Three times a day (TID) | ORAL | Status: DC | PRN

## 2013-03-24 NOTE — Discharge Summary (Signed)
Obstetric Discharge Summary Reason for Admission: onset of labor Prenatal Procedures: none Intrapartum Procedures: spontaneous vaginal delivery Postpartum Procedures: none Complications-Operative and Postpartum: vaginal laceration Hemoglobin  Date Value Range Status  03/24/2013 10.4* 12.0 - 15.0 g/dL Final     HCT  Date Value Range Status  03/24/2013 30.2* 36.0 - 46.0 % Final    Physical Exam:  General: alert and no distress Lochia: appropriate Uterine Fundus: firm  Discharge Diagnoses: Term Pregnancy-delivered  Discharge Information: Date: 03/24/2013 Activity: pelvic rest Diet: routine Medications: PNV, Ibuprofen and Percocet Condition: stable Instructions: refer to practice specific booklet Discharge to: home Follow-up Information   Follow up with Alexandria Plume, MD. Schedule an appointment as soon as possible for a visit in 6 weeks.   Specialty:  Obstetrics and Gynecology   Contact information:   [address], [address], [address] [place] [address] [phone]       Newborn Data: Live born female  Birth Weight: 8 lb 5.7 oz (3790 g) APGAR: 9, 9  Home with mother.  Alexandria [name] 03/24/2013, 9:51 AM

## 2013-03-24 NOTE — Progress Notes (Signed)
Post Partum Day 1 Subjective: no complaints, up ad lib, voiding, tolerating PO and nl lochia, pain controlled  Objective: Blood pressure 102/50, pulse 69, temperature 98.1 F (36.7 C), temperature source Oral, resp. rate 19, height 5\' 4"  (1.626 m), weight 86.637 kg (191 lb), last menstrual period 06/22/2012, SpO2 95.00%, unknown if currently breastfeeding.  Physical Exam:  General: alert and no distress Lochia: appropriate Uterine Fundus: firm   Recent Labs  03/23/13 0152 03/24/13 0640  HGB 12.2 10.4*  HCT 34.8* 30.2*    Assessment/Plan: Plan for discharge tomorrow or today.  Breastfeeding/Lactation c/s - if d/c, d/c with motrin, percocet, pnv.  F/u 6 weeks.  Routine care.     LOS: 2 days   BOVARD,Lily Velasquez 03/24/2013, 9:09 AM

## 2013-03-25 LAB — RH IG WORKUP (INCLUDES ABO/RH)
ABO/RH(D): A NEG
DAT, IgG: NEGATIVE
Fetal Screen: NEGATIVE
Gestational Age(Wks): 39
Unit division: 0

## 2013-03-29 ENCOUNTER — Inpatient Hospital Stay (HOSPITAL_COMMUNITY): Admission: RE | Admit: 2013-03-29 | Payer: Medicaid Other | Source: Ambulatory Visit

## 2013-04-01 ENCOUNTER — Encounter: Payer: Self-pay | Admitting: Family Medicine

## 2013-04-01 ENCOUNTER — Ambulatory Visit (INDEPENDENT_AMBULATORY_CARE_PROVIDER_SITE_OTHER): Payer: Medicaid Other | Admitting: Family Medicine

## 2013-04-01 VITALS — BP 120/82 | HR 80 | Temp 98.3°F | Resp 16 | Wt 168.0 lb

## 2013-04-01 DIAGNOSIS — J069 Acute upper respiratory infection, unspecified: Secondary | ICD-10-CM

## 2013-04-01 MED ORDER — SERTRALINE HCL 50 MG PO TABS: 50.0000 mg | ORAL_TABLET | Freq: Every day | ORAL | Status: DC

## 2013-04-01 MED ORDER — GABAPENTIN 300 MG PO CAPS: ORAL_CAPSULE | ORAL | Status: DC

## 2013-04-01 NOTE — Progress Notes (Signed)
   Subjective:    Patient ID: Alexandria [name], female    DOB: [birth year], 27 y.o.   MRN: [id number]  HPI  Patient reports 4 days of cough productive of yellow or green sputum, postnasal drip and nasal drain productive of yellow and green sputum and sinus pressure. She denies any fevers. She denies any sinus pain. She denies any shortness of breath or dyspnea on exertion.  She also has a history of generalized anxiety disorder. She is currently being treated with Zoloft 50 mg by mouth each bedtime and Neurontin 300 mg by mouth Q8 hours as needed for anxiety. She was seen a psychiatrist in [facility] however since she has moved to different town he she no longer has that psychiatrist due to her Medicaid. She is wondering if we could refill that medication. Past Medical History  Diagnosis Date  . Depression   . Anxiety   . High risk HPV infection     followed by GYN  . CIN III (cervical intraepithelial neoplasia III)    No current outpatient prescriptions on file prior to visit.   No current facility-administered medications on file prior to visit.   No past surgical history on file. No Known Allergies History   Social History  . Marital Status: Single    Spouse Name: N/A    Number of Children: N/A  . Years of Education: N/A   Occupational History  . Not on file.   Social History Main Topics  . Smoking status: Never Smoker   . Smokeless tobacco: Not on file  . Alcohol Use: No  . Drug Use: No  . Sexual Activity: Yes    Birth Control/ Protection: None   Other Topics Concern  . Not on file   Social History Narrative  . No narrative on file     Review of Systems  All other systems reviewed and are negative.       Objective:   Physical Exam  Vitals reviewed. HENT:  Right Ear: External ear normal.  Left Ear: External ear normal.  Nose: Nose normal.  Mouth/Throat: Oropharynx is clear and moist. No oropharyngeal exudate.  Neck: Neck supple.    Cardiovascular: Normal rate, regular rhythm and normal heart sounds.   No murmur heard. Pulmonary/Chest: Effort normal and breath sounds normal. No respiratory distress. She has no wheezes. She has no rales.  Lymphadenopathy:    She has no cervical adenopathy.          Assessment & Plan:  1. URI (upper respiratory infection) I explained to the patient happily she has a viral upper respiratory infection. Anticipate spontaneous resolution of the next 3-4 days. I recommended supportive care only with Mucinex as needed for cough, Sudafed as needed for congestion, and Tylenol as needed for fever. If her symptoms worsen she is to call me immediately. If her symptoms persist greater than 10 days are prescribed anabolic for possible secondary sinusitis.  I also refilled the patient Zoloft 50 mg by mouth each bedtime and gabapentin 300 mg by mouth every 8 hours when necessary anxiety.

## 2013-04-06 ENCOUNTER — Ambulatory Visit: Payer: Medicaid Other | Admitting: Family Medicine

## 2013-04-28 HISTORY — PX: BIOPSY THYROID: PRO38

## 2013-07-06 ENCOUNTER — Ambulatory Visit: Payer: Medicaid Other | Admitting: Family Medicine

## 2013-07-13 ENCOUNTER — Ambulatory Visit: Payer: Medicaid Other | Admitting: Family Medicine

## 2013-07-23 ENCOUNTER — Other Ambulatory Visit: Payer: Self-pay | Admitting: Family Medicine

## 2013-07-25 NOTE — Telephone Encounter (Signed)
Refill appropriate and filled per protocol. 

## 2013-10-19 ENCOUNTER — Other Ambulatory Visit: Payer: Self-pay | Admitting: Family Medicine

## 2013-10-19 ENCOUNTER — Encounter: Payer: Self-pay | Admitting: Family Medicine

## 2013-10-19 ENCOUNTER — Ambulatory Visit (INDEPENDENT_AMBULATORY_CARE_PROVIDER_SITE_OTHER): Payer: Medicaid Other | Admitting: Family Medicine

## 2013-10-19 VITALS — BP 114/76 | HR 82 | Temp 97.7°F | Resp 16 | Ht 64.0 in | Wt 169.0 lb

## 2013-10-19 DIAGNOSIS — IMO0002 Reserved for concepts with insufficient information to code with codable children: Secondary | ICD-10-CM

## 2013-10-19 DIAGNOSIS — R6889 Other general symptoms and signs: Secondary | ICD-10-CM

## 2013-10-19 DIAGNOSIS — Z20828 Contact with and (suspected) exposure to other viral communicable diseases: Secondary | ICD-10-CM

## 2013-10-19 DIAGNOSIS — F411 Generalized anxiety disorder: Secondary | ICD-10-CM

## 2013-10-19 DIAGNOSIS — Z113 Encounter for screening for infections with a predominantly sexual mode of transmission: Secondary | ICD-10-CM

## 2013-10-19 DIAGNOSIS — F419 Anxiety disorder, unspecified: Secondary | ICD-10-CM

## 2013-10-19 LAB — WET PREP FOR TRICH, YEAST, CLUE
Trich, Wet Prep: NONE SEEN
Yeast Wet Prep HPF POC: NONE SEEN

## 2013-10-19 MED ORDER — SERTRALINE HCL 100 MG PO TABS: 100.0000 mg | ORAL_TABLET | Freq: Every day | ORAL | Status: DC

## 2013-10-19 NOTE — Progress Notes (Signed)
Patient ID: Alexandria [name], female   DOB: 1985-10-29, 28 y.o.   MRN: [id number]   Subjective:    Patient ID: Alexandria [name], female    DOB: Feb 12, 1986, 28 y.o.   MRN: [id number]  Patient presents for PAP Smear/ STD screen and Discuss Zoloft  patient here for STD screening repeat Pap smear. She has history of LG SIL with CIN 3, was followed by GYN she had baby recently he is down 68 months old she did have a repeat Pap smear at the end of her pregnancy but was told that she and another in 6 months which she has not had. She also requests STD check as she had an affair recently and found out that the other partner has multiple partners.  She's history of anxiety and is currently on gabapentin as well as Zoloft 50 mg. She did not suffer with mild postpartum depression but feels like she is very stressed out to try to manage her household. She has 3 boys from the age of 28 months to 2 years old. Her husband does not very helpful at home. She does get support from her mother who comes over 4 days a week to help with the children. She is sleeping fairly well but still feels stressed. She has on occasion taken Zoloft 50 mg twice a day and these are her good day she would like to have her medication increased.  She is on birth control pills. Her cycle was about 2 and half weeks ago she states that it came on again but she admits a day or 2 of her pills. She's now taking her birth control and regular basis to   Review Of Systems:  GEN- denies fatigue, fever, weight loss,weakness, recent illness HEENT- denies eye drainage, change in vision, nasal discharge, CVS- denies chest pain, palpitations RESP- denies SOB, cough, wheeze ABD- denies N/V, change in stools, abd pain GU- denies dysuria, hematuria, dribbling, incontinence MSK- denies joint pain, muscle aches, injury Neuro- denies headache, dizziness, syncope, seizure activity       Objective:    BP 114/76  Pulse 82  Temp(Src) 97.7 F (36.5 C)  (Oral)  Resp 16  Ht 5\' 4"  (1.626 m)  Wt 169 lb (76.658 kg)  BMI 28.99 kg/m2  LMP 10/16/2013  Breastfeeding? No GEN- NAD, alert and oriented x3 GU- normal external genitalia, vaginal mucosa pink and moist, cervix visualized no growth,bloody discharge from os,  no CMT, no ovarian masses, uterus normal size Psych- normal affect and mood, well groomed, good eye contact, no SI Ext- no edema        Assessment & Plan:      Problem List Items Addressed This Visit   Anxiety     Continue gabapentin at bedtime I will increase her Zoloft 100 mg once a day she will return in 8 weeks for recheck    Relevant Medications      sertraline (ZOLOFT) tablet    Other Visit Diagnoses   LGSIL (low grade squamous intraepithelial dysplasia)    -  Primary    Repeat PAP Smear    Relevant Orders       PAP, ThinPrep ASCUS Rflx HPV Rflx Type    Screen for STD (sexually transmitted disease)        Relevant Orders       WET PREP FOR [place], YEAST, CLUE (Completed)       GC/chlamydia probe amp, genital    Exposure to viral disease  Relevant Orders       HIV antibody       RPR       Note: This dictation was prepared with Dragon dictation along with smaller phrase technology. Any transcriptional errors that result from this process are unintentional.

## 2013-10-19 NOTE — Assessment & Plan Note (Signed)
Continue gabapentin at bedtime I will increase her Zoloft 100 mg once a day she will return in 8 weeks for recheck

## 2013-10-19 NOTE — Patient Instructions (Signed)
Increase zoloft to 100mg  once a day  We will call with PAP Smear results and labs F/U 2 months for medications

## 2013-10-20 LAB — PAP THINPREP ASCUS RFLX HPV RFLX TYPE

## 2013-10-20 LAB — RPR

## 2013-10-20 LAB — GC/CHLAMYDIA PROBE AMP
CT Probe RNA: NEGATIVE
GC Probe RNA: NEGATIVE

## 2013-10-20 LAB — HIV ANTIBODY (ROUTINE TESTING W REFLEX): HIV: NONREACTIVE

## 2013-12-16 ENCOUNTER — Ambulatory Visit: Payer: Medicaid Other | Admitting: Family Medicine

## 2013-12-20 ENCOUNTER — Ambulatory Visit (INDEPENDENT_AMBULATORY_CARE_PROVIDER_SITE_OTHER): Payer: Medicaid Other | Admitting: Family Medicine

## 2013-12-20 ENCOUNTER — Encounter: Payer: Self-pay | Admitting: Family Medicine

## 2013-12-20 ENCOUNTER — Telehealth: Payer: Self-pay | Admitting: Family Medicine

## 2013-12-20 VITALS — BP 100/64 | HR 78 | Temp 98.0°F | Resp 16 | Wt 165.0 lb

## 2013-12-20 DIAGNOSIS — F411 Generalized anxiety disorder: Secondary | ICD-10-CM

## 2013-12-20 DIAGNOSIS — K649 Unspecified hemorrhoids: Secondary | ICD-10-CM

## 2013-12-20 DIAGNOSIS — K644 Residual hemorrhoidal skin tags: Secondary | ICD-10-CM

## 2013-12-20 MED ORDER — ALPRAZOLAM 0.5 MG PO TABS: 0.5000 mg | ORAL_TABLET | Freq: Three times a day (TID) | ORAL | Status: DC | PRN

## 2013-12-20 MED ORDER — PRAMOXINE HCL 1 % RE FOAM: 1.0000 "application " | Freq: Three times a day (TID) | RECTAL | Status: DC | PRN

## 2013-12-20 NOTE — Telephone Encounter (Signed)
Per Dr. Dennard Schaumann OK to do general surgery consult

## 2013-12-20 NOTE — Telephone Encounter (Signed)
[phone] Patient was seen today for hemorrhoids and she says that the medication is too expensive, would like to know if we can just refer her to get these removed, and if we know if the medicaid would cover the surgery

## 2013-12-20 NOTE — Progress Notes (Signed)
   Subjective:    Patient ID: Alexandria [name], female    DOB: 04-23-1986, 28 y.o.   MRN: [id number]  HPI The patient's last office visit, Dr. Buelah Manis increased her Zoloft 100 mg by mouth daily. Patient is also taking gabapentin for anxiety. She still has breakthrough panic attacks. In the past he took BuSpar with a little benefit. Her panic attacks primarily due to stage fright.  Patient is an Engineer, maintenance (IT). She is very nervous in front of crowds. She like to have a medication that she can use sparingly as needed for anxiety.  At the present time she's not interested in switching off the Zoloft because she felt like otherwise is working fairly well. She also developed a hemorrhage after the birth of her child 8 months ago. There is a small external hemorrhoid. On examination today it is primarily a skin tag. There is no evidence of irritation or inflammation. At times however it becomes painful and inflamed. Past Medical History  Diagnosis Date  . Depression   . Anxiety   . High risk HPV infection     followed by GYN  . CIN III (cervical intraepithelial neoplasia III)    No past surgical history on file. Current Outpatient Prescriptions on File Prior to Visit  Medication Sig Dispense Refill  . gabapentin (NEURONTIN) 300 MG capsule TAKE ONE CAPSULE BY MOUTH 3 TIMES A DAY AS NEEDED FOR ANXIETY  90 capsule  3  . sertraline (ZOLOFT) 100 MG tablet Take 1 tablet (100 mg total) by mouth daily.  30 tablet  6   No current facility-administered medications on file prior to visit.   No Known Allergies History   Social History  . Marital Status: Single    Spouse Name: N/A    Number of Children: N/A  . Years of Education: N/A   Occupational History  . Not on file.   Social History Main Topics  . Smoking status: Current Some Day Smoker  . Smokeless tobacco: Never Used  . Alcohol Use: No  . Drug Use: No  . Sexual Activity: Yes    Birth Control/ Protection: None   Other Topics Concern  .  Not on file   Social History Narrative  . No narrative on file      Review of Systems  All other systems reviewed and are negative.      Objective:   Physical Exam  Vitals reviewed. Neck: Neck supple. No thyromegaly present.  Cardiovascular: Normal rate, regular rhythm and normal heart sounds.   No murmur heard. Pulmonary/Chest: Effort normal and breath sounds normal. No respiratory distress. She has no wheezes. She has no rales.  Abdominal: Soft. Bowel sounds are normal.  Genitourinary: Rectal exam shows external hemorrhoid.  Psychiatric: She has a normal mood and affect. Her behavior is normal. Judgment and thought content normal.          Assessment & Plan:  External hemorrhoid - Plan: pramoxine (PROCTOFOAM) 1 % foam  GAD (generalized anxiety disorder)   Patient can use Xanax 0.5. mg by mouth every 8 hours as needed for panic attacks. I advised the patient uses medication sparingly. I advised her against using medication and drivingI warned her about habituation. Consider switching the patient to Effexor if this is not effective. Positive phone as needed for irritation due to the external hemorrhoid. If persistent and constantly painful, we could refer her to a surgeon for excision.

## 2013-12-21 NOTE — Telephone Encounter (Signed)
Referral ordered and pt aware 

## 2013-12-27 ENCOUNTER — Other Ambulatory Visit (HOSPITAL_COMMUNITY): Payer: Self-pay | Admitting: Obstetrics and Gynecology

## 2013-12-27 DIAGNOSIS — E041 Nontoxic single thyroid nodule: Secondary | ICD-10-CM

## 2013-12-29 ENCOUNTER — Other Ambulatory Visit: Payer: Self-pay | Admitting: Family Medicine

## 2013-12-29 DIAGNOSIS — E041 Nontoxic single thyroid nodule: Secondary | ICD-10-CM

## 2013-12-30 ENCOUNTER — Ambulatory Visit (HOSPITAL_COMMUNITY)
Admission: RE | Admit: 2013-12-30 | Discharge: 2013-12-30 | Disposition: A | Discharge status: Home / Self Care | Payer: Medicaid Other | Source: Ambulatory Visit | Attending: Obstetrics and Gynecology | Admitting: Obstetrics and Gynecology

## 2013-12-30 DIAGNOSIS — E041 Nontoxic single thyroid nodule: Secondary | ICD-10-CM

## 2014-01-03 ENCOUNTER — Other Ambulatory Visit (HOSPITAL_COMMUNITY): Payer: Self-pay | Admitting: Obstetrics and Gynecology

## 2014-01-03 DIAGNOSIS — E041 Nontoxic single thyroid nodule: Secondary | ICD-10-CM

## 2014-01-04 ENCOUNTER — Encounter (HOSPITAL_COMMUNITY): Payer: Self-pay | Admitting: Pharmacy Technician

## 2014-01-09 ENCOUNTER — Encounter (HOSPITAL_COMMUNITY): Payer: Self-pay | Admitting: Emergency Medicine

## 2014-01-09 ENCOUNTER — Emergency Department (HOSPITAL_COMMUNITY)
Admission: EM | Admit: 2014-01-09 | Discharge: 2014-01-09 | Disposition: A | Discharge status: Home / Self Care | Payer: Medicaid Other | Attending: Emergency Medicine | Admitting: Emergency Medicine

## 2014-01-09 DIAGNOSIS — Z862 Personal history of diseases of the blood and blood-forming organs and certain disorders involving the immune mechanism: Secondary | ICD-10-CM | POA: Diagnosis not present

## 2014-01-09 DIAGNOSIS — Z79899 Other long term (current) drug therapy: Secondary | ICD-10-CM | POA: Insufficient documentation

## 2014-01-09 DIAGNOSIS — IMO0002 Reserved for concepts with insufficient information to code with codable children: Secondary | ICD-10-CM | POA: Diagnosis not present

## 2014-01-09 DIAGNOSIS — F329 Major depressive disorder, single episode, unspecified: Secondary | ICD-10-CM | POA: Insufficient documentation

## 2014-01-09 DIAGNOSIS — R059 Cough, unspecified: Secondary | ICD-10-CM | POA: Diagnosis present

## 2014-01-09 DIAGNOSIS — F17200 Nicotine dependence, unspecified, uncomplicated: Secondary | ICD-10-CM | POA: Insufficient documentation

## 2014-01-09 DIAGNOSIS — Z8619 Personal history of other infectious and parasitic diseases: Secondary | ICD-10-CM | POA: Insufficient documentation

## 2014-01-09 DIAGNOSIS — F411 Generalized anxiety disorder: Secondary | ICD-10-CM | POA: Insufficient documentation

## 2014-01-09 DIAGNOSIS — F3289 Other specified depressive episodes: Secondary | ICD-10-CM | POA: Diagnosis not present

## 2014-01-09 DIAGNOSIS — J019 Acute sinusitis, unspecified: Secondary | ICD-10-CM | POA: Insufficient documentation

## 2014-01-09 DIAGNOSIS — Z8639 Personal history of other endocrine, nutritional and metabolic disease: Secondary | ICD-10-CM | POA: Insufficient documentation

## 2014-01-09 DIAGNOSIS — Z8541 Personal history of malignant neoplasm of cervix uteri: Secondary | ICD-10-CM | POA: Diagnosis not present

## 2014-01-09 DIAGNOSIS — R05 Cough: Secondary | ICD-10-CM | POA: Diagnosis present

## 2014-01-09 MED ORDER — GUAIFENESIN 100 MG/5ML PO LIQD: 100.0000 mg | ORAL | Status: DC | PRN

## 2014-01-09 MED ORDER — CETIRIZINE HCL 10 MG PO CAPS: 10.0000 mg | ORAL_CAPSULE | Freq: Every day | ORAL | Status: DC

## 2014-01-09 MED ORDER — FLUTICASONE PROPIONATE 50 MCG/ACT NA SUSP: 2.0000 | Freq: Every day | NASAL | Status: DC

## 2014-01-09 NOTE — Discharge Instructions (Signed)
Sinusitis °Sinusitis is redness, soreness, and puffiness (inflammation) of the air pockets in the bones of your face (sinuses). The redness, soreness, and puffiness can cause air and mucus to get trapped in your sinuses. This can allow germs to grow and cause an infection.  °HOME CARE  °· Drink enough fluids to keep your pee (urine) clear or pale yellow. °· Use a humidifier in your home. °· Run a hot shower to create steam in the bathroom. Sit in the bathroom with the door closed. Breathe in the steam 3-4 times a day. °· Put a warm, moist washcloth on your face 3-4 times a day, or as told by your doctor. °· Use salt water sprays (saline sprays) to wet the thick fluid in your nose. This can help the sinuses drain. °· Only take medicine as told by your doctor. °GET HELP RIGHT AWAY IF:  °· Your pain gets worse. °· You have very bad headaches. °· You are sick to your stomach (nauseous). °· You throw up (vomit). °· You are very sleepy (drowsy) all the time. °· Your face is puffy (swollen). °· Your vision changes. °· You have a stiff neck. °· You have trouble breathing. °MAKE SURE YOU:  °· Understand these instructions. °· Will watch your condition. °· Will get help right away if you are not doing well or get worse. °Document Released: 10/01/2007 Document Revised: 01/07/2012 Document Reviewed: 11/18/2011 °Exit[employer]® Patient Information ©2015 [employer]. This information is not intended to replace advice given to you by your health care provider. Make sure you discuss any questions you have with your health care provider. ° °

## 2014-01-09 NOTE — ED Notes (Signed)
Pt c/o nasal congestion, chest congestion, and cough x 3 days.  Pt reports taking Dayquil and Nyquil w/o relief.  Sts "this feels like an URI."

## 2014-01-09 NOTE — ED Provider Notes (Signed)
CSN: [id number]     Arrival date & time 01/09/14  1442 History  This chart was scribed for non-physician practitioner, Starlyn Skeans, PA-C working with Charlesetta Shanks, MD by Frederich Balding, ED scribe. This patient was seen in room [facility] and the patient's care was started at 4:50 PM.   Chief Complaint  Patient presents with  . Nasal Congestion  . Cough   The history is provided by the patient. No language interpreter was used.   HPI Comments: Alexandria [name] is a 28 y.o. female who presents to the Emergency Department complaining of nasal congestion, chest congestion and productive cough of green sputum that started 3 days ago. Cough is worse at night. Reports associated mild headache, rhinorrhea, postnasal drip, facial pain and nausea. Pt has taken DayQuil and NyQuil with no relief. States she used to take Flonase daily but does not anymore. Denies fever, chills, emesis.   Past Medical History  Diagnosis Date  . Depression   . Anxiety   . High risk HPV infection     followed by GYN  . CIN III (cervical intraepithelial neoplasia III)   . Enlarged thyroid    History reviewed. No pertinent past surgical history. Family History  Problem Relation Age of Onset  . Cancer Mother 18    colon cancer  . Cancer Maternal Aunt     Cervical Cancer  . Cancer Paternal Aunt     Colon cancer  . Asthma Brother    History  Substance Use Topics  . Smoking status: Current Some Day Smoker  . Smokeless tobacco: Never Used  . Alcohol Use: No   OB History   Grav Para Term Preterm Abortions TAB SAB Ect Mult Living   4 3 3  1 1    3      Review of Systems A complete 10 system review of systems was obtained and all systems are negative except as noted in the HPI and PMH.   Allergies  Review of patient's allergies indicates no known allergies.  Home Medications   Prior to Admission medications   Medication Sig Start Date End Date Taking? Authorizing Provider  ALPRAZolam Duanne Moron) 0.5 MG  tablet Take 0.5 mg by mouth 2 (two) times daily as needed for anxiety.   Yes Historical Provider, MD  Cyanocobalamin (VITAMIN B 12 PO) Take 1 tablet by mouth daily.   Yes Historical Provider, MD  DM-Doxylamine-Acetaminophen (VICKS NYQUIL COLD & FLU) 15-6.25-325 MG CAPS Take 2 capsules by mouth at bedtime as needed (for cold).   Yes Historical Provider, MD  DM-Phenylephrine-Acetaminophen (VICKS DAYQUIL COLD & FLU) 10-5-325 MG CAPS Take 1 capsule by mouth daily.   Yes Historical Provider, MD  gabapentin (NEURONTIN) 300 MG capsule Take 300 mg by mouth 4 (four) times daily as needed (for anxiety).    Yes Historical Provider, MD  Multiple Vitamins-Minerals (MULTIVITAMIN PO) Take 1 tablet by mouth every morning.    Yes Historical Provider, MD  Norgestimate-Ethinyl Estradiol Triphasic (ORTHO TRI-CYCLEN LO) 0.18/0.215/0.25 MG-25 MCG tab Take 1 tablet by mouth daily.   Yes Historical Provider, MD  sertraline (ZOLOFT) 100 MG tablet Take 1 tablet (100 mg total) by mouth daily. 10/19/13  Yes Alycia Rossetti, MD  Cetirizine HCl (ZYRTEC ALLERGY) 10 MG CAPS Take 1 capsule (10 mg total) by mouth at bedtime. 01/09/14   Tiyonna Sardinha A Forcucci, PA-C  fluticasone (FLONASE) 50 MCG/ACT nasal spray Place 2 sprays into both nostrils daily. 01/09/14   Georgetta Crafton A Forcucci, PA-C  guaiFENesin (ROBITUSSIN) 100  MG/5ML liquid Take 5-10 mLs (100-200 mg total) by mouth every 4 (four) hours as needed for cough. 01/09/14   Benetta Maclaren A Forcucci, PA-C   BP 110/65  Pulse 74  Temp(Src) 97.8 F (36.6 C) (Oral)  Resp 20  SpO2 100%  Physical Exam  Nursing note and vitals reviewed. Constitutional: She is oriented to person, place, and time. She appears well-developed and well-nourished. No distress.  HENT:  Head: Normocephalic and atraumatic.  Right Ear: Tympanic membrane normal.  Left Ear: Tympanic membrane normal.  Nose: Mucosal edema present.  Mouth/Throat: Oropharynx is clear and moist. No oropharyngeal exudate, posterior  oropharyngeal edema or posterior oropharyngeal erythema.  Congestion.  Eyes: Conjunctivae and EOM are normal.  Neck: Neck supple.  Mild right lower thyromegaly.  Cardiovascular: Normal rate, regular rhythm and normal heart sounds.  Exam reveals no gallop and no friction rub.   No murmur heard. Pulmonary/Chest: Effort normal and breath sounds normal. No respiratory distress. She has no wheezes. She has no rhonchi. She has no rales.  Musculoskeletal: Normal range of motion.  Lymphadenopathy:    She has no cervical adenopathy.  Neurological: She is alert and oriented to person, place, and time.  Skin: Skin is warm and dry.  Psychiatric: She has a normal mood and affect. Her behavior is normal.    ED Course  Procedures (including critical care time)  DIAGNOSTIC STUDIES: Oxygen Saturation is 100% on RA, normal by my interpretation.    COORDINATION OF CARE: 4:55 PM-Discussed treatment plan which includes saline nasal spray, Flonase and an allergy medication with pt at bedside and pt agreed to plan.   Labs Review Labs Reviewed - No data to display  Imaging Review No results found.   EKG Interpretation None      MDM   Final diagnoses:  Acute sinusitis, recurrence not specified, unspecified location   Patient is a 28 y.o. Female who presents to the ED with congestion and cough.  Physical exam reveals congestion and mucosal edema.  Patient is afebrile.  Lungs are clear to ausculation.  Doubt PNA at this time.  I do not feel CXR is warranted.  Suspect allergic rhinitis vs. Sinusitis.  Will treat symptomatically with tylenol/ibuprofen for myalgias, robitussin, nasal saline, flonase, and zyrtec.  Patient to follow-up with her PCP in 3-4 days.  Patient states understanding and agreement.  Patient to return for PNA symtoms.  Patient stable for discharge.     I personally performed the services described in this documentation, which was scribed in my presence. The recorded information has  been reviewed and is accurate.  Cherylann Parr, PA-C 01/09/14 1724

## 2014-01-09 NOTE — ED Provider Notes (Signed)
Medical screening examination/treatment/procedure(s) were performed by non-physician practitioner and as supervising physician I was immediately available for consultation/collaboration.   EKG Interpretation None       Charlesetta Shanks, MD 01/09/14 2316

## 2014-01-10 ENCOUNTER — Ambulatory Visit (INDEPENDENT_AMBULATORY_CARE_PROVIDER_SITE_OTHER): Payer: Medicaid Other | Admitting: General Surgery

## 2014-01-10 ENCOUNTER — Other Ambulatory Visit: Payer: Self-pay | Admitting: Radiology

## 2014-01-11 ENCOUNTER — Other Ambulatory Visit: Payer: Self-pay | Admitting: Radiology

## 2014-01-12 ENCOUNTER — Ambulatory Visit (HOSPITAL_COMMUNITY)
Admission: RE | Admit: 2014-01-12 | Discharge: 2014-01-12 | Disposition: A | Discharge status: Home / Self Care | Payer: Medicaid Other | Source: Ambulatory Visit | Attending: Obstetrics and Gynecology | Admitting: Obstetrics and Gynecology

## 2014-01-12 DIAGNOSIS — E049 Nontoxic goiter, unspecified: Secondary | ICD-10-CM | POA: Insufficient documentation

## 2014-01-12 DIAGNOSIS — E041 Nontoxic single thyroid nodule: Secondary | ICD-10-CM | POA: Diagnosis present

## 2014-01-12 DIAGNOSIS — D34 Benign neoplasm of thyroid gland: Secondary | ICD-10-CM | POA: Insufficient documentation

## 2014-01-12 MED ORDER — LIDOCAINE HCL (PF) 1 % IJ SOLN
INTRAMUSCULAR | Status: AC
  Filled 2014-01-12: qty 10

## 2014-01-12 NOTE — Procedures (Signed)
Successful US guided right thyroid FNA x 4 No complications  Ascencion Dike PA-C Interventional Radiology 01/12/2014 10:54 AM

## 2014-01-28 ENCOUNTER — Encounter (HOSPITAL_COMMUNITY): Payer: Self-pay | Admitting: Emergency Medicine

## 2014-01-28 ENCOUNTER — Emergency Department (HOSPITAL_COMMUNITY)
Admission: EM | Admit: 2014-01-28 | Discharge: 2014-01-29 | Disposition: A | Discharge status: Home / Self Care | Payer: Medicaid Other | Attending: Emergency Medicine | Admitting: Emergency Medicine

## 2014-01-28 DIAGNOSIS — Z720 Tobacco use: Secondary | ICD-10-CM | POA: Diagnosis not present

## 2014-01-28 DIAGNOSIS — B3731 Acute candidiasis of vulva and vagina: Secondary | ICD-10-CM

## 2014-01-28 DIAGNOSIS — Z8639 Personal history of other endocrine, nutritional and metabolic disease: Secondary | ICD-10-CM | POA: Diagnosis not present

## 2014-01-28 DIAGNOSIS — Y9389 Activity, other specified: Secondary | ICD-10-CM | POA: Insufficient documentation

## 2014-01-28 DIAGNOSIS — F419 Anxiety disorder, unspecified: Secondary | ICD-10-CM | POA: Insufficient documentation

## 2014-01-28 DIAGNOSIS — Z3202 Encounter for pregnancy test, result negative: Secondary | ICD-10-CM | POA: Diagnosis not present

## 2014-01-28 DIAGNOSIS — W010XXA Fall on same level from slipping, tripping and stumbling without subsequent striking against object, initial encounter: Secondary | ICD-10-CM | POA: Insufficient documentation

## 2014-01-28 DIAGNOSIS — Z8541 Personal history of malignant neoplasm of cervix uteri: Secondary | ICD-10-CM | POA: Insufficient documentation

## 2014-01-28 DIAGNOSIS — S8002XA Contusion of left knee, initial encounter: Secondary | ICD-10-CM | POA: Diagnosis not present

## 2014-01-28 DIAGNOSIS — Z8619 Personal history of other infectious and parasitic diseases: Secondary | ICD-10-CM | POA: Diagnosis not present

## 2014-01-28 DIAGNOSIS — F329 Major depressive disorder, single episode, unspecified: Secondary | ICD-10-CM | POA: Insufficient documentation

## 2014-01-28 DIAGNOSIS — Z79899 Other long term (current) drug therapy: Secondary | ICD-10-CM | POA: Insufficient documentation

## 2014-01-28 DIAGNOSIS — S8992XA Unspecified injury of left lower leg, initial encounter: Secondary | ICD-10-CM | POA: Diagnosis present

## 2014-01-28 DIAGNOSIS — B373 Candidiasis of vulva and vagina: Secondary | ICD-10-CM | POA: Diagnosis not present

## 2014-01-28 DIAGNOSIS — Y9289 Other specified places as the place of occurrence of the external cause: Secondary | ICD-10-CM | POA: Diagnosis not present

## 2014-01-28 NOTE — ED Notes (Signed)
Patient arrives with complaint of left leg pain secondary to slipping on sisters porch 2 days ago. States that she was made to do a split when she slid. Her right leg received a similar injury since that time as well, but is not currently hurting. 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 Additionally states that she has been experiencing yeast infection symptoms with discharge. Has been using OTC treatments which have been helping the pain, but have not relieved the yeast infection. States the yeast infection has been present for 4 days.

## 2014-01-29 ENCOUNTER — Emergency Department (HOSPITAL_COMMUNITY): Payer: Medicaid Other

## 2014-01-29 LAB — URINALYSIS, ROUTINE W REFLEX MICROSCOPIC
Bilirubin Urine: NEGATIVE
Glucose, UA: NEGATIVE mg/dL
Hgb urine dipstick: NEGATIVE
KETONES UR: NEGATIVE mg/dL
Nitrite: NEGATIVE
PROTEIN: NEGATIVE mg/dL
Specific Gravity, Urine: 1.022 (ref 1.005–1.030)
UROBILINOGEN UA: 1 mg/dL (ref 0.0–1.0)
pH: 6.5 (ref 5.0–8.0)

## 2014-01-29 LAB — URINE MICROSCOPIC-ADD ON

## 2014-01-29 LAB — PREGNANCY, URINE: PREG TEST UR: NEGATIVE

## 2014-01-29 LAB — RPR

## 2014-01-29 LAB — WET PREP, GENITAL: Trich, Wet Prep: NONE SEEN

## 2014-01-29 LAB — HIV ANTIBODY (ROUTINE TESTING W REFLEX): HIV 1&2 Ab, 4th Generation: NONREACTIVE

## 2014-01-29 MED ORDER — FLUCONAZOLE 100 MG PO TABS
150.0000 mg | ORAL_TABLET | Freq: Once | ORAL | Status: AC
  Administered 2014-01-29: 150 mg via ORAL
  Filled 2014-01-29: qty 2

## 2014-01-29 MED ORDER — MICONAZOLE NITRATE 1200 & 2 MG & % VA KIT: 1.0000 | PACK | Freq: Once | VAGINAL | Status: DC

## 2014-01-29 MED ORDER — CLOTRIMAZOLE 1 % VA CREA
1.0000 | TOPICAL_CREAM | Freq: Every day | VAGINAL | Status: DC
  Administered 2014-01-29: 1 via VAGINAL
  Filled 2014-01-29: qty 45

## 2014-01-29 NOTE — ED Provider Notes (Signed)
CSN: [id number]     Arrival date & time 01/28/14  2041 History   First MD Initiated Contact with Patient 01/28/14 2301     Chief Complaint  Patient presents with  . Knee Pain  . Vaginal Discharge     (Consider location/radiation/quality/duration/timing/severity/associated sxs/prior Treatment) HPI 28 year old female presents to the emergency room with 2 complaints.  Patient complains of left knee pain after falling onto it 2 days ago.  Patient works as a Tourist information centre manager, and is having difficulty at work dancing and heels do to pain.  She denies any laxity swelling or other knee complaints.  She is able walk normally, but mainly has problems when wearing high heels.  Patient's second complaint is 4 days of increased vaginal discharge.  Patient has a new sexual partner, and does not use condoms.  She reports that she was tested for STDs prior to having her new partner.  She denies any pelvic pain.  Patient has used over-the-counter treatments without improvement.  She denies any dysuria Past Medical History  Diagnosis Date  . Depression   . Anxiety   . High risk HPV infection     followed by GYN  . CIN III (cervical intraepithelial neoplasia III)   . Enlarged thyroid    History reviewed. No pertinent past surgical history. Family History  Problem Relation Age of Onset  . Cancer Mother 58    colon cancer  . Cancer Maternal Aunt     Cervical Cancer  . Cancer Paternal Aunt     Colon cancer  . Asthma Brother    History  Substance Use Topics  . Smoking status: Current Some Day Smoker  . Smokeless tobacco: Never Used  . Alcohol Use: No   OB History   Grav Para Term Preterm Abortions TAB SAB Ect Mult Living   '4 3 3  1 1    3     '$ Review of Systems  See History of Present Illness; otherwise all other systems are reviewed and negative   Allergies  Review of patient's allergies indicates no known allergies.  Home Medications   Prior to Admission medications   Medication Sig Start Date  End Date Taking? Authorizing Provider  ALPRAZolam Duanne Moron) 0.5 MG tablet Take 0.5 mg by mouth 2 (two) times daily as needed for anxiety.   Yes Historical Provider, MD  cetirizine (ZYRTEC) 10 MG tablet Take 10 mg by mouth daily as needed for allergies.   Yes Historical Provider, MD  gabapentin (NEURONTIN) 300 MG capsule Take 300 mg by mouth 4 (four) times daily as needed (for anxiety).    Yes Historical Provider, MD  ibuprofen (ADVIL,MOTRIN) 200 MG tablet Take 400 mg by mouth daily as needed for headache.   Yes Historical Provider, MD  Multiple Vitamins-Minerals (MULTIVITAMIN PO) Take 1 tablet by mouth every morning.    Yes Historical Provider, MD  Norgestimate-Ethinyl Estradiol Triphasic (ORTHO TRI-CYCLEN LO) 0.18/0.215/0.25 MG-25 MCG tab Take 1 tablet by mouth daily.   Yes Historical Provider, MD  sertraline (ZOLOFT) 100 MG tablet Take 100 mg by mouth daily.   Yes Historical Provider, MD  miconazole (MONISTAT 1 COMBO PACK) kit Place 1 each vaginally once. 01/29/14   Kalman Drape, MD   BP 102/61  Pulse 57  Temp(Src) 97.8 F (36.6 C) (Oral)  Resp 19  SpO2 99%  LMP 01/22/2014  Breastfeeding? No Physical Exam  Nursing note and vitals reviewed. Constitutional: She is oriented to person, place, and time. She appears well-developed and well-nourished.  HENT:  Head: Normocephalic and atraumatic.  Nose: Nose normal.  Mouth/Throat: Oropharynx is clear and moist.  Eyes: Conjunctivae and EOM are normal. Pupils are equal, round, and reactive to light.  Neck: Normal range of motion. Neck supple. No JVD present. No tracheal deviation present. No thyromegaly present.  Cardiovascular: Normal rate, regular rhythm, normal heart sounds and intact distal pulses.  Exam reveals no gallop and no friction rub.   No murmur heard. Pulmonary/Chest: Effort normal and breath sounds normal. No stridor. No respiratory distress. She has no wheezes. She has no rales. She exhibits no tenderness.  Abdominal: Soft. Bowel  sounds are normal. She exhibits no distension and no mass. There is no tenderness. There is no rebound and no guarding.  Genitourinary:  External genitalia normal Vagina with discharge Cervix  normal negative for cervical motion tenderness Adnexa palpated, no masses or negative for tenderness noted Bladder palpated negative for tenderness Uterus palpated no masses or negative for tenderness    Musculoskeletal: Normal range of motion. She exhibits tenderness (patient has contusion at lower end of left patella.  There is no crepitus, effusion or difficulties with range of motion). She exhibits no edema.  Lymphadenopathy:    She has no cervical adenopathy.  Neurological: She is alert and oriented to person, place, and time. She displays normal reflexes. She exhibits normal muscle tone. Coordination normal.  Skin: Skin is warm and dry. No rash noted. No erythema. No pallor.  Psychiatric: She has a normal mood and affect. Her behavior is normal. Judgment and thought content normal.    ED Course  Procedures (including critical care time) Labs Review Labs Reviewed  WET PREP, GENITAL - Abnormal; Notable for the following:    Yeast Wet Prep HPF POC MODERATE (*)    Clue Cells Wet Prep HPF POC FEW (*)    WBC, Wet Prep HPF POC FEW (*)    All other components within normal limits  URINALYSIS, ROUTINE W REFLEX MICROSCOPIC - Abnormal; Notable for the following:    APPearance CLOUDY (*)    Leukocytes, UA TRACE (*)    All other components within normal limits  URINE MICROSCOPIC-ADD ON - Abnormal; Notable for the following:    Squamous Epithelial / LPF FEW (*)    Bacteria, UA FEW (*)    All other components within normal limits  GC/CHLAMYDIA PROBE AMP  PREGNANCY, URINE  RPR  HIV ANTIBODY (ROUTINE TESTING)    Imaging Review Dg Knee Complete 4 Views Left  01/29/2014   CLINICAL DATA:  Slipped on a wet neck this evening, striking the left knee. Left anterior knee pain and bruising.  EXAM: LEFT  KNEE - COMPLETE 4+ VIEW  COMPARISON:  None.  FINDINGS: There is no evidence of fracture, dislocation, or joint effusion. There is no evidence of arthropathy or other focal bone abnormality. Soft tissues are unremarkable.  IMPRESSION: Negative.   Electronically Signed   By: Lucienne Capers M.D.   On: 01/29/2014 01:35     EKG Interpretation None      MDM   Final diagnoses:  Knee contusion, left, initial encounter  Vaginal candidiasis    28 year old female with left knee pain after fall, x-rays negative contusion only.  Work note to be given.  Patient also with vaginal discharge, yeast noted on wet prep.  GC Chlamydia RPR HIV also sent.  Patient advised these results would be delayed for a few days, and she would be contacted if they became positive.  Plan to treat with  fluconazole  and Monistat when necessary    Kalman Drape, MD 01/29/14 [phone]

## 2014-01-29 NOTE — Discharge Instructions (Signed)
You were given oral treatment for vaginal yeast infection.  Use the monistat prescription if symptoms are not improving in 2-3 days.  The xrays of your knee showed no fracture or other abnormality.  Tylenol or Motrin for pain.  Return to the ER for worsening condition or new concerning symptoms.   Candidal Vulvovaginitis Candidal vulvovaginitis is an infection of the vagina and vulva. The vulva is the skin around the opening of the vagina. This may cause itching and discomfort in and around the vagina.  HOME CARE  Only take medicine as told by your doctor.  Do not have sex (intercourse) until the infection is healed or as told by your doctor.  Practice safe sex.  Tell your sex partner about your infection.  Do not douche or use tampons.  Wear cotton underwear. Do not wear tight pants or panty hose.  Eat yogurt. This may help treat and prevent yeast infections. GET HELP RIGHT AWAY IF:   You have a fever.  Your problems get worse during treatment or do not get better in 3 days.  You have discomfort, irritation, or itching in your vagina or vulva area.  You have pain after sex.  You start to get belly (abdominal) pain. MAKE SURE YOU:  Understand these instructions.  Will watch your condition.  Will get help right away if you are not doing well or get worse. Document Released: 07/11/2008 Document Revised: 04/19/2013 Document Reviewed: 07/11/2008 [facility] Patient Information 2015 [place], [place]. This information is not intended to replace advice given to you by your health care provider. Make sure you discuss any questions you have with your health care provider.  Contusion A contusion is a deep bruise. Contusions are the result of an injury that caused bleeding under the skin. The contusion may turn blue, purple, or yellow. Minor injuries will give you a painless contusion, but more severe contusions may stay painful and swollen for a few weeks.  CAUSES  A contusion is  usually caused by a blow, trauma, or direct force to an area of the body. SYMPTOMS   Swelling and redness of the injured area.  Bruising of the injured area.  Tenderness and soreness of the injured area.  Pain. DIAGNOSIS  The diagnosis can be made by taking a history and physical exam. An X-ray, CT scan, or MRI may be needed to determine if there were any associated injuries, such as fractures. TREATMENT  Specific treatment will depend on what area of the body was injured. In general, the best treatment for a contusion is resting, icing, elevating, and applying cold compresses to the injured area. Over-the-counter medicines may also be recommended for pain control. Ask your caregiver what the best treatment is for your contusion. HOME CARE INSTRUCTIONS   Put ice on the injured area.  Put ice in a plastic bag.  Place a towel between your skin and the bag.  Leave the ice on for 15-20 minutes, 3-4 times a day, or as directed by your health care provider.  Only take over-the-counter or prescription medicines for pain, discomfort, or fever as directed by your caregiver. Your caregiver may recommend avoiding anti-inflammatory medicines (aspirin, ibuprofen, and naproxen) for 48 hours because these medicines may increase bruising.  Rest the injured area.  If possible, elevate the injured area to reduce swelling. SEEK IMMEDIATE MEDICAL CARE IF:   You have increased bruising or swelling.  You have pain that is getting worse.  Your swelling or pain is not relieved with medicines.  MAKE SURE YOU:   Understand these instructions.  Will watch your condition.  Will get help right away if you are not doing well or get worse. Document Released: 01/22/2005 Document Revised: 04/19/2013 Document Reviewed: 02/17/2011 [facility] Patient Information 2015 [place], [place]. This information is not intended to replace advice given to you by your health care provider. Make sure you discuss any questions  you have with your health care provider.

## 2014-01-30 ENCOUNTER — Other Ambulatory Visit: Payer: Self-pay | Admitting: Family Medicine

## 2014-01-30 LAB — GC/CHLAMYDIA PROBE AMP
CT PROBE, AMP APTIMA: NEGATIVE
GC PROBE AMP APTIMA: NEGATIVE

## 2014-01-30 NOTE — Telephone Encounter (Signed)
Ok to refill 

## 2014-01-30 NOTE — Telephone Encounter (Signed)
ok 

## 2014-02-01 ENCOUNTER — Other Ambulatory Visit: Payer: Self-pay | Admitting: Family Medicine

## 2014-02-02 NOTE — Telephone Encounter (Signed)
Xanax has been called in on 02/01/2014.  Gabapentin filled.

## 2014-02-27 ENCOUNTER — Encounter (HOSPITAL_COMMUNITY): Payer: Self-pay | Admitting: Emergency Medicine

## 2014-03-03 ENCOUNTER — Other Ambulatory Visit: Payer: Self-pay | Admitting: Family Medicine

## 2014-03-03 NOTE — Telephone Encounter (Signed)
?   OK to Refill  

## 2014-03-06 NOTE — Telephone Encounter (Signed)
ok 

## 2014-03-06 NOTE — Telephone Encounter (Signed)
Medication called to pharmacy. 

## 2014-03-20 DIAGNOSIS — E042 Nontoxic multinodular goiter: Secondary | ICD-10-CM | POA: Insufficient documentation

## 2014-04-28 NOTE — L&D Delivery Note (Signed)
Deliver Note   Date of Delivery:   02/08/2015 Primary OB:   [facility] Gestational Age/EDD: [redacted]w[redacted]d by 02/07/2015, by Ultrasound  Antepartum complications:  OB History    Gravida Para Term Preterm AB TAB SAB Ectopic Multiple Living   5 4 4  1 1    0 2      Delivered By:   Malachy Mood MD  Delivery Type:   TSVD Anesthesia:     Epidural  Intrapartum complications:  GBS:    Positive (09/16 0000) Laceration:    None Episiotomy:    none Placenta:    Spontaneous Estimated Blood Loss:  24mL Baby:    Liveborn female,  APGAR (1 MIN): 9 APGAR (5 MINS): 9 weight 7#14.6oz   Deliver Details   At  2245 a female was delivered via TSVD(Presentation:OA   ).  APGAR: 9, 9; weight 7#14.6oz  Placenta status: spontaneous. intact Cord:  3 vessel without complications: Marland Kitchen    Mom to postpartum.  Baby to Couplet care / Skin to Skin.

## 2014-05-11 ENCOUNTER — Other Ambulatory Visit: Payer: Self-pay | Admitting: Family Medicine

## 2014-05-11 NOTE — Telephone Encounter (Signed)
rx called in

## 2014-05-11 NOTE — Telephone Encounter (Signed)
ok 

## 2014-05-11 NOTE — Telephone Encounter (Signed)
LRF 03/06/14 #30.  LOV 12/20/13

## 2014-06-19 ENCOUNTER — Other Ambulatory Visit: Payer: Self-pay | Admitting: Family Medicine

## 2014-06-19 NOTE — Telephone Encounter (Signed)
Refill appropriate and filled per protocol. 

## 2014-06-19 NOTE — Telephone Encounter (Signed)
Ok to refill??  Last office visit 12/20/2013.  Last refill 05/11/2014.

## 2014-06-19 NOTE — Telephone Encounter (Signed)
Pt needs OV, give 30 tabs only, no refills until seen

## 2014-06-20 NOTE — Telephone Encounter (Signed)
Letter sent.   Medication called to pharmacy.

## 2014-06-22 ENCOUNTER — Ambulatory Visit (INDEPENDENT_AMBULATORY_CARE_PROVIDER_SITE_OTHER): Payer: Medicaid Other

## 2014-06-22 DIAGNOSIS — N926 Irregular menstruation, unspecified: Secondary | ICD-10-CM

## 2014-06-22 DIAGNOSIS — Z3201 Encounter for pregnancy test, result positive: Secondary | ICD-10-CM

## 2014-06-22 LAB — POCT PREGNANCY, URINE: Preg Test, Ur: POSITIVE — AB

## 2014-06-22 NOTE — Progress Notes (Signed)
Patient here today for pregnancy test. Reports LMP 04/22/15. UPT positive. Based on LMP, patient [redacted]w[redacted]d today with a due date of 01/26/15. Denies any bleeding, pain or problems. Advised patient begin PNV and seek care at [facility] should she develop any pain or bright red vaginal bleeding. Letter of pregnancy verification given. Patient to have initial labs drawn today and return for NOB visit, low risk clinic. Informed her pap will be done at that time. Patient verbalized understanding and gratitude to all. No questions or concerns.

## 2014-06-23 LAB — PRENATAL PROFILE (SOLSTAS)
ANTIBODY SCREEN: NEGATIVE
BASOS PCT: 0 % (ref 0–1)
Basophils Absolute: 0 10*3/uL (ref 0.0–0.1)
Eosinophils Absolute: 0.3 10*3/uL (ref 0.0–0.7)
Eosinophils Relative: 3 % (ref 0–5)
HCT: 38.7 % (ref 36.0–46.0)
HEMOGLOBIN: 13.2 g/dL (ref 12.0–15.0)
HIV 1&2 Ab, 4th Generation: NONREACTIVE
Hepatitis B Surface Ag: NEGATIVE
LYMPHS PCT: 35 % (ref 12–46)
Lymphs Abs: 2.9 10*3/uL (ref 0.7–4.0)
MCH: 28.6 pg (ref 26.0–34.0)
MCHC: 34.1 g/dL (ref 30.0–36.0)
MCV: 83.9 fL (ref 78.0–100.0)
MONO ABS: 0.7 10*3/uL (ref 0.1–1.0)
MPV: 9.6 fL (ref 8.6–12.4)
Monocytes Relative: 8 % (ref 3–12)
Neutro Abs: 4.5 10*3/uL (ref 1.7–7.7)
Neutrophils Relative %: 54 % (ref 43–77)
Platelets: 309 10*3/uL (ref 150–400)
RBC: 4.61 MIL/uL (ref 3.87–5.11)
RDW: 12.8 % (ref 11.5–15.5)
RH TYPE: NEGATIVE
Rubella: 1.75 Index — ABNORMAL HIGH (ref <0.90)
WBC: 8.4 10*3/uL (ref 4.0–10.5)

## 2014-06-23 LAB — PRESCRIPTION MONITORING PROFILE (19 PANEL)
AMPHETAMINE/METH: NEGATIVE ng/mL
BARBITURATE SCREEN, URINE: NEGATIVE ng/mL
BENZODIAZEPINE SCREEN, URINE: NEGATIVE ng/mL
Buprenorphine, Urine: NEGATIVE ng/mL
Cannabinoid Scrn, Ur: NEGATIVE ng/mL
Carisoprodol, Urine: NEGATIVE ng/mL
Cocaine Metabolites: NEGATIVE ng/mL
Creatinine, Urine: 33.81 mg/dL (ref >20.0)
FENTANYL URINE: NEGATIVE ng/mL
MDMA URINE: NEGATIVE ng/mL
MEPERIDINE UR: NEGATIVE ng/mL
Methadone Screen, Urine: NEGATIVE ng/mL
Methaqualone: NEGATIVE ng/mL
NITRITES URINE, INITIAL: NEGATIVE ug/mL
OPIATE SCREEN, URINE: NEGATIVE ng/mL
Oxycodone Screen, Ur: NEGATIVE ng/mL
PH URINE, INITIAL: 7.3 pH (ref 4.5–8.9)
PROPOXYPHENE: NEGATIVE ng/mL
Phencyclidine, Ur: NEGATIVE ng/mL
TRAMADOL UR: NEGATIVE ng/mL
Tapentadol, urine: NEGATIVE ng/mL
ZOLPIDEM, URINE: NEGATIVE ng/mL

## 2014-06-23 LAB — CULTURE, OB URINE
Colony Count: NO GROWTH
ORGANISM ID, BACTERIA: NO GROWTH

## 2014-07-17 ENCOUNTER — Other Ambulatory Visit (HOSPITAL_COMMUNITY)
Admission: RE | Admit: 2014-07-17 | Discharge: 2014-07-17 | Disposition: A | Discharge status: Home / Self Care | Payer: Medicaid Other | Source: Ambulatory Visit | Attending: Family Medicine | Admitting: Family Medicine

## 2014-07-17 ENCOUNTER — Other Ambulatory Visit: Payer: Self-pay | Admitting: Family Medicine

## 2014-07-17 ENCOUNTER — Ambulatory Visit (INDEPENDENT_AMBULATORY_CARE_PROVIDER_SITE_OTHER): Payer: Medicaid Other | Admitting: Family Medicine

## 2014-07-17 ENCOUNTER — Encounter: Payer: Self-pay | Admitting: Family Medicine

## 2014-07-17 VITALS — BP 120/75 | HR 91 | Temp 97.9°F | Wt 181.7 lb

## 2014-07-17 DIAGNOSIS — Z01419 Encounter for gynecological examination (general) (routine) without abnormal findings: Secondary | ICD-10-CM | POA: Diagnosis not present

## 2014-07-17 DIAGNOSIS — D069 Carcinoma in situ of cervix, unspecified: Secondary | ICD-10-CM

## 2014-07-17 DIAGNOSIS — Z3490 Encounter for supervision of normal pregnancy, unspecified, unspecified trimester: Secondary | ICD-10-CM | POA: Insufficient documentation

## 2014-07-17 DIAGNOSIS — Z113 Encounter for screening for infections with a predominantly sexual mode of transmission: Secondary | ICD-10-CM | POA: Insufficient documentation

## 2014-07-17 DIAGNOSIS — Z118 Encounter for screening for other infectious and parasitic diseases: Secondary | ICD-10-CM

## 2014-07-17 DIAGNOSIS — Z9119 Patient's noncompliance with other medical treatment and regimen: Secondary | ICD-10-CM

## 2014-07-17 DIAGNOSIS — Z3401 Encounter for supervision of normal first pregnancy, first trimester: Secondary | ICD-10-CM | POA: Diagnosis not present

## 2014-07-17 DIAGNOSIS — Z3400 Encounter for supervision of normal first pregnancy, unspecified trimester: Secondary | ICD-10-CM | POA: Insufficient documentation

## 2014-07-17 DIAGNOSIS — Z1151 Encounter for screening for human papillomavirus (HPV): Secondary | ICD-10-CM

## 2014-07-17 DIAGNOSIS — Z3682 Encounter for antenatal screening for nuchal translucency: Secondary | ICD-10-CM

## 2014-07-17 DIAGNOSIS — Z91199 Patient's noncompliance with other medical treatment and regimen due to unspecified reason: Secondary | ICD-10-CM

## 2014-07-17 DIAGNOSIS — E041 Nontoxic single thyroid nodule: Secondary | ICD-10-CM

## 2014-07-17 LAB — POCT URINALYSIS DIP (DEVICE)
BILIRUBIN URINE: NEGATIVE
GLUCOSE, UA: NEGATIVE mg/dL
HGB URINE DIPSTICK: NEGATIVE
KETONES UR: NEGATIVE mg/dL
Leukocytes, UA: NEGATIVE
Nitrite: NEGATIVE
PH: 6 (ref 5.0–8.0)
PROTEIN: NEGATIVE mg/dL
SPECIFIC GRAVITY, URINE: 1.01 (ref 1.005–1.030)
Urobilinogen, UA: 0.2 mg/dL (ref 0.0–1.0)

## 2014-07-17 NOTE — Progress Notes (Signed)
Initial OB appointment Declined flu vaccine New OB educational packet given Home Medicaid Form complete

## 2014-07-17 NOTE — Progress Notes (Signed)
Called MFM to schedule first screen. Patient's last day to have first screen is 3/28 and they have no availability before then. Will notify patient

## 2014-07-17 NOTE — Progress Notes (Signed)
Subjective:    Alexandria [name] is a [id number] [redacted]w[redacted]d being seen today for her first obstetrical visit.  Her obstetrical history is significant for denies. Patient does intend to breast feed. Pregnancy history fully reviewed.  Hx of abnormal pap Mother recently died of colon cancer, 1st diagnosed in 43s Hx of thyroid nodule with equivocal biopsy in September, was supposed to have f/u but mom passed away in 2023-05-13 and declines follow up at  Patient reports nausea, no bleeding, no contractions, no cramping and no leaking.  Filed Vitals:   07/17/14 1455  BP: 120/75  Pulse: 91  Temp: 97.9 F (36.6 C)  Weight: 181 lb 11.2 oz (82.419 kg)    HISTORY: OB History  Gravida Para Term Preterm AB SAB TAB Ectopic Multiple Living  5 3 3  1  1   3     # Outcome Date GA Lbr Len/2nd Weight Sex Delivery Anes PTL Lv  5 Current           4 Term 03/23/13 [redacted]w[redacted]d 00:25 / 00:13 8 lb 5.7 oz (3.79 kg) [address] EPI  Y  3 Term 09/17/08 [redacted]w[redacted]d  7 lb 12 oz (3.515 kg) M Vag-Spont EPI    2 Term 08/27/06 [redacted]w[redacted]d  7 lb 12 oz (3.515 kg) M Vag-Spont EPI    1 TAB              Past Medical History  Diagnosis Date  . Depression   . Anxiety   . High risk HPV infection     followed by GYN  . CIN III (cervical intraepithelial neoplasia III)   . Enlarged thyroid    History reviewed. No pertinent past surgical history. Family History  Problem Relation Age of Onset  . Cancer Mother 26    colon cancer  . Cancer Maternal Aunt     Cervical Cancer  . Cancer Paternal Aunt     Colon cancer  . Asthma Brother      Exam    Uterus:     Pelvic Exam:    Perineum: No Hemorrhoids   Vulva: normal   Vagina:  normal mucosa  System: Breast:  normal appearance, no masses or tenderness   Skin: normal coloration and turgor, no rashes    Neurologic: oriented, normal   Extremities: normal strength, tone, and muscle mass   HEENT PERRLA and extra ocular movement intact   Mouth/Teeth mucous membranes moist, pharynx  normal without lesions   Neck supple and no masses   Cardiovascular: Regular rate    Respiratory:  appears well, vitals normal, no respiratory distress, acyanotic, normal RR, ear and throat exam is normal, neck free of mass or lymphadenopathy, chest clear, no wheezing, crepitations, rhonchi, normal symmetric air entry   Abdomen: soft, non-tender; bowel sounds normal; no masses,  no organomegaly   Urinary: urethral meatus normal      Assessment:    Pregnancy: [id number] Patient Active Problem List   Diagnosis Date Noted  . Indication for care in labor or delivery 03/23/2013  . SVD (spontaneous vaginal delivery) 03/23/2013  . Vision changes 01/06/2013  . 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 Anxiety 10/06/2012  . Rh negative state in antepartum period 08/09/2012  . CIN III (cervical intraepithelial neoplasia grade III) with severe dysplasia 08/06/2012  . Supervision of normal pregnancy 08/06/2012        Plan:     Initial labs drawn. Prenatal vitamins. Problem list reviewed and updated. Genetic Screening discussed Quad Screen: to be ordered at  next appointment.  Ultrasound discussed; fetal survey: requested.  Follow up in 4 weeks. 50% of 25 min visit spent on counseling and coordination of care.    Rh neg: rhogam at 28w Thyroid nodule: will order follow up sono, TSH, free T4/T3 Maternal hx of colon cancer: discussed need for colon cancer screening starting in 65s     Kyrene Longan ROCIO 07/17/2014

## 2014-07-19 ENCOUNTER — Encounter: Payer: Self-pay | Admitting: *Deleted

## 2014-07-19 ENCOUNTER — Telehealth: Payer: Self-pay | Admitting: *Deleted

## 2014-07-19 LAB — CYTOLOGY - PAP

## 2014-07-19 NOTE — Telephone Encounter (Signed)
-----   Message from Nila Nephew, MD sent at 07/18/2014  1:39 PM EDT ----- Pt needs thyroid studies and I forgot to add to prenatal labs.  Please have pt come in for lab draw only.  Merla Riches, MD

## 2014-07-19 NOTE — Telephone Encounter (Signed)
Attempted to contact patient, no answer, pt's voicemail is not set up, unable to leave a message.  Will send certified letter.   Letter sent.

## 2014-07-25 ENCOUNTER — Other Ambulatory Visit: Payer: Self-pay | Admitting: Family Medicine

## 2014-07-25 ENCOUNTER — Telehealth: Payer: Self-pay | Admitting: Family Medicine

## 2014-07-25 ENCOUNTER — Encounter (HOSPITAL_COMMUNITY): Payer: Self-pay

## 2014-07-25 ENCOUNTER — Ambulatory Visit (HOSPITAL_COMMUNITY)
Admission: RE | Admit: 2014-07-25 | Discharge: 2014-07-25 | Disposition: A | Discharge status: Home / Self Care | Payer: Medicaid Other | Source: Ambulatory Visit | Attending: Family Medicine | Admitting: Family Medicine

## 2014-07-25 DIAGNOSIS — Z36 Encounter for antenatal screening of mother: Secondary | ICD-10-CM | POA: Diagnosis not present

## 2014-07-25 DIAGNOSIS — Z3682 Encounter for antenatal screening for nuchal translucency: Secondary | ICD-10-CM

## 2014-07-25 DIAGNOSIS — Z3A11 11 weeks gestation of pregnancy: Secondary | ICD-10-CM | POA: Diagnosis not present

## 2014-07-25 NOTE — Telephone Encounter (Signed)
Xanax is not noted on patient chart.   Last office visit 12/10/2013.  MD please advise.

## 2014-07-25 NOTE — Telephone Encounter (Signed)
She needs OV for these meds but in general can not be used safely in pregnancy and pt is pregnant per chart.

## 2014-07-25 NOTE — Telephone Encounter (Signed)
Pt is pregnant!  OK refill?

## 2014-07-25 NOTE — Telephone Encounter (Signed)
Pt is pregnant.  OK refill?

## 2014-07-25 NOTE — Telephone Encounter (Signed)
[facility]   Patient called pharmacy to get refill on xanax and gabapentin, but is going out of town tomorrow, would like to know if dr Buelah Manis can go ahead and call pharmacy for these refills   [phone]

## 2014-07-25 NOTE — Telephone Encounter (Signed)
Call placed to patient. No answer. No VM.  

## 2014-07-26 NOTE — Telephone Encounter (Signed)
Call placed to patient. No answer. No VM.  

## 2014-07-26 NOTE — Telephone Encounter (Signed)
Patient returned call and made aware.   States that she used gabapentin in last pregnancy. Advised to discuss with OB.

## 2014-07-26 NOTE — Telephone Encounter (Signed)
noted 

## 2014-07-31 ENCOUNTER — Other Ambulatory Visit (HOSPITAL_COMMUNITY): Payer: Self-pay | Admitting: Obstetrics and Gynecology

## 2014-08-01 ENCOUNTER — Encounter: Payer: Self-pay | Admitting: General Practice

## 2014-08-14 ENCOUNTER — Encounter: Payer: Self-pay | Admitting: Obstetrics & Gynecology

## 2014-08-14 ENCOUNTER — Ambulatory Visit (INDEPENDENT_AMBULATORY_CARE_PROVIDER_SITE_OTHER): Payer: Medicaid Other | Admitting: Obstetrics & Gynecology

## 2014-08-14 VITALS — BP 121/80 | HR 93 | Wt 190.0 lb

## 2014-08-14 DIAGNOSIS — E041 Nontoxic single thyroid nodule: Secondary | ICD-10-CM

## 2014-08-14 DIAGNOSIS — F419 Anxiety disorder, unspecified: Secondary | ICD-10-CM

## 2014-08-14 DIAGNOSIS — Z3401 Encounter for supervision of normal first pregnancy, first trimester: Secondary | ICD-10-CM

## 2014-08-14 MED ORDER — BUSPIRONE HCL 10 MG PO TABS: 10.0000 mg | ORAL_TABLET | Freq: Three times a day (TID) | ORAL | Status: DC | PRN

## 2014-08-14 MED ORDER — GABAPENTIN 300 MG PO CAPS: 300.0000 mg | ORAL_CAPSULE | Freq: Three times a day (TID) | ORAL | Status: DC | PRN

## 2014-08-14 NOTE — Addendum Note (Signed)
Addended by: Erik Obey on: 08/14/2014 03:27 PM   Modules accepted: Orders

## 2014-08-14 NOTE — Progress Notes (Addendum)
Normal first trimester screen, redated by [redacted]w[redacted]d ultrasound. AFP check next visit. Buspar and Gabapentin ordered for anxiety as per patient request. Thyroid scan and anatomy scan ordered/scheduled No other complaints or concerns.  Routine obstetric precautions reviewed.

## 2014-08-14 NOTE — Patient Instructions (Signed)
Return to clinic for any obstetric concerns or go to MAU for evaluation  

## 2014-08-17 ENCOUNTER — Other Ambulatory Visit: Payer: Medicaid Other

## 2014-09-11 ENCOUNTER — Ambulatory Visit (HOSPITAL_COMMUNITY)
Admission: RE | Admit: 2014-09-11 | Discharge: 2014-09-11 | Disposition: A | Discharge status: Home / Self Care | Payer: Medicaid Other | Source: Ambulatory Visit | Attending: Family Medicine | Admitting: Family Medicine

## 2014-09-11 DIAGNOSIS — Z3401 Encounter for supervision of normal first pregnancy, first trimester: Secondary | ICD-10-CM | POA: Insufficient documentation

## 2014-09-11 DIAGNOSIS — Z3689 Encounter for other specified antenatal screening: Secondary | ICD-10-CM | POA: Insufficient documentation

## 2014-09-11 DIAGNOSIS — Z3A18 18 weeks gestation of pregnancy: Secondary | ICD-10-CM | POA: Insufficient documentation

## 2014-09-12 ENCOUNTER — Encounter: Payer: Medicaid Other | Admitting: Obstetrics & Gynecology

## 2014-10-09 ENCOUNTER — Ambulatory Visit: Payer: Self-pay | Admitting: Family Medicine

## 2014-10-13 ENCOUNTER — Encounter: Payer: Self-pay | Admitting: Family Medicine

## 2014-10-13 ENCOUNTER — Ambulatory Visit (INDEPENDENT_AMBULATORY_CARE_PROVIDER_SITE_OTHER): Payer: Medicaid Other | Admitting: Family Medicine

## 2014-10-13 VITALS — BP 122/80 | HR 103 | Temp 98.3°F | Resp 18 | Ht 64.0 in | Wt 197.3 lb

## 2014-10-13 DIAGNOSIS — N911 Secondary amenorrhea: Secondary | ICD-10-CM | POA: Diagnosis not present

## 2014-10-13 DIAGNOSIS — F419 Anxiety disorder, unspecified: Secondary | ICD-10-CM

## 2014-10-13 MED ORDER — PRENATAL VITAMIN PLUS LOW IRON 27-1 MG PO TABS: 1.0000 | ORAL_TABLET | Freq: Every day | ORAL | Status: DC

## 2014-10-13 MED ORDER — GABAPENTIN 300 MG PO CAPS: 300.0000 mg | ORAL_CAPSULE | Freq: Three times a day (TID) | ORAL | Status: DC | PRN

## 2014-10-13 MED ORDER — BUSPIRONE HCL 10 MG PO TABS: 10.0000 mg | ORAL_TABLET | Freq: Two times a day (BID) | ORAL | Status: DC | PRN

## 2014-10-13 MED ORDER — SERTRALINE HCL 100 MG PO TABS: 100.0000 mg | ORAL_TABLET | Freq: Every day | ORAL | Status: DC

## 2014-10-13 NOTE — Progress Notes (Addendum)
Name: Alexandria [name]   MRN: [id number]    DOB: November 04, 1985   Date:10/13/2014       Progress Note  Subjective  Chief Complaint  Chief Complaint  Patient presents with  . Establish Care  . Referral    patient needs a referral to OB/GYN    HPI  Anxiety: Patient complains of anxiety disorder and panic attacks.  She has the following symptoms: difficulty concentrating, fatigue, feelings of losing control, irritable, paresthesias, psychomotor agitation, racing thoughts. Onset of symptoms was approximately several years ago, stable since that time. She denies current suicidal and homicidal ideation. Family history significant for anxiety and depression.Possible organic causes contributing are: endocrine/metabolic pregnancy. Risk factors: previous episode of depression. Previous treatment includes BuSpar and medication such as Gabapentin and SSRI throughout her previous pregnancy.  She complains of the following side effects from the treatment: none.  Pregnancy: Needs referral to transfer care from [facility] in [place] to [place]. She has been going to her prenatal appointments and has had her 1st trimester labs done. No ongoing thyroid issues during this pregnancy.   Patient Active Problem List   Diagnosis Date Noted  . Supervision of low-risk first pregnancy 07/17/2014  . Noncompliance 07/17/2014  . 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 Anxiety 10/06/2012  . Rh negative state in antepartum period 08/09/2012  . CIN III (cervical intraepithelial neoplasia grade III) with severe dysplasia 08/06/2012    History  Substance Use Topics  . Smoking status: Former Research scientist (life sciences)  . Smokeless tobacco: Never Used  . Alcohol Use: No     Current outpatient prescriptions:  .  busPIRone (BUSPAR) 10 MG tablet, Take 1 tablet (10 mg total) by mouth 3 (three) times daily as needed., Disp: 90 tablet, Rfl: 3 .  cetirizine (ZYRTEC) 10 MG tablet, Take  10 mg by mouth daily as needed for allergies., Disp: , Rfl:  .  gabapentin (NEURONTIN) 300 MG capsule, Take 1 capsule (300 mg total) by mouth 3 (three) times daily as needed., Disp: 90 capsule, Rfl: 5 .  Multiple Vitamins-Minerals (MULTIVITAMIN PO), Take 1 tablet by mouth every morning. , Disp: , Rfl:  .  sertraline (ZOLOFT) 100 MG tablet, TAKE 1 TABLET (100 MG TOTAL) BY MOUTH DAILY., Disp: 30 tablet, Rfl: 3 .  miconazole (MONISTAT 1 COMBO PACK) kit, Place 1 each vaginally once. (Patient not taking: Reported on 07/17/2014), Disp: 1 each, Rfl: 0  Past Surgical History  Procedure Laterality Date  . No past surgeries      Family History  Problem Relation Age of Onset  . Cancer Mother 30    colon cancer  . Cancer Maternal Aunt     Cervical Cancer  . Cancer Paternal Aunt     Colon cancer  . Asthma Brother     No Known Allergies   Review of Systems  Ten systems reviewed and is negative except as mentioned in HPI.    Objective  BP 122/80 mmHg  Pulse 103  Temp(Src) 98.3 F (36.8 C) (Oral)  Resp 18  Ht $R'5\' 4"'xB$  (1.626 m)  Wt 197 lb 4.8 oz (89.495 kg)  BMI 33.85 kg/m2  SpO2 96%  LMP 04/19/2014  Physical Exam  Constitutional: Patient appears well-developed and well-nourished. In no distress.  HEENT:  - Head: Normocephalic and atraumatic.  - Ears: Bilateral TMs gray, no erythema or effusion - Nose: Nasal mucosa moist - Mouth/Throat: Oropharynx is clear and moist. No tonsillar hypertrophy or erythema. No post  nasal drainage.  - Eyes: Conjunctivae clear, EOM movements normal. PERRLA. No scleral icterus.  Neck: Normal range of motion. Neck supple. No JVD present. No thyromegaly present.  Cardiovascular: Normal rate, regular rhythm and normal heart sounds.  No murmur heard.  Pulmonary/Chest: Effort normal and breath sounds normal. No respiratory distress. Abdomen: gravida Peripheral vascular: Bilateral LE no edema. Neurological: CN II-XII grossly intact with no focal deficits.  Alert and oriented to person, place, and time. Coordination, balance, strength, speech and gait are normal.  Skin: Skin is warm and dry. No rash noted. No erythema.  Psychiatric: Patient has a normal mood and affect. Behavior is normal in office today. Judgment and thought content normal in office today.   Recent Results (from the past 2160 hour(s))  Cytology - PAP     Status: None   Collection Time: 07/17/14 12:00 AM  Result Value Ref Range   CYTOLOGY - PAP PAP RESULT   POCT urinalysis dip (device)     Status: None   Collection Time: 07/17/14  2:49 PM  Result Value Ref Range   Glucose, UA NEGATIVE NEGATIVE mg/dL   Bilirubin Urine NEGATIVE NEGATIVE   Ketones, ur NEGATIVE NEGATIVE mg/dL   Specific Gravity, Urine 1.010 1.005 - 1.030   Hgb urine dipstick NEGATIVE NEGATIVE   pH 6.0 5.0 - 8.0   Protein, ur NEGATIVE NEGATIVE mg/dL   Urobilinogen, UA 0.2 0.0 - 1.0 mg/dL   Nitrite NEGATIVE NEGATIVE   Leukocytes, UA NEGATIVE NEGATIVE    Comment: Biochemical Testing Only. Please order routine urinalysis from main lab if confirmatory testing is needed.     Assessment & Plan 1. Anxiety I will continue to manage Luccia's anxiety with her routine regimen of medications as long as the benefits outweigh the harms to her or her baby. She has been counseled today that these medications are Category C and could potentially cause fetal harm despite her having no such experience with her previous pregnancies. Patient voices understanding the risks she is taking.  - busPIRone (BUSPAR) 10 MG tablet; Take 1 tablet (10 mg total) by mouth 2 (two) times daily as needed.  Dispense: 60 tablet; Refill: 2 - sertraline (ZOLOFT) 100 MG tablet; Take 1 tablet (100 mg total) by mouth daily.  Dispense: 30 tablet; Refill: 3 - gabapentin (NEURONTIN) 300 MG capsule; Take 1 capsule (300 mg total) by mouth 3 (three) times daily as needed.  Dispense: 90 capsule; Refill: 3  2. Amenorrhea, secondary due to Pregnancy Transfer  of care to new Obstetrician. PNV with iron ordered.  - Ambulatory referral to Obstetrics / Gynecology - Prenatal Vit-Fe Fumarate-FA (PRENATAL VITAMIN PLUS LOW IRON) 27-1 MG TABS; Take 1 tablet by mouth daily.  Dispense: 30 tablet; Refill: 3

## 2014-11-03 ENCOUNTER — Telehealth: Payer: Self-pay | Admitting: Family Medicine

## 2014-11-03 NOTE — Telephone Encounter (Signed)
[facility] called about patient to inform our office that we did a referral for her to go there and she was a no show.

## 2014-11-06 NOTE — Telephone Encounter (Signed)
Patient states she already rescheduled her appt for [facility].

## 2014-11-06 NOTE — Telephone Encounter (Signed)
Please contact patient and inform her that she has missed her first appointment with [facility] for her pregnancy and she needs to reschedule ASAP.

## 2014-12-14 LAB — OB RESULTS CONSOLE ANTIBODY SCREEN: Antibody Screen: POSITIVE

## 2014-12-14 LAB — OB RESULTS CONSOLE VARICELLA ZOSTER ANTIBODY, IGG: Varicella: IMMUNE

## 2014-12-14 LAB — OB RESULTS CONSOLE HIV ANTIBODY (ROUTINE TESTING): HIV: NONREACTIVE

## 2014-12-14 LAB — OB RESULTS CONSOLE RPR: RPR: NONREACTIVE

## 2014-12-27 ENCOUNTER — Encounter: Payer: Self-pay | Admitting: Family Medicine

## 2015-01-12 LAB — OB RESULTS CONSOLE GC/CHLAMYDIA
Chlamydia: NEGATIVE
Gonorrhea: NEGATIVE

## 2015-01-12 LAB — OB RESULTS CONSOLE GBS: STREP GROUP B AG: POSITIVE

## 2015-02-07 ENCOUNTER — Observation Stay
Admission: EM | Admit: 2015-02-07 | Discharge: 2015-02-07 | Disposition: A | Discharge status: Home / Self Care | Payer: Medicaid Other | Source: Home / Self Care | Admitting: Obstetrics and Gynecology

## 2015-02-07 ENCOUNTER — Inpatient Hospital Stay: Payer: Medicaid Other | Admitting: Anesthesiology

## 2015-02-07 ENCOUNTER — Inpatient Hospital Stay
Admission: EM | Admit: 2015-02-07 | Discharge: 2015-02-09 | DRG: 775 | Disposition: A | Discharge status: Home / Self Care | Payer: Medicaid Other | Attending: Obstetrics and Gynecology | Admitting: Obstetrics and Gynecology

## 2015-02-07 DIAGNOSIS — F329 Major depressive disorder, single episode, unspecified: Secondary | ICD-10-CM | POA: Diagnosis present

## 2015-02-07 DIAGNOSIS — F419 Anxiety disorder, unspecified: Secondary | ICD-10-CM | POA: Diagnosis present

## 2015-02-07 DIAGNOSIS — E049 Nontoxic goiter, unspecified: Secondary | ICD-10-CM | POA: Diagnosis present

## 2015-02-07 DIAGNOSIS — O99344 Other mental disorders complicating childbirth: Secondary | ICD-10-CM | POA: Diagnosis present

## 2015-02-07 DIAGNOSIS — Z86001 Personal history of in-situ neoplasm of cervix uteri: Secondary | ICD-10-CM

## 2015-02-07 DIAGNOSIS — O99824 Streptococcus B carrier state complicating childbirth: Secondary | ICD-10-CM | POA: Diagnosis present

## 2015-02-07 DIAGNOSIS — O480 Post-term pregnancy: Principal | ICD-10-CM | POA: Diagnosis present

## 2015-02-07 DIAGNOSIS — O479 False labor, unspecified: Secondary | ICD-10-CM | POA: Diagnosis present

## 2015-02-07 LAB — TYPE AND SCREEN
ABO/RH(D): A NEG
Antibody Screen: NEGATIVE

## 2015-02-07 LAB — CBC
HCT: 36.1 % (ref 35.0–47.0)
Hemoglobin: 12.3 g/dL (ref 12.0–16.0)
MCH: 28.6 pg (ref 26.0–34.0)
MCHC: 34.1 g/dL (ref 32.0–36.0)
MCV: 84.1 fL (ref 80.0–100.0)
PLATELETS: 195 10*3/uL (ref 150–440)
RBC: 4.29 MIL/uL (ref 3.80–5.20)
RDW: 13.1 % (ref 11.5–14.5)
WBC: 12.2 10*3/uL — AB (ref 3.6–11.0)

## 2015-02-07 MED ORDER — ACETAMINOPHEN 325 MG PO TABS: 650.0000 mg | ORAL_TABLET | ORAL | Status: DC | PRN

## 2015-02-07 MED ORDER — FENTANYL 2.5 MCG/ML W/ROPIVACAINE 0.2% IN NS 100 ML EPIDURAL INFUSION (ARMC-ANES)
EPIDURAL | Status: AC
  Administered 2015-02-07: 10 mL/h via EPIDURAL
  Filled 2015-02-07: qty 100

## 2015-02-07 MED ORDER — OXYTOCIN 40 UNITS IN LACTATED RINGERS INFUSION - SIMPLE MED
1.0000 m[IU]/min | INTRAVENOUS | Status: DC
  Administered 2015-02-07: 1 m[IU]/min via INTRAVENOUS

## 2015-02-07 MED ORDER — TERBUTALINE SULFATE 1 MG/ML IJ SOLN: 0.2500 mg | Freq: Once | INTRAMUSCULAR | Status: DC | PRN

## 2015-02-07 MED ORDER — SODIUM CHLORIDE 0.9 % IV SOLN
1.0000 g | INTRAVENOUS | Status: DC
  Administered 2015-02-07: 1 g via INTRAVENOUS
  Filled 2015-02-07: qty 1000

## 2015-02-07 MED ORDER — ONDANSETRON HCL 4 MG/2ML IJ SOLN: 4.0000 mg | Freq: Four times a day (QID) | INTRAMUSCULAR | Status: DC | PRN

## 2015-02-07 MED ORDER — LACTATED RINGERS IV SOLN
500.0000 mL | INTRAVENOUS | Status: DC | PRN
  Administered 2015-02-07: 1000 mL via INTRAVENOUS

## 2015-02-07 MED ORDER — OXYTOCIN BOLUS FROM INFUSION: 500.0000 mL | INTRAVENOUS | Status: DC

## 2015-02-07 MED ORDER — BUTORPHANOL TARTRATE 1 MG/ML IJ SOLN
1.0000 mg | INTRAMUSCULAR | Status: DC | PRN
  Administered 2015-02-07: 2 mg via INTRAVENOUS
  Filled 2015-02-07: qty 2

## 2015-02-07 MED ORDER — LACTATED RINGERS IV SOLN
INTRAVENOUS | Status: DC
  Administered 2015-02-07: 17:00:00 via INTRAVENOUS

## 2015-02-07 MED ORDER — AMPICILLIN SODIUM 2 G IJ SOLR
2.0000 g | Freq: Once | INTRAMUSCULAR | Status: AC
  Administered 2015-02-07: 2 g via INTRAVENOUS
  Filled 2015-02-07: qty 2000

## 2015-02-07 MED ORDER — OXYTOCIN 40 UNITS IN LACTATED RINGERS INFUSION - SIMPLE MED
62.5000 mL/h | INTRAVENOUS | Status: DC
  Administered 2015-02-07: 62.5 mL/h via INTRAVENOUS
  Filled 2015-02-07: qty 1000

## 2015-02-07 MED ORDER — LIDOCAINE HCL (PF) 1 % IJ SOLN: 30.0000 mL | INTRAMUSCULAR | Status: DC | PRN

## 2015-02-07 MED ORDER — CITRIC ACID-SODIUM CITRATE 334-500 MG/5ML PO SOLN: 30.0000 mL | ORAL | Status: DC | PRN

## 2015-02-07 NOTE — Anesthesia Preprocedure Evaluation (Signed)
Anesthesia Evaluation  Patient identified by MRN, date of birth, ID band Patient awake    Reviewed: Allergy & Precautions, NPO status , Patient's Chart, lab work & pertinent test results  History of Anesthesia Complications Negative for: history of anesthetic complications  Airway Mallampati: II       Dental  (+) Teeth Intact   Pulmonary neg pulmonary ROS, former smoker,           Cardiovascular negative cardio ROS       Neuro/Psych Anxiety Depression    GI/Hepatic negative GI ROS, Neg liver ROS,   Endo/Other  negative endocrine ROS  Renal/GU negative Renal ROS     Musculoskeletal   Abdominal   Peds  Hematology negative hematology ROS (+)   Anesthesia Other Findings   Reproductive/Obstetrics                             Anesthesia Physical Anesthesia Plan  ASA: II  Anesthesia Plan: Epidural   Post-op Pain Management:    Induction:   Airway Management Planned:   Additional Equipment:   Intra-op Plan:   Post-operative Plan:   Informed Consent: I have reviewed the patients History and Physical, chart, labs and discussed the procedure including the risks, benefits and alternatives for the proposed anesthesia with the patient or authorized representative who has indicated his/her understanding and acceptance.     Plan Discussed with:   Anesthesia Plan Comments:         Anesthesia Quick Evaluation

## 2015-02-07 NOTE — Discharge Summary (Signed)
Obstetric Discharge Summary Reason for Admission: onset of labor Prenatal Procedures: NST Intrapartum Procedures: spontaneous vaginal delivery Postpartum Procedures: Rhogam Complications-Operative and Postpartum: none HEMOGLOBIN  Date Value Ref Range Status  02/08/2015 12.0 12.0 - 16.0 g/dL Final   HCT  Date Value Ref Range Status  02/08/2015 35.9 35.0 - 47.0 % Final    Physical Exam:  General: alert and no distress Lochia: appropriate Uterine Fundus: firm DVT Evaluation: No evidence of DVT seen on physical exam.  Discharge Diagnoses: Term Pregnancy-delivered  Discharge Information: Date: 02/09/2015 Activity: pelvic rest Diet: routine   Medication List    STOP taking these medications        gabapentin 300 MG capsule  Commonly known as:  NEURONTIN     MULTIVITAMIN PO      TAKE these medications        benzocaine-Menthol 20-0.5 % Aero  Commonly known as:  DERMOPLAST  Apply 1 application topically as needed for irritation (perineal discomfort).     busPIRone 5 MG tablet  Commonly known as:  BUSPAR  Take 1 tablet (5 mg total) by mouth 2 (two) times daily as needed (anxiety).     cetirizine 10 MG tablet  Commonly known as:  ZYRTEC  Take 10 mg by mouth daily as needed for allergies.     ibuprofen 600 MG tablet  Commonly known as:  ADVIL,MOTRIN  Take 1 tablet (600 mg total) by mouth every 6 (six) hours as needed.     lanolin Oint  Apply 1 application topically as needed (for breast care).     norethindrone 0.35 MG tablet  Commonly known as:  CAMILA  Take 1 tablet (0.35 mg total) by mouth daily.     oxyCODONE-acetaminophen 5-325 MG tablet  Commonly known as:  PERCOCET/ROXICET  Take 1 tablet by mouth every 6 (six) hours as needed for moderate pain or severe pain.     PRENATAL VITAMIN PLUS LOW IRON 27-1 MG Tabs  Take 1 tablet by mouth daily.     sertraline 25 MG tablet  Commonly known as:  ZOLOFT  Take 1 tablet (25 mg total) by mouth daily.         Condition: stable Discharge to: home Follow-up Information    Follow up with Lorn Butcher, CNM. Schedule an appointment as soon as possible for a visit in 2 weeks.   Specialty:  Certified Nurse Midwife   Why:  for evaluation for PPD   Contact information:   [place] [address] [phone]       Newborn Data: [place]/ Weight 7#14.6oz/ Breast Home with mother.  Yaroslav Gombos 02/09/2015, 9:39 AM

## 2015-02-07 NOTE — OB Triage Note (Signed)
Pt arrived to unit for triage from increased contractions, membranes intact.  Pt located in LDR 1. MD aware

## 2015-02-07 NOTE — H&P (Signed)
Date of Initial H&P: 02/07/15  History reviewed, patient examined, now 3/50/-3 will start antibiotics discussed augmentation if necessary

## 2015-02-07 NOTE — H&P (Signed)
Obstetrics Admission History & Physical  02/07/2015 - 6:35 AM Primary OBGYN: [facility]  Chief Complaint: labor evaluation  History of Present Illness  29 y.o. [id number] @ [redacted]w[redacted]d (Dating: 11wk u/s, [place] 10/12), with the above CC. Pregnancy complicated by: Rh negative, GBS positive, anxiety/depression, thyroid goiter, h/o CIN 3  Ms. Alexandria [name] states that having still regular UCs (unsure of how frequent). No SROM s/s, VB  Review of Systems: her 12 point review of systems is negative or as noted in the History of Present Illness.  PMHx:  Past Medical History  Diagnosis Date  . Depression   . Anxiety   . High risk HPV infection     followed by GYN  . CIN III (cervical intraepithelial neoplasia III)   . Enlarged thyroid    PSHx:  Past Surgical History  Procedure Laterality Date  . No past surgeries     Medications: Gabapentin 300 qday, sertraline 25 qday, buspar 5 qday  Allergies: has No Known Allergies. OBHx:  OB History  Gravida Para Term Preterm AB SAB TAB Ectopic Multiple Living  5 3 3  1  1   3     # Outcome Date GA Lbr Len/2nd Weight Sex Delivery Anes PTL Lv  5 Current           4 Term 03/23/13 [redacted]w[redacted]d 00:25 / 00:13 8 lb 5.7 oz (3.79 kg) [address] EPI  Y  3 Term 09/17/08 [redacted]w[redacted]d  7 lb 12 oz (3.515 kg) M Vag-Spont EPI    2 Term 08/27/06 [redacted]w[redacted]d  7 lb 12 oz (3.515 kg) M Vag-Spont EPI    1 TAB               GYNHx:  History of abnormal pap smears: Yes.        FHx:  Family History  Problem Relation Age of Onset  . Cancer Mother 90    colon cancer  . Cancer Maternal Aunt     Cervical Cancer  . Cancer Paternal Aunt     Colon cancer  . Asthma Brother    Soc Hx:  Social History   Social History  . Marital Status: Legally Separated    Spouse Name: N/A  . Number of Children: N/A  . Years of Education: N/A   Occupational History  . Not on file.   Social History Main Topics  . Smoking status: Former Research scientist (life sciences)  . Smokeless tobacco: Never Used  . Alcohol Use: No   . Drug Use: No  . Sexual Activity: Yes    Birth Control/ Protection: Pill   Other Topics Concern  . Not on file   Social History Narrative    Objective    Current Vital Signs 24h Vital Sign Ranges  T 98.7 F (37.1 C) Temp  Avg: 98.7 F (37.1 C)  Min: 98.7 F (37.1 C)  Max: 98.7 F (37.1 C)  BP 133/80 mmHg BP  Min: 133/80  Max: 133/80  HR 94 Pulse  Avg: 94  Min: 94  Max: 94  RR  16 No Data Recorded  SaO2    98/RA No Data Recorded       24 Hour I/O Current Shift I/O  Time Ins Outs       EFM: 115 baseline, +accels, rare early decel and some maternal artificat, mod var  Toco: q5-34m  General: Well nourished, well developed female in no acute distress. Patient in no distress with UCs Skin:  Warm and dry.  Cardiovascular: Regular rate and  rhythm. Respiratory:  Clear to auscultation bilateral. Normal respiratory effort Abdomen: gravid, nttp Neuro/Psych:  Normal mood and affect.   SVE: 1.5/50/-4/posterior/intermediate. Leopolds/EFW: 3700gm  Labs  none  Radiology none  Perinatal info  A neg/ Rubella  Immune / Varicella Immune/RPR neg/HIV Negative/HepB Surf Ag Negative/TDaP: yes / Flu shot: needed/pap unknown most recently/  Assessment & Plan   29 y.o. [id number] @ [redacted]w[redacted]d with signs and symptoms, with B-H contractions *IUP: rNST *EOL: patient was originally closed/50 at 0315 per RN but with difficult exam b/c it is very posterior and then 74min recheck was 1.5/50 by RN. I rechecked in 1hr and 1.5/50/-4 with likely earlier exam was probably 1.5 all along but just difficult exam. Patient requests membrane sweep so this was done and cx now likely 2cm and had great fetal scalp stim during sweep. Pt lives 76m away and told to let us know if UCs become more intense. Also pt told that can cancel appt for today. She was set up for 10/17 2000 IOL (other dates were full) for cx ripening and then IOL. Pt amenable to plan  *GBS: pos; not in active labor *Psych: no current needs;  continue home medications *Goiter: ft4 at 28wk labs was slightly low. Pt wasn't put on medication. Can recheck when patient is in labor with admit labs *Rh negative: rhogam needed PP PRN *Analgesia: no needs *Dispo: d/c to home  [place], [place] Pager [phone]

## 2015-02-07 NOTE — Discharge Instructions (Signed)
Call provider or return to [facility]place with: ° °1. Strong regular contractions every 5 minutes. °2. Leaking of fluid from your vagina °3. Vaginal bleeding: Bright red or heavy like a period °4. Decreased Fetal movement ° °

## 2015-02-07 NOTE — Discharge Summary (Signed)
[facility] Triage Discharge Summary   29 y.o. [id number] @ [redacted]w[redacted]d presenting for EOL.  Fetal status reassuring. EFM showed rNST with q5-104m UCs. Triage course: Cx unchanged at 1.5/50/-4 over 3-4 hours and patient appears comfortable. Membranes swept at her request and cx now likely 2cm. Set up for 10/17 2000 cx ripening IOL due to later dates full  Labs pending: none RTC pt told okay to cancel appt for today Disposition: Home   Aletha Halim, Brooke Bonito MD [place]  Pager: [phone]

## 2015-02-07 NOTE — OB Triage Note (Signed)
Pt presents to L&D with c/o contractions since 2100, q 5 min. Denies vaginal bleeding or leaking fluid. Reports good fetal movement.

## 2015-02-07 NOTE — Anesthesia Procedure Notes (Signed)
Epidural Patient location during procedure: OB Start time: 02/07/2015 9:05 PM End time: 02/07/2015 9:25 PM  Staffing Performed by: anesthesiologist   Preanesthetic Checklist Completed: patient identified, site marked, surgical consent, pre-op evaluation, timeout performed, IV checked, risks and benefits discussed and monitors and equipment checked  Epidural Patient position: sitting Prep: Betadine Patient monitoring: heart rate, continuous pulse ox and blood pressure Approach: midline Location: L4-L5 Injection technique: LOR saline  Needle:  Needle type: Tuohy  Needle gauge: 18 G Needle length: 9 cm and 9 Needle insertion depth: 8 cm Catheter type: closed end flexible Catheter size: 20 Guage Catheter at skin depth: 11 cm Test dose: negative and 1.5% lidocaine with Epi 1:200 K  Assessment Events: blood not aspirated, injection not painful, no injection resistance, negative IV test and no paresthesia  Additional Notes   Patient tolerated the insertion well without complications.Reason for block:procedure for pain

## 2015-02-07 NOTE — OB Triage Note (Signed)
Pt has made some cervical change after ambulation, and UC pattern more regular Dr Ilda Basset aware and plan to recheck at 0600 after further ambulation.

## 2015-02-07 NOTE — Progress Notes (Signed)
Subjective:  Doing well, increasing discomfort with contractions  Objective:   Filed Vitals:   02/07/15 1601 02/07/15 1736 02/07/15 1846 02/07/15 1921  BP: 120/78 121/74 135/84   Pulse: 105 101 98   Temp: 98.3 F (36.8 C)   97.3 F (36.3 C)  TempSrc: Oral   Oral  Resp:    16  Height: 5\' 4"  (1.626 m)     Weight: 100.245 kg (221 lb)       General: NAD Abdomen: gravid, soft Cervical Exam:  Dilation: 3 Effacement (%): 50 Station: -3 Presentation: Vertex Exam by:: AMS  FHT: 120, moderate, +accels, no decels Toco: q9min  Results for orders placed or performed during the hospital encounter of 02/07/15 (from the past 24 hour(s))  CBC     Status: Abnormal   Collection Time: 02/07/15  5:13 PM  Result Value Ref Range   WBC 12.2 (H) 3.6 - 11.0 K/uL   RBC 4.29 3.80 - 5.20 MIL/uL   Hemoglobin 12.3 12.0 - 16.0 g/dL   HCT 36.1 35.0 - 47.0 %   MCV 84.1 80.0 - 100.0 fL   MCH 28.6 26.0 - 34.0 pg   MCHC 34.1 32.0 - 36.0 g/dL   RDW 13.1 11.5 - 14.5 %   Platelets 195 150 - 440 K/uL  Type and screen     Status: None (Preliminary result)   Collection Time: 02/07/15  6:25 PM  Result Value Ref Range   ABO/RH(D) PENDING    Antibody Screen PENDING    Sample Expiration 02/10/2015     Assessment:   29 y.o. [id number] [redacted]w[redacted]d term labor  Plan:   1) Labor - continue pitocin augmentation, AROM after second dose ampicillin at 2130  2) Fetus - cat I tracing

## 2015-02-08 ENCOUNTER — Other Ambulatory Visit: Payer: Self-pay | Admitting: Obstetrics and Gynecology

## 2015-02-08 LAB — CBC
HCT: 35.9 % (ref 35.0–47.0)
Hemoglobin: 12 g/dL (ref 12.0–16.0)
MCH: 28.7 pg (ref 26.0–34.0)
MCHC: 33.4 g/dL (ref 32.0–36.0)
MCV: 86.1 fL (ref 80.0–100.0)
PLATELETS: 173 10*3/uL (ref 150–440)
RBC: 4.17 MIL/uL (ref 3.80–5.20)
RDW: 13 % (ref 11.5–14.5)
WBC: 14.8 10*3/uL — AB (ref 3.6–11.0)

## 2015-02-08 LAB — RPR: RPR Ser Ql: NONREACTIVE

## 2015-02-08 LAB — FETAL SCREEN: FETAL SCREEN: NEGATIVE

## 2015-02-08 LAB — ABO/RH: ABO/RH(D): A NEG

## 2015-02-08 MED ORDER — SERTRALINE HCL 25 MG PO TABS
25.0000 mg | ORAL_TABLET | Freq: Every day | ORAL | Status: DC
  Administered 2015-02-08: 25 mg via ORAL

## 2015-02-08 MED ORDER — OXYCODONE-ACETAMINOPHEN 5-325 MG PO TABS
1.0000 | ORAL_TABLET | ORAL | Status: DC | PRN
  Administered 2015-02-08 (×5): 1 via ORAL
  Filled 2015-02-08 (×5): qty 1

## 2015-02-08 MED ORDER — ONDANSETRON HCL 4 MG/2ML IJ SOLN: 4.0000 mg | INTRAMUSCULAR | Status: DC | PRN

## 2015-02-08 MED ORDER — PRENATAL MULTIVITAMIN CH
1.0000 | ORAL_TABLET | Freq: Every day | ORAL | Status: DC
  Administered 2015-02-08: 1 via ORAL
  Filled 2015-02-08 (×2): qty 1

## 2015-02-08 MED ORDER — WITCH HAZEL-GLYCERIN EX PADS: 1.0000 "application " | MEDICATED_PAD | CUTANEOUS | Status: DC | PRN

## 2015-02-08 MED ORDER — BUSPIRONE HCL 5 MG PO TABS
5.0000 mg | ORAL_TABLET | Freq: Two times a day (BID) | ORAL | Status: DC | PRN
  Filled 2015-02-08: qty 1

## 2015-02-08 MED ORDER — DIBUCAINE 1 % RE OINT: 1.0000 "application " | TOPICAL_OINTMENT | RECTAL | Status: DC | PRN

## 2015-02-08 MED ORDER — LANOLIN HYDROUS EX OINT: TOPICAL_OINTMENT | CUTANEOUS | Status: DC | PRN

## 2015-02-08 MED ORDER — RHO D IMMUNE GLOBULIN 1500 UNIT/2ML IJ SOSY
300.0000 ug | PREFILLED_SYRINGE | Freq: Once | INTRAMUSCULAR | Status: AC
  Administered 2015-02-08: 300 ug via INTRAVENOUS
  Filled 2015-02-08: qty 2

## 2015-02-08 MED ORDER — OXYCODONE-ACETAMINOPHEN 5-325 MG PO TABS
2.0000 | ORAL_TABLET | ORAL | Status: DC | PRN
  Administered 2015-02-09 (×3): 2 via ORAL
  Filled 2015-02-08 (×3): qty 2

## 2015-02-08 MED ORDER — FENTANYL 2.5 MCG/ML W/ROPIVACAINE 0.2% IN NS 100 ML EPIDURAL INFUSION (ARMC-ANES): 10.0000 mL/h | EPIDURAL | Status: DC

## 2015-02-08 MED ORDER — ONDANSETRON HCL 4 MG PO TABS: 4.0000 mg | ORAL_TABLET | ORAL | Status: DC | PRN

## 2015-02-08 MED ORDER — PHENYLEPHRINE 40 MCG/ML (10ML) SYRINGE FOR IV PUSH (FOR BLOOD PRESSURE SUPPORT)
80.0000 ug | PREFILLED_SYRINGE | INTRAVENOUS | Status: DC | PRN
  Filled 2015-02-08: qty 2

## 2015-02-08 MED ORDER — ACETAMINOPHEN 325 MG PO TABS: 650.0000 mg | ORAL_TABLET | ORAL | Status: DC | PRN

## 2015-02-08 MED ORDER — SIMETHICONE 80 MG PO CHEW: 80.0000 mg | CHEWABLE_TABLET | ORAL | Status: DC | PRN

## 2015-02-08 MED ORDER — DIPHENHYDRAMINE HCL 50 MG/ML IJ SOLN: 12.5000 mg | INTRAMUSCULAR | Status: DC | PRN

## 2015-02-08 MED ORDER — EPHEDRINE 5 MG/ML INJ
10.0000 mg | INTRAVENOUS | Status: DC | PRN
  Filled 2015-02-08: qty 2

## 2015-02-08 MED ORDER — SENNOSIDES-DOCUSATE SODIUM 8.6-50 MG PO TABS
2.0000 | ORAL_TABLET | ORAL | Status: DC
  Administered 2015-02-08 (×2): 2 via ORAL
  Filled 2015-02-08 (×4): qty 2

## 2015-02-08 MED ORDER — BENZOCAINE-MENTHOL 20-0.5 % EX AERO: 1.0000 "application " | INHALATION_SPRAY | CUTANEOUS | Status: DC | PRN

## 2015-02-08 MED ORDER — DIPHENHYDRAMINE HCL 25 MG PO CAPS: 25.0000 mg | ORAL_CAPSULE | Freq: Four times a day (QID) | ORAL | Status: DC | PRN

## 2015-02-08 MED ORDER — IBUPROFEN 600 MG PO TABS
600.0000 mg | ORAL_TABLET | Freq: Four times a day (QID) | ORAL | Status: DC
  Administered 2015-02-08 – 2015-02-09 (×6): 600 mg via ORAL
  Filled 2015-02-08 (×6): qty 1

## 2015-02-08 NOTE — Anesthesia Postprocedure Evaluation (Signed)
  Anesthesia Post-op Note  Patient: Alexandria [name]  Procedure(s) Performed: * No procedures listed *  Anesthesia type:Epidural  Patient location: PACU  Post pain: Pain level controlled  Post assessment: Post-op Vital signs reviewed, Patient's Cardiovascular Status Stable, Respiratory Function Stable, Patent Airway and No signs of Nausea or vomiting  Post vital signs: Reviewed and stable  Last Vitals:  Filed Vitals:   02/08/15 0611  BP: 103/55  Pulse: 78  Temp: 36.7 C  Resp: 18    Level of consciousness: awake, alert  and patient cooperative  Complications: No apparent anesthesia complications

## 2015-02-08 NOTE — Lactation Note (Signed)
This note was copied from the chart of [place]. Lactation Consultation Note  Patient Name: Alexandria [name] [id number] Date: 02/08/2015 Reason for consult: Follow-up assessment  Basic breast feeding reviewed. Mother had no further questions.   Maternal Data Has patient been taught Hand Expression?: Yes Does the patient have breastfeeding experience prior to this delivery?: Yes  Feeding Length of feed: 20 min  LATCH Score/Interventions Latch: Grasps breast easily, tongue down, lips flanged, rhythmical sucking.  Audible Swallowing: Spontaneous and intermittent  Type of Nipple: Everted at rest and after stimulation  Comfort (Breast/Nipple): Soft / non-tender     Hold (Positioning): No assistance needed to correctly position infant at breast.  LATCH Score: 10  Lactation Tools Discussed/Used     Consult Status      Daryel November 02/08/2015, 2:30 PM

## 2015-02-08 NOTE — Progress Notes (Signed)
Post Partum Day 1 Subjective: C/O some back pain and cramping . Voiding without problem. Tolerating regular diet Objective: BP 116/71. afebrile  Physical Exam:  General: alert, cooperative and no distress Lochia: appropriate Uterine Fundus: firm at U/ML/NT DVT Evaluation: No evidence of DVT seen on physical exam.   Recent Labs  02/07/15 1713 02/08/15 0627  HGB 12.3 12.0  HCT 36.1 35.9  WBC 12.2* 14.8*  PLT 195 173   Mother A NEG and Baby O POS Assessment/Plan: PPD #1-stable Rhogam indicated PO analgesics Resume antepartum meds for anxiety and depression   LOS: 1 day   Aryssa Rosamond 02/08/2015, 10:20 AM

## 2015-02-09 LAB — T4, FREE: FREE T4: 0.59 ng/dL — AB (ref 0.61–1.12)

## 2015-02-09 LAB — RHOGAM INJECTION: UNIT DIVISION: 0

## 2015-02-09 LAB — TSH: TSH: 4.463 u[IU]/mL (ref 0.350–4.500)

## 2015-02-09 MED ORDER — OXYCODONE-ACETAMINOPHEN 5-325 MG PO TABS: 1.0000 | ORAL_TABLET | Freq: Four times a day (QID) | ORAL | Status: DC | PRN

## 2015-02-09 MED ORDER — BENZOCAINE-MENTHOL 20-0.5 % EX AERO: 1.0000 "application " | INHALATION_SPRAY | CUTANEOUS | Status: DC | PRN

## 2015-02-09 MED ORDER — BUSPIRONE HCL 5 MG PO TABS: 5.0000 mg | ORAL_TABLET | Freq: Two times a day (BID) | ORAL | Status: DC | PRN

## 2015-02-09 MED ORDER — IBUPROFEN 600 MG PO TABS: 600.0000 mg | ORAL_TABLET | Freq: Four times a day (QID) | ORAL | Status: DC | PRN

## 2015-02-09 MED ORDER — SERTRALINE HCL 25 MG PO TABS: 25.0000 mg | ORAL_TABLET | Freq: Every day | ORAL | Status: DC

## 2015-02-09 MED ORDER — NORETHINDRONE 0.35 MG PO TABS: 1.0000 | ORAL_TABLET | Freq: Every day | ORAL | Status: DC

## 2015-02-09 MED ORDER — LANOLIN HYDROUS EX OINT: 1.0000 "application " | TOPICAL_OINTMENT | CUTANEOUS | Status: DC | PRN

## 2015-02-09 NOTE — Discharge Instructions (Signed)
Vaginal Delivery, Care After Refer to this sheet in the next few weeks. These discharge instructions provide you with information on caring for yourself after delivery. Your caregiver may also give you specific instructions. Your treatment has been planned according to the most current medical practices available, but problems sometimes occur. Call your caregiver if you have any problems or questions after you go home. HOME CARE INSTRUCTIONS 1. Take over-the-counter or prescription medicines only as directed by your caregiver or pharmacist. 2. Do not drink alcohol, especially if you are breastfeeding or taking medicine to relieve pain. 3. Do not smoke tobacco. 4. Continue to use good perineal care. Good perineal care includes: 1. Wiping your perineum from back to front 2. Keeping your perineum clean. 3. You can do sitz baths twice a day, to help keep this area clean 5. Do not use tampons, douche or have sex until your caregiver says it is okay. 6. Shower only and avoid sitting in submerged water, aside from sitz baths 7. Wear a well-fitting bra that provides breast support. 8. Eat healthy foods. 9. Drink enough fluids to keep your urine clear or pale yellow. 10. Eat high-fiber foods such as whole grain cereals and breads, brown rice, beans, and fresh fruits and vegetables every day. These foods may help prevent or relieve constipation. 11. Avoid constipation with high fiber foods or medications, such as miralax or metamucil 12. Follow your caregiver's recommendations regarding resumption of activities such as climbing stairs, driving, lifting, exercising, or traveling. 13. Talk to your caregiver about resuming sexual activities. Resumption of sexual activities is dependent upon your risk of infection, your rate of healing, and your comfort and desire to resume sexual activity. 14. Try to have someone help you with your household activities and your newborn for at least a few days after you leave  the hospital. 15. Rest as much as possible. Try to rest or take a nap when your newborn is sleeping. 16. Increase your activities gradually. 17. Keep all of your scheduled postpartum appointments. It is very important to keep your scheduled follow-up appointments. At these appointments, your caregiver will be checking to make sure that you are healing physically and emotionally. SEEK MEDICAL CARE IF:   You are passing large clots from your vagina. Save any clots to show your caregiver.  You have a foul smelling discharge from your vagina.  You have trouble urinating.  You are urinating frequently.  You have pain when you urinate.  You have a change in your bowel movements.  You have increasing redness, pain, or swelling near your vaginal incision (episiotomy) or vaginal tear.  You have pus draining from your episiotomy or vaginal tear.  Your episiotomy or vaginal tear is separating.  You have painful, hard, or reddened breasts.  You have a severe headache.  You have blurred vision or see spots.  You feel sad or depressed.  You have thoughts of hurting yourself or your newborn.  You have questions about your care, the care of your newborn, or medicines.  You are dizzy or light-headed.  You have a rash.  You have nausea or vomiting.  You were breastfeeding and have not had a menstrual period within 12 weeks after you stopped breastfeeding.  You are not breastfeeding and have not had a menstrual period by the 12th week after delivery.  You have a fever. SEEK IMMEDIATE MEDICAL CARE IF:   You have persistent pain.  You have chest pain.  You have shortness of breath.    You faint.  You have leg pain.  You have stomach pain.  Your vaginal bleeding saturates two or more sanitary pads in 1 hour. MAKE SURE YOU:   Understand these instructions.  Will watch your condition.  Will get help right away if you are not doing well or get worse. Document Released:  04/11/2000 Document Revised: 08/29/2013 Document Reviewed: 12/10/2011 ExitCare Patient Information 2015 [employer]. This information is not intended to replace advice given to you by your health care provider. Make sure you discuss any questions you have with your health care provider.  Sitz Bath A sitz bath is a warm water bath taken in the sitting position. The water covers only the hips and butt (buttocks). We recommend using one that fits in the toilet, to help with ease of use and cleanliness. It may be used for either healing or cleaning purposes. Sitz baths are also used to relieve pain, itching, or muscle tightening (spasms). The water may contain medicine. Moist heat will help you heal and relax.  HOME CARE  Take 3 to 4 sitz baths a day. 18. Fill the bathtub half-full with warm water. 19. Sit in the water and open the drain a little. 20. Turn on the warm water to keep the tub half-full. Keep the water running constantly. 21. Soak in the water for 15 to 20 minutes. 22. After the sitz bath, pat the affected area dry. GET HELP RIGHT AWAY IF: You get worse instead of better. Stop the sitz baths if you get worse. MAKE SURE YOU:  Understand these instructions.  Will watch your condition.  Will get help right away if you are not doing well or get worse. Document Released: 05/22/2004 Document Revised: 01/07/2012 Document Reviewed: 08/12/2010 ExitCare Patient Information 2015 [employer]. This information is not intended to replace advice given to you by your health care provider. Make sure you discuss any questions you have with your health care provider.    

## 2015-02-09 NOTE — Progress Notes (Signed)
D/C order from MD.  Reviewed d/c instructions and prescriptions with patient and answered any questions.  Patient d/c home with infant via wheelchair by nursing/auxillary. 

## 2015-03-20 ENCOUNTER — Other Ambulatory Visit: Payer: Self-pay | Admitting: Family Medicine

## 2015-03-27 ENCOUNTER — Other Ambulatory Visit: Payer: Self-pay

## 2015-03-27 MED ORDER — SERTRALINE HCL 100 MG PO TABS: 100.0000 mg | ORAL_TABLET | Freq: Every day | ORAL | Status: DC

## 2015-03-27 MED ORDER — GABAPENTIN 300 MG PO CAPS: 300.0000 mg | ORAL_CAPSULE | Freq: Three times a day (TID) | ORAL | Status: DC

## 2015-03-27 NOTE — Telephone Encounter (Signed)
Got a fax from [facility] on [address]. [name] a refill on this patient's Gabapentin 300mg  & Sertraline 100mg .  Refill request was sent to Dr. Bobetta Lime for approval and submission.  Last seen on 10/13/2014

## 2015-06-21 IMAGING — CR DG KNEE COMPLETE 4+V*L*
4 series · 4 of 4 positions shown · non-contrast
Comparison: None.

CLINICAL DATA: Slipped on a wet neck this evening, striking the
left knee. Left anterior knee pain and bruising.

EXAM:
LEFT KNEE - COMPLETE 4+ VIEW

[x knee ap left]
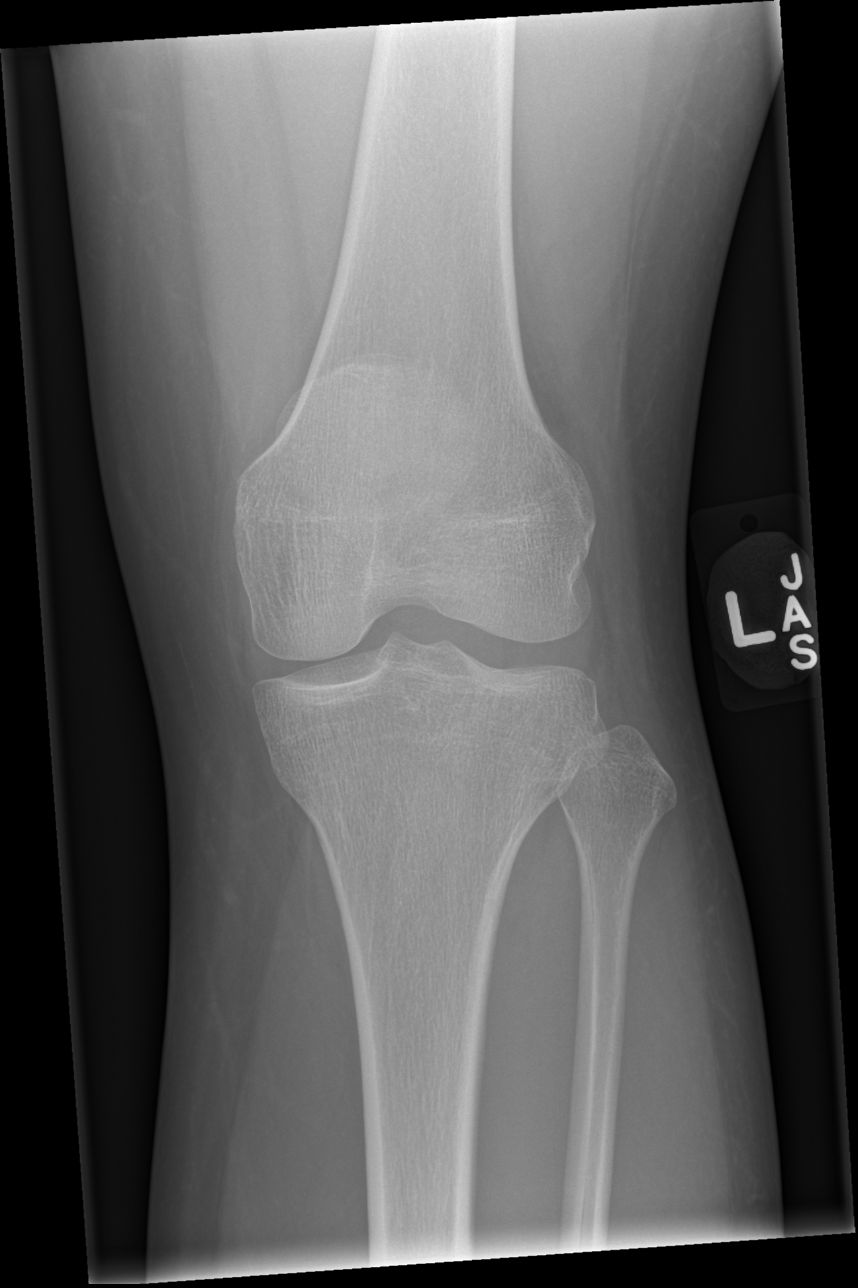

[x knee obl left (1 of 2)]
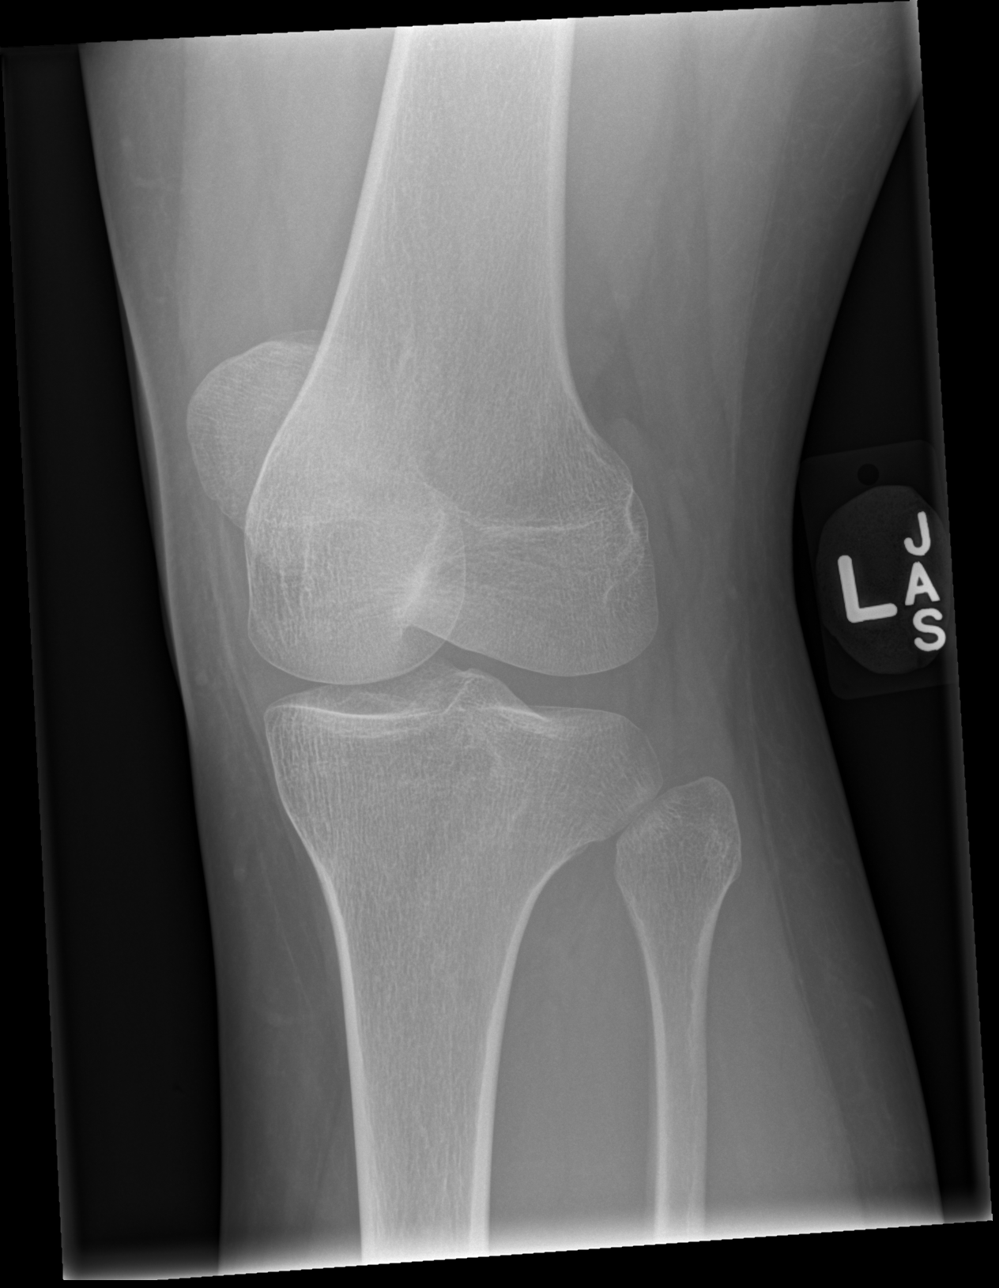

[x knee obl left (2 of 2)]
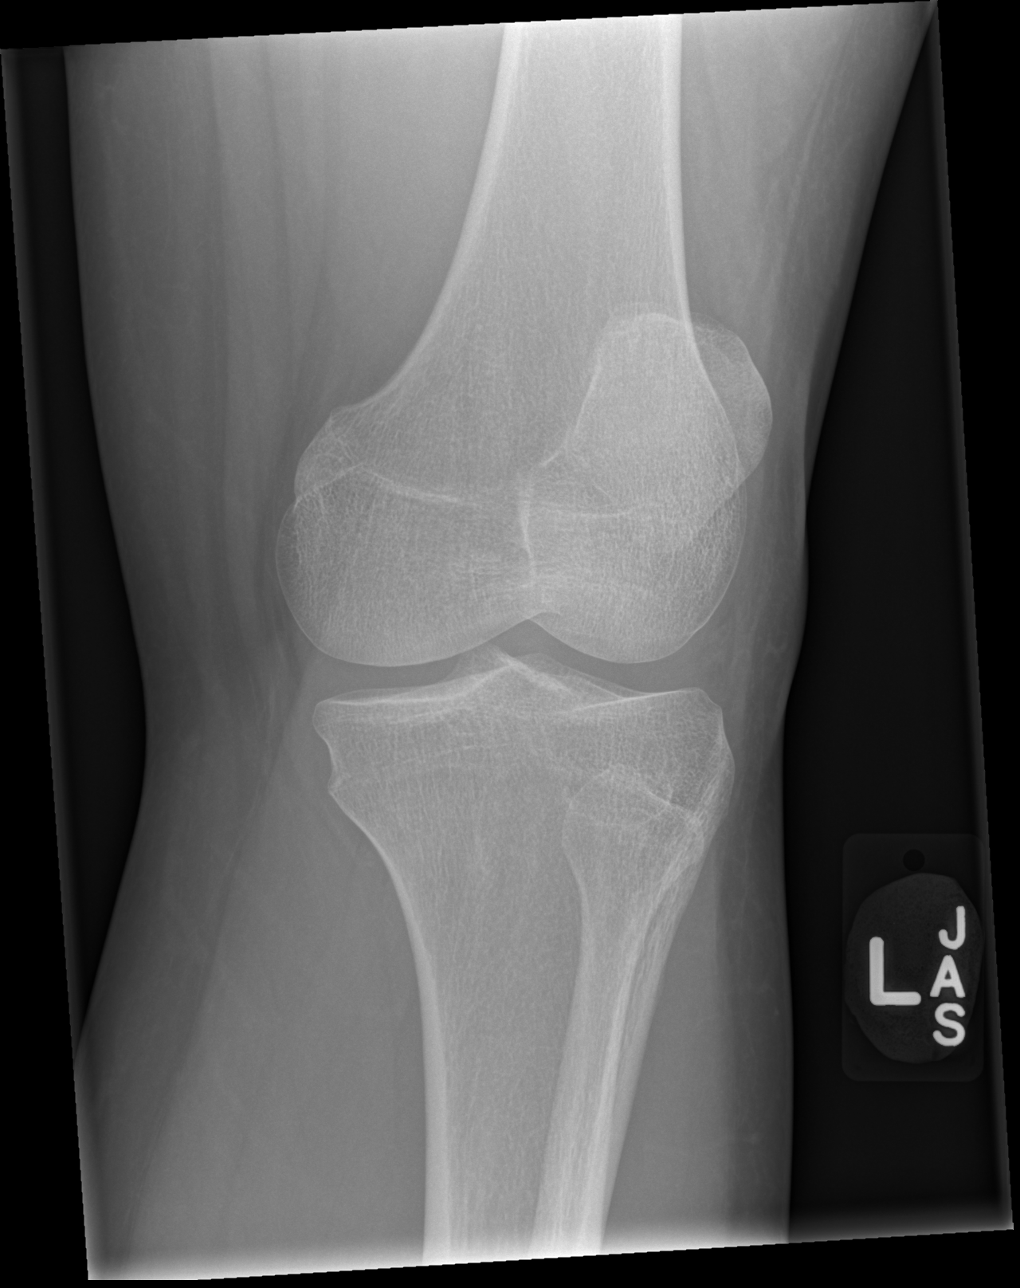

[x knee lat left]
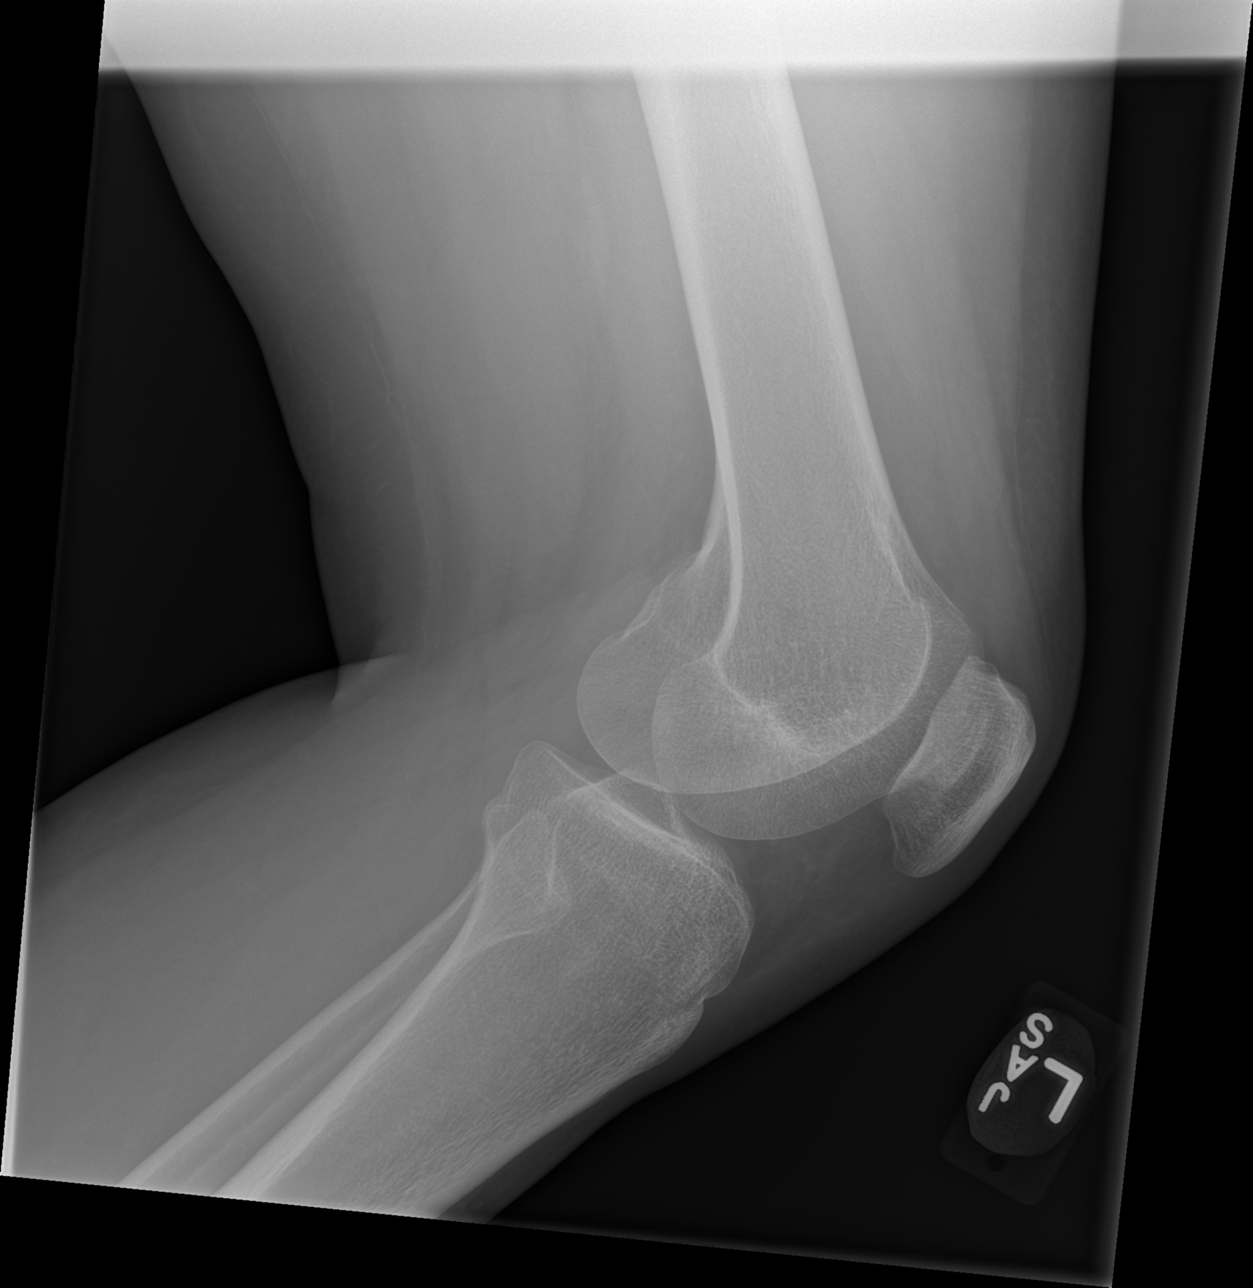

[4 of 4 positions shown; findings below may reference images not displayed]

FINDINGS: There is no evidence of fracture, dislocation, or joint effusion.
There is no evidence of arthropathy or other focal bone abnormality.
Soft tissues are unremarkable.
IMPRESSION: Negative.

## 2015-10-18 ENCOUNTER — Ambulatory Visit (INDEPENDENT_AMBULATORY_CARE_PROVIDER_SITE_OTHER): Payer: Medicaid Other | Admitting: Family Medicine

## 2015-10-18 ENCOUNTER — Encounter: Payer: Self-pay | Admitting: Family Medicine

## 2015-10-18 VITALS — BP 100/68 | HR 97 | Temp 98.4°F | Resp 16 | Wt 224.0 lb

## 2015-10-18 DIAGNOSIS — B078 Other viral warts: Secondary | ICD-10-CM

## 2015-10-18 DIAGNOSIS — F419 Anxiety disorder, unspecified: Secondary | ICD-10-CM | POA: Diagnosis not present

## 2015-10-18 DIAGNOSIS — L84 Corns and callosities: Secondary | ICD-10-CM | POA: Insufficient documentation

## 2015-10-18 DIAGNOSIS — B079 Viral wart, unspecified: Secondary | ICD-10-CM | POA: Diagnosis not present

## 2015-10-18 NOTE — Assessment & Plan Note (Addendum)
Area cleaned with rubbing alcohol, then pared using #10 blade; patient tolerated well; no bleeding; use blister pad to avoid pressure on the area; patient to use Duofilm at home daily (or every other day if irritated) for about 4 weeks or until this resolves; if not gone, we can refer to derm for treatment

## 2015-10-18 NOTE — Assessment & Plan Note (Signed)
No visible retained splinter; appears that wart has entered break in skin barrier and now has surrounding callus; pared away with #10 blade; blister pad recommended to avoid pressure

## 2015-10-18 NOTE — Assessment & Plan Note (Signed)
I explained that I am not familiar with using gabapentin for anxiety/depression; she was encouraged to talk with the provider who is prescribing her lexapro and buspar about refills of this; do NOT stop gabapentin cold [place], risk of precipitating seizure; would want to taper; she agrees to talk to that provider

## 2015-10-18 NOTE — Patient Instructions (Signed)
Please talk with the doctor managing your depression and anxiety about the gabapentin, because that is not something I am familiar with Never stop it cold [place] though Used Duofilm on the foot once a day, or every other day if irritated, and do that for 4+ weeks until it dissolves and goes away If not getting better, let us know and we can refer to dermatologist

## 2015-10-18 NOTE — Progress Notes (Signed)
BP 100/68 mmHg  Pulse 97  Temp(Src) 98.4 F (36.9 C) (Oral)  Resp 16  Wt 224 lb (101.606 kg)  SpO2 97%  LMP 10/16/2015   Subjective:    Patient ID: Alexandria [name], female    DOB: [birth year], 30 y.o.   MRN: [id number]  HPI: Alexandria [name] is a 30 y.o. female  Chief Complaint  Patient presents with  . Foreign Body in Skin    splinter in bottom of right foot   Patient is new to me; previous provider left the office; has a sore on the bottom of her right foot Was on the back porch and got a splinter in right foot 2 months ago; got most of it; pointy nail clippers in alcohol, the tried getting it out; kept hurting, then soaked in epsom salts, more came out Barefoot No fevers; energy level about the same Gabapentin; health dept put her on that for anxiety; also taking buspar and lexapro; was put on gabapentin years ago; on lexapro for 7 months, on buspar for two years; she feels like it works; short-term help; not used for drug or alcohol  Depression screen [facility] 2/9 10/18/2015 10/13/2014 10/19/2013  Decreased Interest 0 0 1  Down, Depressed, Hopeless 0 0 0  PHQ - 2 Score 0 0 1  Altered sleeping - - 1  Tired, decreased energy - - 1  Change in appetite - - 0  Feeling bad or failure about yourself  - - 0  Trouble concentrating - - 1  Moving slowly or fidgety/restless - - 0  Suicidal thoughts - - 0  PHQ-9 Score - - 4   Relevant past medical, surgical, family and social history reviewed Past Medical History  Diagnosis Date  . Depression   . Anxiety   . High risk HPV infection     followed by GYN  . CIN III (cervical intraepithelial neoplasia III)   . Enlarged thyroid    Family History  Problem Relation Age of Onset  . Cancer Mother 18    colon cancer  . Cancer Maternal Aunt     Cervical Cancer  . Cancer Paternal Aunt     Colon cancer  . Asthma Brother    Social History  Substance Use Topics  . Smoking status: Former Research scientist (life sciences)  . Smokeless tobacco: Never Used  .  Alcohol Use: No   Interim medical history since last visit reviewed. Allergies and medications reviewed  Review of Systems Per HPI unless specifically indicated above     Objective:    BP 100/68 mmHg  Pulse 97  Temp(Src) 98.4 F (36.9 C) (Oral)  Resp 16  Wt 224 lb (101.606 kg)  SpO2 97%  LMP 10/16/2015  Wt Readings from Last 3 Encounters:  10/18/15 224 lb (101.606 kg)  02/07/15 221 lb (100.245 kg)  02/07/15 221 lb (100.245 kg)    Physical Exam  Constitutional: She appears well-developed and well-nourished. No distress.  Cardiovascular: Normal rate.   Pulmonary/Chest: Effort normal.  Skin:  Callus and verrucous lesion outer area of plantar surface ball of right foot; pared using #10 blade, no visible retained splinter; no erythema, no fluctuance  Psychiatric: Her mood appears not anxious. She does not exhibit a depressed mood.  Good eye contact with examiner      Assessment & Plan:   Problem List Items Addressed This Visit      Musculoskeletal and Integument   Callus of foot    No visible retained splinter; appears that  wart has entered break in skin barrier and now has surrounding callus; pared away with #10 blade; blister pad recommended to avoid pressure      Verruca vulgaris - Primary    Area cleaned with rubbing alcohol, then pared using #10 blade; patient tolerated well; no bleeding; use blister pad to avoid pressure on the area; patient to use Duofilm at home daily (or every other day if irritated) for about 4 weeks or until this resolves; if not gone, we can refer to derm for treatment        Other   Anxiety    I explained that I am not familiar with using gabapentin for anxiety/depression; she was encouraged to talk with the provider who is prescribing her lexapro and buspar about refills of this; do NOT stop gabapentin cold [place], risk of precipitating seizure; would want to taper; she agrees to talk to that provider      Relevant Medications    escitalopram (LEXAPRO) 20 MG tablet   busPIRone (BUSPAR) 30 MG tablet      Follow up plan: No Follow-up on file. -- PRN  An after-visit summary was printed and given to the patient at [place].  Please see the patient instructions which may contain other information and recommendations beyond what is mentioned above in the assessment and plan.  Meds ordered this encounter  Medications  . escitalopram (LEXAPRO) 20 MG tablet    Sig: TAKE 1 TABLET (20 MG) BY ORAL ROUTE ONCE DAILY    Refill:  11  . busPIRone (BUSPAR) 30 MG tablet    Sig: TAKE 1 TABLET (30 MG) BY ORAL ROUTE 2 TIMES PER DAY    Refill:  11

## 2015-11-21 ENCOUNTER — Emergency Department
Admission: EM | Admit: 2015-11-21 | Discharge: 2015-11-21 | Disposition: A | Discharge status: Home / Self Care | Payer: Medicaid Other | Attending: Emergency Medicine | Admitting: Emergency Medicine

## 2015-11-21 ENCOUNTER — Ambulatory Visit: Payer: Medicaid Other | Admitting: Family Medicine

## 2015-11-21 DIAGNOSIS — Z5321 Procedure and treatment not carried out due to patient leaving prior to being seen by health care provider: Secondary | ICD-10-CM | POA: Diagnosis not present

## 2015-11-21 DIAGNOSIS — G43909 Migraine, unspecified, not intractable, without status migrainosus: Secondary | ICD-10-CM | POA: Diagnosis not present

## 2015-11-21 DIAGNOSIS — Z87891 Personal history of nicotine dependence: Secondary | ICD-10-CM | POA: Diagnosis not present

## 2015-11-21 NOTE — ED Triage Notes (Signed)
Patient ambulatory to triage with steady gait, without difficulty or distress noted; pt reports frontal HA today; denies hx of same; st recent sinus congestion

## 2016-04-28 NOTE — L&D Delivery Note (Signed)
Obstetrical Delivery Note   Date of Delivery:   04/21/2017 Primary OB:   [facility] Gestational Age/EDD: [redacted]w[redacted]d (Dated by 8wk3d ultrasound) Antepartum complications: anemia and anxiety/depression/ GBS positive, RH negative, obesity  Delivered By:   Dalia Heading, CNM  Delivery Type:   spontaneous vaginal delivery  Procedure Details:   Began pushing with baby at +1 station, but after little progress was made, an ultrasound revealed OP presentation. Advised to stop pushing, epidural rate was reduced to 8cc/hr, and patient placed in extreme right lateral position using the peanut ball. After about 45 min, patient began feeling more pelvic pressure. Pushing was resumed while still on side then in low Fowlers. Mother than pushed to deliver a viable female infant in direct OP presentation with nuchal cord x1. Cord reduced on perineum. Baby placed on mother's abdomen and dried and suctioned with bulb syringe. After a delayed cord clamping, the FOB cut the cord. After a brief evaluation by NNP under warmer, baby placed skin to skin with mother. Spontaneous delivery of intact placenta and 3 vessel cord with battledore insertion. Mild uterine atony followed, and responded to fundal massage, IV Pitocin and 800 mcg Cytotec PR. No cervical, vaginal or perineal lacerations seen. EBL 400 ml. Anesthesia:    epidural Intrapartum complications: Persistent occiput presentation, moderate meconium stained amniotic fluid, nuchal cord x 1 GBS:    Positive-adequately treated Laceration:    none Episiotomy:    none Placenta:    400 ml Baby:    Liveborn female, Apgars 7/9, weight pending    Dalia Heading, CNM

## 2016-05-06 ENCOUNTER — Encounter: Payer: Medicaid Other | Admitting: Family Medicine

## 2016-05-26 ENCOUNTER — Other Ambulatory Visit: Payer: Self-pay | Admitting: Family Medicine

## 2016-05-26 ENCOUNTER — Encounter: Payer: Self-pay | Admitting: Family Medicine

## 2016-05-26 ENCOUNTER — Ambulatory Visit (INDEPENDENT_AMBULATORY_CARE_PROVIDER_SITE_OTHER): Payer: Medicaid Other | Admitting: Family Medicine

## 2016-05-26 DIAGNOSIS — E042 Nontoxic multinodular goiter: Secondary | ICD-10-CM | POA: Diagnosis not present

## 2016-05-26 DIAGNOSIS — N898 Other specified noninflammatory disorders of vagina: Secondary | ICD-10-CM | POA: Diagnosis not present

## 2016-05-26 DIAGNOSIS — D485 Neoplasm of uncertain behavior of skin: Secondary | ICD-10-CM

## 2016-05-26 DIAGNOSIS — Z0000 Encounter for general adult medical examination without abnormal findings: Secondary | ICD-10-CM | POA: Diagnosis not present

## 2016-05-26 DIAGNOSIS — D069 Carcinoma in situ of cervix, unspecified: Secondary | ICD-10-CM

## 2016-05-26 DIAGNOSIS — F419 Anxiety disorder, unspecified: Secondary | ICD-10-CM

## 2016-05-26 LAB — CBC WITH DIFFERENTIAL/PLATELET
Basophils Absolute: 0 cells/uL (ref 0–200)
Basophils Relative: 0 %
EOS ABS: 243 {cells}/uL (ref 15–500)
Eosinophils Relative: 3 %
HCT: 43.9 % (ref 35.0–45.0)
Hemoglobin: 14.7 g/dL (ref 11.7–15.5)
Lymphocytes Relative: 39 %
Lymphs Abs: 3159 cells/uL (ref 850–3900)
MCH: 27.9 pg (ref 27.0–33.0)
MCHC: 33.5 g/dL (ref 32.0–36.0)
MCV: 83.3 fL (ref 80.0–100.0)
MONOS PCT: 8 %
MPV: 9.8 fL (ref 7.5–12.5)
Monocytes Absolute: 648 cells/uL (ref 200–950)
Neutro Abs: 4050 cells/uL (ref 1500–7800)
Neutrophils Relative %: 50 %
PLATELETS: 313 10*3/uL (ref 140–400)
RBC: 5.27 MIL/uL — ABNORMAL HIGH (ref 3.80–5.10)
RDW: 13.6 % (ref 11.0–15.0)
WBC: 8.1 10*3/uL (ref 3.8–10.8)

## 2016-05-26 LAB — TSH: TSH: 1.62 mIU/L

## 2016-05-26 LAB — T4, FREE: Free T4: 0.9 ng/dL (ref 0.8–1.8)

## 2016-05-26 MED ORDER — FLUOXETINE HCL 20 MG PO CAPS: 20.0000 mg | ORAL_CAPSULE | Freq: Every day | ORAL | 1 refills | Status: DC

## 2016-05-26 NOTE — Assessment & Plan Note (Signed)
Change SSRI to prozac with close f/u; patient does not want to take buspar any more

## 2016-05-26 NOTE — Assessment & Plan Note (Signed)
Recheck TSH and free T4 

## 2016-05-26 NOTE — Patient Instructions (Addendum)
Keep up the great job with weight loss Stop the escitalopram  Start the fluoxetine Follow-up in 3 weeks, call sooner if any issues  Health Maintenance, Female Introduction Adopting a healthy lifestyle and getting preventive care can go a long way to promote health and wellness. Talk with your health care provider about what schedule of regular examinations is right for you. This is a good chance for you to check in with your provider about disease prevention and staying healthy. In between checkups, there are plenty of things you can do on your own. Experts have done a lot of research about which lifestyle changes and preventive measures are most likely to keep you healthy. Ask your health care provider for more information. Weight and diet Eat a healthy diet  Be sure to include plenty of vegetables, fruits, low-fat dairy products, and lean protein.  Do not eat a lot of foods high in solid fats, added sugars, or salt.  Get regular exercise. This is one of the most important things you can do for your health.  Most adults should exercise for at least 150 minutes each week. The exercise should increase your heart rate and make you sweat (moderate-intensity exercise).  Most adults should also do strengthening exercises at least twice a week. This is in addition to the moderate-intensity exercise. Maintain a healthy weight  Body mass index (BMI) is a measurement that can be used to identify possible weight problems. It estimates body fat based on height and weight. Your health care provider can help determine your BMI and help you achieve or maintain a healthy weight.  For females 28 years of age and older:  A BMI below 18.5 is considered underweight.  A BMI of 18.5 to 24.9 is normal.  A BMI of 25 to 29.9 is considered overweight.  A BMI of 30 and above is considered obese. Watch levels of cholesterol and blood lipids  You should start having your blood tested for lipids and  cholesterol at 29 years of age, then have this test every 5 years.  You may need to have your cholesterol levels checked more often if:  Your lipid or cholesterol levels are high.  You are older than 29 years of age.  You are at high risk for heart disease. Cancer screening Lung Cancer  Lung cancer screening is recommended for adults 54-29 years old who are at high risk for lung cancer because of a history of smoking.  A yearly low-dose CT scan of the lungs is recommended for people who:  Currently smoke.  Have quit within the past 15 years.  Have at least a 30-pack-year history of smoking. A pack year is smoking an average of one pack of cigarettes a day for 1 year.  Yearly screening should continue until it has been 15 years since you quit.  Yearly screening should stop if you develop a health problem that would prevent you from having lung cancer treatment. Breast Cancer  Practice breast self-awareness. This means understanding how your breasts normally appear and feel.  It also means doing regular breast self-exams. Let your health care provider know about any changes, no matter how small.  If you are in your 20s or 30s, you should have a clinical breast exam (CBE) by a health care provider every 1-3 years as part of a regular health exam.  If you are 18 or older, have a CBE every year. Also consider having a breast X-ray (mammogram) every year.  If you have  a family history of breast cancer, talk to your health care provider about genetic screening.  If you are at high risk for breast cancer, talk to your health care provider about having an MRI and a mammogram every year.  Breast cancer gene (BRCA) assessment is recommended for women who have family members with BRCA-related cancers. BRCA-related cancers include:  Breast.  Ovarian.  Tubal.  Peritoneal cancers.  Results of the assessment will determine the need for genetic counseling and BRCA1 and BRCA2  testing. Cervical Cancer  Your health care provider may recommend that you be screened regularly for cancer of the pelvic organs (ovaries, uterus, and vagina). This screening involves a pelvic examination, including checking for microscopic changes to the surface of your cervix (Pap test). You may be encouraged to have this screening done every 3 years, beginning at age 60.  For women ages 24-65, health care providers may recommend pelvic exams and Pap testing every 3 years, or they may recommend the Pap and pelvic exam, combined with testing for human papilloma virus (HPV), every 5 years. Some types of HPV increase your risk of cervical cancer. Testing for HPV may also be done on women of any age with unclear Pap test results.  Other health care providers may not recommend any screening for nonpregnant women who are considered low risk for pelvic cancer and who do not have symptoms. Ask your health care provider if a screening pelvic exam is right for you.  If you have had past treatment for cervical cancer or a condition that could lead to cancer, you need Pap tests and screening for cancer for at least 20 years after your treatment. If Pap tests have been discontinued, your risk factors (such as having a new sexual partner) need to be reassessed to determine if screening should resume. Some women have medical problems that increase the chance of getting cervical cancer. In these cases, your health care provider may recommend more frequent screening and Pap tests. Colorectal Cancer  This type of cancer can be detected and often prevented.  Routine colorectal cancer screening usually begins at 29 years of age and continues through 29 years of age.  Your health care provider may recommend screening at an earlier age if you have risk factors for colon cancer.  Your health care provider may also recommend using home test kits to check for hidden blood in the stool.  A small camera at the end of a  tube can be used to examine your colon directly (sigmoidoscopy or colonoscopy). This is done to check for the earliest forms of colorectal cancer.  Routine screening usually begins at age 10.  Direct examination of the colon should be repeated every 5-10 years through 29 years of age. However, you may need to be screened more often if early forms of precancerous polyps or small growths are found. Skin Cancer  Check your skin from head to toe regularly.  Tell your health care provider about any new moles or changes in moles, especially if there is a change in a mole's shape or color.  Also tell your health care provider if you have a mole that is larger than the size of a pencil eraser.  Always use sunscreen. Apply sunscreen liberally and repeatedly throughout the day.  Protect yourself by wearing long sleeves, pants, a wide-brimmed hat, and sunglasses whenever you are outside. Heart disease, diabetes, and high blood pressure  High blood pressure causes heart disease and increases the risk of stroke. High  blood pressure is more likely to develop in:  People who have blood pressure in the high end of the normal range (130-139/85-89 mm Hg).  People who are overweight or obese.  People who are African American.  If you are 60-7 years of age, have your blood pressure checked every 3-5 years. If you are 34 years of age or older, have your blood pressure checked every year. You should have your blood pressure measured twice-once when you are at a hospital or clinic, and once when you are not at a hospital or clinic. Record the average of the two measurements. To check your blood pressure when you are not at a hospital or clinic, you can use:  An automated blood pressure machine at a pharmacy.  A home blood pressure monitor.  If you are between 50 years and 76 years old, ask your health care provider if you should take aspirin to prevent strokes.  Have regular diabetes screenings. This  involves taking a blood sample to check your fasting blood sugar level.  If you are at a normal weight and have a low risk for diabetes, have this test once every three years after 29 years of age.  If you are overweight and have a high risk for diabetes, consider being tested at a younger age or more often. Preventing infection Hepatitis B  If you have a higher risk for hepatitis B, you should be screened for this virus. You are considered at high risk for hepatitis B if:  You were born in a country where hepatitis B is common. Ask your health care provider which countries are considered high risk.  Your parents were born in a high-risk country, and you have not been immunized against hepatitis B (hepatitis B vaccine).  You have HIV or AIDS.  You use needles to inject street drugs.  You live with someone who has hepatitis B.  You have had sex with someone who has hepatitis B.  You get hemodialysis treatment.  You take certain medicines for conditions, including cancer, organ transplantation, and autoimmune conditions. Hepatitis C  Blood testing is recommended for:  Everyone born from [birth year] through 1963.  Anyone with known risk factors for hepatitis C. Sexually transmitted infections (STIs)  You should be screened for sexually transmitted infections (STIs) including gonorrhea and chlamydia if:  You are sexually active and are younger than 29 years of age.  You are older than 29 years of age and your health care provider tells you that you are at risk for this type of infection.  Your sexual activity has changed since you were last screened and you are at an increased risk for chlamydia or gonorrhea. Ask your health care provider if you are at risk.  If you do not have HIV, but are at risk, it may be recommended that you take a prescription medicine daily to prevent HIV infection. This is called pre-exposure prophylaxis (PrEP). You are considered at risk if:  You are  sexually active and do not regularly use condoms or know the HIV status of your partner(s).  You take drugs by injection.  You are sexually active with a partner who has HIV. Talk with your health care provider about whether you are at high risk of being infected with HIV. If you choose to begin PrEP, you should first be tested for HIV. You should then be tested every 3 months for as long as you are taking PrEP. Pregnancy  If you are premenopausal and you  may become pregnant, ask your health care provider about preconception counseling.  If you may become pregnant, take 400 to 800 micrograms (mcg) of folic acid every day.  If you want to prevent pregnancy, talk to your health care provider about birth control (contraception). Osteoporosis and menopause  Osteoporosis is a disease in which the bones lose minerals and strength with aging. This can result in serious bone fractures. Your risk for osteoporosis can be identified using a bone density scan.  If you are 62 years of age or older, or if you are at risk for osteoporosis and fractures, ask your health care provider if you should be screened.  Ask your health care provider whether you should take a calcium or vitamin D supplement to lower your risk for osteoporosis.  Menopause may have certain physical symptoms and risks.  Hormone replacement therapy may reduce some of these symptoms and risks. Talk to your health care provider about whether hormone replacement therapy is right for you. Follow these instructions at home:  Schedule regular health, dental, and eye exams.  Stay current with your immunizations.  Do not use any tobacco products including cigarettes, chewing tobacco, or electronic cigarettes.  If you are pregnant, do not drink alcohol.  If you are breastfeeding, limit how much and how often you drink alcohol.  Limit alcohol intake to no more than 1 drink per day for nonpregnant women. One drink equals 12 ounces of  beer, 5 ounces of wine, or 1 ounces of hard liquor.  Do not use street drugs.  Do not share needles.  Ask your health care provider for help if you need support or information about quitting drugs.  Tell your health care provider if you often feel depressed.  Tell your health care provider if you have ever been abused or do not feel safe at home. This information is not intended to replace advice given to you by your health care provider. Make sure you discuss any questions you have with your health care provider. Document Released: 10/28/2010 Document Revised: 09/20/2015 Document Reviewed: 01/16/2015  2017 Elsevier

## 2016-05-26 NOTE — Assessment & Plan Note (Signed)
USPSTF grade A and B recommendations reviewed with patient; age-appropriate recommendations, preventive care, screening tests, etc discussed and encouraged; healthy living encouraged; see AVS for patient education given to patient  

## 2016-05-26 NOTE — Assessment & Plan Note (Signed)
Recheck pap smear today

## 2016-05-26 NOTE — Assessment & Plan Note (Signed)
Wet prep 

## 2016-05-26 NOTE — Assessment & Plan Note (Signed)
Refer to derm, 2 mm dark irregular scallloped lesion LEFT forearm

## 2016-05-26 NOTE — Progress Notes (Signed)
Patient ID: Alexandria [name], female   DOB: 18-Sep-1985, 31 y.o.   MRN: [id number]   Subjective:   Alexandria [name] is a 31 y.o. female here for a complete physical exam  Interim issues since last visit: she was in the ER for a migrain in July 2017  Gaining weight, tired; last free T4 was slightly low Blood type A negative  USPSTF grade A and B recommendations Alcohol: no Depression: Depression screen [facility] 2/9 05/26/2016 10/18/2015 10/13/2014 10/19/2013  Decreased Interest 0 0 0 1  Down, Depressed, Hopeless 1 0 0 0  PHQ - 2 Score 1 0 0 1  Altered sleeping - - - 1  Tired, decreased energy - - - 1  Change in appetite - - - 0  Feeling bad or failure about yourself  - - - 0  Trouble concentrating - - - 1  Moving slowly or fidgety/restless - - - 0  Suicidal thoughts - - - 0  PHQ-9 Score - - - 4  she wants to change her medicine; since having baby, they put her on lexapro; buspar too; done breastfeeding; was on 20 mg of lexapro; sister takes prozac, worked best SSRI was prescribed by gyn She does not like the way buspar makes her feel; wants to stop She used to take xanax; she tried counseling; she knows what her issues are from; not easy to function better; mother had depression growing up; other family members with depression and anxiety  Hypertension: controlled Obesity: lost 9 pounds since last visit; changed diet Tobacco use:  HIV, hep B, hep C: HIV negative in 2015, will rescreen STD testing and prevention (chl/gon/syphilis): no others desired Lipids: cereal this morning Glucose: cereal this morning; sweet tea about two hours ago Colorectal cancer: mother died from colon cancer at age 35; poor lifestyle habits; patient has not had a colonoscopy; no other cancers in the family known Breast cancer: no lumps BRCA gene screening: no fam hx Intimate partner violence: no Cervical cancer screening: March 2016; last one was normal; frequent prior; did have HPV, but last pap smear normal  March 2016, NIL, negative GC, negative chl Diet: changed diet Exercise: started exercising, walks up and down driveway Skin cancer: no  Hx of goiter; had biopsy done; last T4 was abnormal; numbers were off; no follow-up since 2015, just feeling it  Past Medical History:  Diagnosis Date  . Anxiety   . CIN III (cervical intraepithelial neoplasia III)   . Depression   . Enlarged thyroid   . High risk HPV infection    followed by GYN   Past Surgical History:  Procedure Laterality Date  . NO PAST SURGERIES     Family History  Problem Relation Age of Onset  . Cancer Mother 41    colon cancer  . Cancer Maternal Aunt     Cervical Cancer  . Cancer Paternal Aunt     Colon cancer  . Asthma Brother    Social History  Substance Use Topics  . Smoking status: Former Games developer  . Smokeless tobacco: Never Used  . Alcohol use No   Review of Systems  Objective:   Vitals:   05/26/16 1406  BP: 124/68  Pulse: 98  Resp: 14  Temp: 98.3 F (36.8 C)  TempSrc: Oral  SpO2: 96%  Weight: 221 lb 14.4 oz (100.7 kg)  Height: 5' 3.6" (1.615 m)   Body mass index is 38.57 kg/m. Wt Readings from Last 3 Encounters:  05/26/16 221 lb 14.4  oz (100.7 kg)  11/21/15 230 lb (104.3 kg)  10/18/15 224 lb (101.6 kg)   Physical Exam  Constitutional: She appears well-developed and well-nourished.  HENT:  Head: Normocephalic and atraumatic.  Eyes: Conjunctivae and EOM are normal. Right eye exhibits no hordeolum. Left eye exhibits no hordeolum. No scleral icterus.  Neck: Carotid bruit is not present. Thyromegaly present.  Cardiovascular: Normal rate, regular rhythm, S1 normal, S2 normal and normal heart sounds.   No extrasystoles are present.  Pulmonary/Chest: Effort normal and breath sounds normal. No respiratory distress. Right breast exhibits no inverted nipple, no mass, no nipple discharge, no skin change and no tenderness. Left breast exhibits no inverted nipple, no mass, no nipple discharge, no skin  change and no tenderness. Breasts are symmetrical.  Abdominal: Soft. Normal appearance and bowel sounds are normal. She exhibits no distension, no abdominal bruit, no pulsatile midline mass and no mass. There is no hepatosplenomegaly. There is no tenderness. No hernia.  Genitourinary: Uterus normal. Pelvic exam was performed with patient prone. There is no rash or lesion on the right labia. There is no rash or lesion on the left labia. Cervix exhibits no motion tenderness. Right adnexum displays no mass, no tenderness and no fullness. Left adnexum displays no mass, no tenderness and no fullness. Vaginal discharge (thin watery discharge) found.  Musculoskeletal: Normal range of motion. She exhibits no edema.  Lymphadenopathy:       Head (right side): No submandibular adenopathy present.       Head (left side): No submandibular adenopathy present.    She has no cervical adenopathy.    She has no axillary adenopathy.  Neurological: She is alert. She displays no tremor. No cranial nerve deficit. She exhibits normal muscle tone. Gait normal.  Skin: Skin is warm and dry. Lesion (very dark irregular scalloped lesion 2 mm on left arm) noted. No bruising and no ecchymosis noted. No cyanosis. No pallor.  Psychiatric: Her speech is normal and behavior is normal. Thought content normal. Her mood appears not anxious. She does not exhibit a depressed mood.    Assessment/Plan:   Problem List Items Addressed This Visit      Endocrine   Goiter, nontoxic, multinodular    Recheck TSH and free T4      Relevant Orders   TSH (Completed)   T4, free (Completed)     Musculoskeletal and Integument   Neoplasm of uncertain behavior of skin    Refer to derm, 2 mm dark irregular scallloped lesion LEFT forearm      Relevant Orders   Ambulatory referral to Dermatology     Genitourinary   CIN III (cervical intraepithelial neoplasia grade III) with severe dysplasia    Recheck pap smear today      Relevant  Orders   Pap IG and HPV (high risk) DNA detection     Other   Vaginal discharge    Wet prep      Relevant Orders   WET PREP BY MOLECULAR PROBE (Completed)   Preventative health care    USPSTF grade A and B recommendations reviewed with patient; age-appropriate recommendations, preventive care, screening tests, etc discussed and encouraged; healthy living encouraged; see AVS for patient education given to patient       Relevant Orders   Lipid panel (Completed)   CBC with Differential/Platelet (Completed)   COMPLETE METABOLIC PANEL WITH GFR (Completed)   Anxiety    Change SSRI to prozac with close f/u; patient does not want to take buspar  any more          Meds ordered this encounter  Medications  . Multiple Vitamin (MULTIVITAMIN) tablet    Sig: Take 1 tablet by mouth daily.  Marland Kitchen DISCONTD: FLUoxetine (PROZAC) 20 MG capsule    Sig: Take 1 capsule (20 mg total) by mouth daily. (for mood; stop escitalopram)    Dispense:  90 capsule    Refill:  1    We are stopping escitalopram   Orders Placed This Encounter  Procedures  . WET PREP BY MOLECULAR PROBE  . Lipid panel  . CBC with Differential/Platelet  . TSH  . T4, free  . COMPLETE METABOLIC PANEL WITH GFR  . Ambulatory referral to Dermatology    Referral Priority:   Routine    Referral Type:   Consultation    Referral Reason:   Specialty Services Required    Requested Specialty:   Dermatology    Number of Visits Requested:   1    Follow up plan: Return in about 3 weeks (around 06/16/2016) for medicine management.  An After Visit Summary was printed and given to the patient.

## 2016-05-27 ENCOUNTER — Encounter: Payer: Self-pay | Admitting: Family Medicine

## 2016-05-27 ENCOUNTER — Other Ambulatory Visit: Payer: Self-pay | Admitting: Family Medicine

## 2016-05-27 DIAGNOSIS — F419 Anxiety disorder, unspecified: Secondary | ICD-10-CM

## 2016-05-27 LAB — WET PREP BY MOLECULAR PROBE
CANDIDA SPECIES: NEGATIVE
GARDNERELLA VAGINALIS: POSITIVE — AB
Trichomonas vaginosis: NEGATIVE

## 2016-05-27 LAB — COMPLETE METABOLIC PANEL WITH GFR
ALBUMIN: 4.3 g/dL (ref 3.6–5.1)
ALK PHOS: 86 U/L (ref 33–115)
ALT: 12 U/L (ref 6–29)
AST: 13 U/L (ref 10–30)
BILIRUBIN TOTAL: 0.3 mg/dL (ref 0.2–1.2)
BUN: 11 mg/dL (ref 7–25)
CALCIUM: 9.7 mg/dL (ref 8.6–10.2)
CO2: 25 mmol/L (ref 20–31)
Chloride: 105 mmol/L (ref 98–110)
Creat: 0.64 mg/dL (ref 0.50–1.10)
Glucose, Bld: 84 mg/dL (ref 65–99)
POTASSIUM: 4.4 mmol/L (ref 3.5–5.3)
SODIUM: 138 mmol/L (ref 135–146)
TOTAL PROTEIN: 7.3 g/dL (ref 6.1–8.1)

## 2016-05-27 LAB — LIPID PANEL
CHOL/HDL RATIO: 3.3 ratio (ref <5.0)
CHOLESTEROL: 192 mg/dL (ref <200)
HDL: 58 mg/dL (ref >50)
LDL Cholesterol: 102 mg/dL — ABNORMAL HIGH (ref <100)
Triglycerides: 161 mg/dL — ABNORMAL HIGH (ref <150)
VLDL: 32 mg/dL — AB (ref <30)

## 2016-05-27 MED ORDER — METRONIDAZOLE 500 MG PO TABS: 500.0000 mg | ORAL_TABLET | Freq: Two times a day (BID) | ORAL | 0 refills | Status: DC

## 2016-05-27 NOTE — Progress Notes (Signed)
Metronidazole sent in to pharmacy; note to pt through [facility]

## 2016-05-28 ENCOUNTER — Telehealth: Payer: Self-pay

## 2016-05-28 DIAGNOSIS — G479 Sleep disorder, unspecified: Secondary | ICD-10-CM

## 2016-05-28 DIAGNOSIS — R519 Headache, unspecified: Secondary | ICD-10-CM

## 2016-05-28 DIAGNOSIS — R718 Other abnormality of red blood cells: Secondary | ICD-10-CM

## 2016-05-28 DIAGNOSIS — R0683 Snoring: Secondary | ICD-10-CM

## 2016-05-28 DIAGNOSIS — R5383 Other fatigue: Secondary | ICD-10-CM

## 2016-05-28 DIAGNOSIS — G4733 Obstructive sleep apnea (adult) (pediatric): Secondary | ICD-10-CM

## 2016-05-28 DIAGNOSIS — G4730 Sleep apnea, unspecified: Secondary | ICD-10-CM

## 2016-05-28 DIAGNOSIS — R51 Headache: Secondary | ICD-10-CM

## 2016-05-28 LAB — PAP IG AND HPV HIGH-RISK: HPV DNA HIGH RISK: NOT DETECTED

## 2016-05-28 MED ORDER — FLUOXETINE HCL 20 MG PO CAPS: 60.0000 mg | ORAL_CAPSULE | Freq: Every day | ORAL | 0 refills | Status: DC

## 2016-05-28 NOTE — Telephone Encounter (Signed)
This was from my note to her regarding her lab results yesterday: "Your red blood cell count is just a little elevated. Do you snore? Is there any chance that you might have sleep apnea? If so, please let me know so we can get a sleep study." ------------------------------- My apologies on the prescription; she should be taking 60 mg of fluoxetine (three of the 20 mg pills) to equal 20 mg of Lexapro; I sent a new Rx I called and explained she can take the extra dose now if she only took 20 mg earlier today; should total 3 pills a day, 60 mg a day  I also explained that I wanted to hear from her if she snores or might have sleep apnea before putting in any referral; that's why there is no referral I asked her to call back and let us know if she snores or stops breathing in her sleep, or if she wakes herself up gasping for air in the middle of the night, or if anyone has witnessed her choking or gasping in her sleep If so, please add that diagnosis and refer to pulmonology for evaluation for possible OSA, along with diagnosis of elevated RBC Thank you

## 2016-05-28 NOTE — Telephone Encounter (Signed)
Patient is confused about her Prozac dosage. Patient stated she was told at her visit to take 3 caps of the 20 mg Prozac since she was on 60 mg lexpro. Her prescription states 20 mg once a day. She would like to know what she need to take. She mention that you wanted her to see a specialist  about sleep apnea. I don't see a referral. Do you want me to put that in?

## 2016-05-29 NOTE — Telephone Encounter (Signed)
Put in the referral since she is snoring g@ night, waking up gasping for air, Fatigue due to not getting sleep she need due to getting up feeling like she need to get, day time sleepiness and intractable headaches.

## 2016-05-30 NOTE — Assessment & Plan Note (Signed)
Refer to psych per patient request

## 2016-06-02 ENCOUNTER — Other Ambulatory Visit: Payer: Self-pay | Admitting: Family Medicine

## 2016-06-02 DIAGNOSIS — R87610 Atypical squamous cells of undetermined significance on cytologic smear of cervix (ASC-US): Secondary | ICD-10-CM | POA: Insufficient documentation

## 2016-06-02 NOTE — Progress Notes (Signed)
Refer back to gyn.   

## 2016-06-02 NOTE — Assessment & Plan Note (Signed)
Refer back to GYN; insufficient/absent endocervical cells; ASCUS

## 2016-06-04 ENCOUNTER — Telehealth: Payer: Self-pay

## 2016-06-04 NOTE — Telephone Encounter (Signed)
Patient called stated that she got a call from [facility] regarding OSA but she cannot get in until 07/07/16 @ 10am. She will be seeing Dr. Laverle Hobby.  She wanted to know if there was anything that we can do to get her in sooner since her boyfriend actually noticed her stop breathing for about 30-45 secs.  Could you please call and do a MD to MD consult?  His office number is [phone]

## 2016-06-04 NOTE — Telephone Encounter (Signed)
Two other types of doctors do this testing and treatment  Please see if ENT can get her in sooner; I'm betting that one of them can get her in within a week If so, please enter referral for snoring, suspected OSA

## 2016-06-05 NOTE — Telephone Encounter (Signed)
I called [facility] to see if they had any openings to see this patient for OSA, but they said since she will be a new patient the earliest they could see her will be the end of the month so I called [facility] back at [phone]  and they said that they will add her to the waiting list but to call their [facility] office at [phone] to see if they have something sooner. I called them and since it is for sleep they do not have anything until the end of March.   I then called [facility] to see if they had any openings and was told that Dr. Jennings Books has nothing until the 6th since he was out sick with the flu a few days and a lot of his patients was rescheduled.  She checked Dr. Lannie Fields scheduled and stumbled upon an opening on 06/11/16 at 8:15am.  Patient has been informed via voicemail of new appt.

## 2016-06-05 NOTE — Telephone Encounter (Signed)
Amazing work; thank you so much

## 2016-07-04 ENCOUNTER — Other Ambulatory Visit: Payer: Self-pay | Admitting: Family Medicine

## 2016-07-04 NOTE — Telephone Encounter (Signed)
Pt scheduled an appt and informed of refill

## 2016-07-04 NOTE — Telephone Encounter (Signed)
Patient needs an appt please; she was supposed to be seen 3 weeks after last visit I'll refill med, but hope to see her soon Thank you

## 2016-07-07 ENCOUNTER — Institutional Professional Consult (permissible substitution): Payer: Medicaid Other | Admitting: Internal Medicine

## 2016-07-11 ENCOUNTER — Ambulatory Visit (INDEPENDENT_AMBULATORY_CARE_PROVIDER_SITE_OTHER): Payer: Medicaid Other | Admitting: Obstetrics and Gynecology

## 2016-07-11 ENCOUNTER — Ambulatory Visit: Payer: Medicaid Other | Admitting: Obstetrics and Gynecology

## 2016-07-11 ENCOUNTER — Ambulatory Visit (INDEPENDENT_AMBULATORY_CARE_PROVIDER_SITE_OTHER): Payer: Medicaid Other

## 2016-07-11 ENCOUNTER — Encounter: Payer: Self-pay | Admitting: Obstetrics and Gynecology

## 2016-07-11 VITALS — BP 118/78 | Ht 64.0 in | Wt 260.0 lb

## 2016-07-11 DIAGNOSIS — D069 Carcinoma in situ of cervix, unspecified: Secondary | ICD-10-CM | POA: Diagnosis not present

## 2016-07-11 DIAGNOSIS — F339 Major depressive disorder, recurrent, unspecified: Secondary | ICD-10-CM | POA: Insufficient documentation

## 2016-07-11 DIAGNOSIS — R87610 Atypical squamous cells of undetermined significance on cytologic smear of cervix (ASC-US): Secondary | ICD-10-CM | POA: Diagnosis not present

## 2016-07-11 NOTE — Progress Notes (Addendum)
Obstetrics & Gynecology Office Visit   Chief Complaint:  Chief Complaint  Patient presents with  . Colposcopy  . Referral Appointment    History of Present Illness: The patient is a 31 year old [id number] who presents in referral from Dr. Enid Derry from [place] for history of abnormal Pap smears with history of LEEP procedure and recent abnormal Pap smear showing ASCUS cytology with negative HPV and insufficient endocervical component. The patient states that about 4 years ago or so she underwent a LEEP procedure. Since that time she states that she has had progressively more normal pap smears. In 03/2016 she had a normal cytology pap smear without testing for HPV.  Recently she had the pap smear described above.  She was referred for management.  Review of Systems: Review of Systems  Constitutional: Negative.   HENT: Negative.   Eyes: Negative.   Respiratory: Negative.   Cardiovascular: Negative.   Gastrointestinal: Negative.   Genitourinary: Negative.   Musculoskeletal: Negative.   Skin: Negative.   Neurological: Negative.   Psychiatric/Behavioral: Negative.     Past Medical History:  Past Medical History:  Diagnosis Date  . Anxiety   . CIN III (cervical intraepithelial neoplasia III)   . Depression   . Enlarged thyroid   . High risk HPV infection    followed by GYN    Past Surgical History:  Past Surgical History:  Procedure Laterality Date  . CERVICAL BIOPSY  W/ LOOP ELECTRODE EXCISION    . COLPOSCOPY    . NO PAST SURGERIES      Gynecologic History: Patient's last menstrual period was 06/21/2016.  Obstetric History: [id number]  Family History  Problem Relation Age of Onset  . Cancer Mother 60    colon cancer  . Cancer Maternal Aunt     Cervical Cancer  . Cancer Paternal Aunt     Colon cancer  . Asthma Brother     Social History   Social History  . Marital status: Single    Spouse name: N/A  . Number of children: N/A  . Years of  education: N/A   Occupational History  . Not on file.   Social History Main Topics  . Smoking status: Former Research scientist (life sciences)  . Smokeless tobacco: Never Used  . Alcohol use No  . Drug use: No  . Sexual activity: Yes    Birth control/ protection: Pill   Other Topics Concern  . Not on file   Social History Narrative  . No narrative on file   Allergies: No Known Allergies  Medications:   Medication Sig Start Date End Date Taking? Authorizing Provider  FLUoxetine (PROZAC) 20 MG capsule TAKE 3 CAPSULES (60 MG TOTAL) BY MOUTH DAILY. (FOR MOOD STOP ESCITALOPRAM) 07/04/16  Yes Arnetha Courser, MD  Multiple Vitamin (MULTIVITAMIN) tablet Take 1 tablet by mouth daily.   Yes Historical Provider, MD  metroNIDAZOLE (FLAGYL) 500 MG tablet Take 1 tablet (500 mg total) by mouth 2 (two) times daily. No alcohol or cold medicines that contain alcohol while on this med Patient not taking: Reported on 07/11/2016 05/27/16   Arnetha Courser, MD    Physical Exam BP 118/78   Ht 5\' 4"  (1.626 m)   Wt 260 lb (117.9 kg)   LMP 06/21/2016   BMI 44.63 kg/m   Patient's last menstrual period was 06/21/2016.  General: NAD HEENT: normocephalic, anicteric Thyroid: no enlargement, no palpable nodules Pulmonary: No increased work of breathing Cardiovascular: RRR, distal pulses 2+ Abdomen: NABS, soft,  non-tender, non-distended.  Umbilicus without lesions.  No hepatomegaly, splenomegaly or masses palpable. No evidence of hernia  Genitourinary:  External: Normal external female genitalia.  Normal urethral meatus, normal  Bartholin's and Skene's glands.    Vagina: Normal vaginal mucosa, no evidence of prolapse.    Cervix: Grossly normal in appearance, no bleeding. Cells collected for pap smear study.   Uterus: Non-enlarged, mobile, normal contour.  No CMT  Adnexa: ovaries non-enlarged, no adnexal masses  Rectal: deferred  Lymphatic: no evidence of inguinal lymphadenopathy Extremities: no edema, erythema, or  tenderness Neurologic: Grossly intact Psychiatric: mood appropriate, affect full  Female chaperone present for pelvic and breast  portions of the physical exam  Assessment: 31 y.o. [id number] referred for history of severely abnormal Pap smears necessitating a LEEP procedure several years ago. Since that time she has had normalizing and normal Pap smears. Recently she had an ASCUS, HPV negative Pap smear and was referred for ongoing management.    Plan: Problem List Items Addressed This Visit    CIN III (cervical intraepithelial neoplasia grade III) with severe dysplasia   Relevant Orders   IGP, Aptima HPV, rfx 16/18,45   ASCUS of cervix with negative high risk HPV - Primary   Relevant Orders   IGP, Aptima HPV, rfx 16/18,45     Given the patient's more recent history of normal Pap smears and the absence of HPV on her most recent Pap smear, a long discussion was held with the patient regarding management today. Ultimately, decision was made to repeat the Pap smear to attempt to get an adequate endocervical component. We will follow up based on the Pap smear findings. If any severe abnormality or abnormality greater than ASCUS, we'll recommend colposcopy. The patient is willing to undergo Pap smear today, deferring a colposcopy, with the understanding that she may need a colposcopy in the near future anyway.   Prentice Docker, MD 07/11/2016 11:19 AM   CC: Arnetha Courser, MD [address] [place], [place] [address]  [place] (07/15/16): Pap smear result was negative with negative HPV.  Endocervical component was present.  I recommend a repeat pap smear with testing for HPV in one year.

## 2016-07-14 ENCOUNTER — Encounter: Payer: Self-pay | Admitting: Family Medicine

## 2016-07-14 ENCOUNTER — Ambulatory Visit (INDEPENDENT_AMBULATORY_CARE_PROVIDER_SITE_OTHER): Payer: Medicaid Other | Admitting: Family Medicine

## 2016-07-14 DIAGNOSIS — E669 Obesity, unspecified: Secondary | ICD-10-CM

## 2016-07-14 DIAGNOSIS — F419 Anxiety disorder, unspecified: Secondary | ICD-10-CM | POA: Diagnosis not present

## 2016-07-14 DIAGNOSIS — F3341 Major depressive disorder, recurrent, in partial remission: Secondary | ICD-10-CM

## 2016-07-14 MED ORDER — FLUOXETINE HCL 20 MG PO CAPS: 60.0000 mg | ORAL_CAPSULE | Freq: Every day | ORAL | 6 refills | Status: DC

## 2016-07-14 NOTE — Assessment & Plan Note (Signed)
Continue current SSRI; sounds to be working well without any mania or hypomania; refills provided, will see her back in a little over 6 months

## 2016-07-14 NOTE — Patient Instructions (Signed)
Keep up the great job with weight loss efforts Please call if I can be of service before next appointment

## 2016-07-14 NOTE — Assessment & Plan Note (Signed)
Encouragement given to keep up efforts at weight loss

## 2016-07-14 NOTE — Progress Notes (Signed)
BP 124/82   Pulse 90   Temp 98.3 F (36.8 C) (Oral)   Resp 14   Wt 218 lb 14.4 oz (99.3 kg)   LMP 06/02/2016   SpO2 98%   BMI 37.57 kg/m    Subjective:    Patient ID: Alexandria [name], female    DOB: [birth year], 31 y.o.   MRN: [id number]  HPI: Alexandria [name] is a 31 y.o. female  Chief Complaint  Patient presents with  . Medication Refill   She feels "normal" now She was started on the prozac and could tell that she was in a better mood in a week She feels a lot better She is glad that she didn't need any more xanax Not having to use buspar No mania or hypomania Has been dealing with this for years; has been off and on the medicine; tries to come off when feeling better Sister is doing well on this medicine No headaches with the medicine Good appetite with the medicine  She had to reschedule her appt to see pulmonologist about possible sleep apnea She is losing weight She is staying away from smoking Drinking more water Not sleeping on her back Still snoring and will go to sleep doctor Mother had sleep apnea; mother had restless legs syndrome too  She went for her pap smear; he got a really good sample she says and waiting on results, Dr. Glennon Mac (GYN)  Depression screen [facility] 2/9 07/14/2016 05/26/2016 10/18/2015 10/13/2014 10/19/2013  Decreased Interest 0 0 0 0 1  Down, Depressed, Hopeless 0 1 0 0 0  PHQ - 2 Score 0 1 0 0 1  Altered sleeping - - - - 1  Tired, decreased energy - - - - 1  Change in appetite - - - - 0  Feeling bad or failure about yourself  - - - - 0  Trouble concentrating - - - - 1  Moving slowly or fidgety/restless - - - - 0  Suicidal thoughts - - - - 0  PHQ-9 Score - - - - 4    Relevant past medical, surgical, family and social history reviewed Past Medical History:  Diagnosis Date  . Anxiety   . CIN III (cervical intraepithelial neoplasia III)   . Depression   . Enlarged thyroid   . High risk HPV infection    followed by GYN   Past  Surgical History:  Procedure Laterality Date  . CERVICAL BIOPSY  W/ LOOP ELECTRODE EXCISION    . COLPOSCOPY    . NO PAST SURGERIES     Family History  Problem Relation Age of Onset  . Cancer Mother 24    colon cancer  . Insomnia Mother   . Cancer Maternal Aunt     Cervical Cancer  . Cancer Paternal Aunt     Colon cancer  . Asthma Brother   . Diabetes Maternal Grandmother   . Diabetes Maternal Grandfather    Social History  Substance Use Topics  . Smoking status: Former Research scientist (life sciences)  . Smokeless tobacco: Never Used  . Alcohol use No   Interim medical history since last visit reviewed. Allergies and medications reviewed  Review of Systems Per HPI unless specifically indicated above     Objective:    BP 124/82   Pulse 90   Temp 98.3 F (36.8 C) (Oral)   Resp 14   Wt 218 lb 14.4 oz (99.3 kg)   LMP 06/02/2016   SpO2 98%   BMI 37.57 kg/m  Wt Readings from Last 3 Encounters:  07/14/16 218 lb 14.4 oz (99.3 kg)  07/11/16 260 lb (117.9 kg)  05/26/16 221 lb 14.4 oz (100.7 kg)  * the weight on 07/11/16 was probably 220 pounds, not 260 pounds *  Physical Exam  Constitutional: She appears well-developed and well-nourished. No distress.  Eyes: EOM are normal. No scleral icterus.  Neck: No thyromegaly present.  Cardiovascular: Normal rate.   Pulmonary/Chest: Effort normal.  Abdominal: She exhibits no distension.  Skin: No pallor.  Psychiatric: She has a normal mood and affect. Her behavior is normal. Judgment and thought content normal. Her mood appears not anxious. Her speech is not rapid and/or pressured and not delayed. She is not agitated and not slowed. She does not exhibit a depressed mood. She expresses no homicidal and no suicidal ideation.  Good eye contact with examiner      Assessment & Plan:   Problem List Items Addressed This Visit      Other   Obesity (BMI 35.0-39.9 without comorbidity)    Encouragement given to keep up efforts at weight loss       Depression    Continue current SSRI; sounds to be working well without any mania or hypomania; refills provided, will see her back in a little over 6 months      Relevant Medications   FLUoxetine (PROZAC) 20 MG capsule   Anxiety    Continue current SSRI; so glad to hear that patient does not need benzo or buspar and the SSRI is handling her symptoms alone      Relevant Medications   FLUoxetine (PROZAC) 20 MG capsule       Follow up plan: Return in about 6 months (around 01/26/2017) for visit with Dr. Sanda Klein.  An after-visit summary was printed and given to the patient at [facility].  Please see the patient instructions which may contain other information and recommendations beyond what is mentioned above in the assessment and plan.  Meds ordered this encounter  Medications  . FLUoxetine (PROZAC) 20 MG capsule    Sig: Take 3 capsules (60 mg total) by mouth daily. (for mood; stop escitalopram)    Dispense:  90 capsule    Refill:  6    No orders of the defined types were placed in this encounter.

## 2016-07-14 NOTE — Assessment & Plan Note (Signed)
Continue current SSRI; so glad to hear that patient does not need benzo or buspar and the SSRI is handling her symptoms alone

## 2016-07-15 ENCOUNTER — Encounter: Payer: Self-pay | Admitting: Obstetrics and Gynecology

## 2016-07-15 LAB — IGP, APTIMA HPV, RFX 16/18,45
HPV APTIMA: NEGATIVE
PAP Smear Comment: 0

## 2016-07-22 DIAGNOSIS — D485 Neoplasm of uncertain behavior of skin: Secondary | ICD-10-CM | POA: Diagnosis not present

## 2016-07-22 DIAGNOSIS — L700 Acne vulgaris: Secondary | ICD-10-CM | POA: Diagnosis not present

## 2016-07-22 DIAGNOSIS — D225 Melanocytic nevi of trunk: Secondary | ICD-10-CM | POA: Diagnosis not present

## 2016-08-26 ENCOUNTER — Encounter: Payer: Self-pay | Admitting: Internal Medicine

## 2016-08-26 ENCOUNTER — Ambulatory Visit (INDEPENDENT_AMBULATORY_CARE_PROVIDER_SITE_OTHER): Payer: Medicaid Other | Admitting: Internal Medicine

## 2016-08-26 VITALS — BP 104/64 | HR 86 | Resp 16 | Ht 64.0 in | Wt 216.0 lb

## 2016-08-26 DIAGNOSIS — E6609 Other obesity due to excess calories: Secondary | ICD-10-CM | POA: Diagnosis not present

## 2016-08-26 DIAGNOSIS — G4719 Other hypersomnia: Secondary | ICD-10-CM

## 2016-08-26 NOTE — Addendum Note (Signed)
Addended by: Oscar La R on: 08/26/2016 09:13 AM   Modules accepted: Orders

## 2016-08-26 NOTE — Progress Notes (Signed)
[facility] Consultation      Assessment and Plan:  Excessive daytime sleepiness.  --Will send for sleep study.   Morning headaches.  Obesity.  -Weight loss may be beneficial  Nicotine abuse.  --Recently quit smoking, about 3 months.   Depression. -Currently on fluoxetine, which may cause daytime sleepiness and cause REM sleep suppression, contributing to daytime sleepiness.   Date: 08/26/2016  MRN# [id number] Alexandria [name] 11-18-1985  Referring Physician: Dr. lada for snoring.   Alexandria [name] is a 31 y.o. old female seen in consultation for chief complaint of:    Chief Complaint  Patient presents with  . Advice Only  . Sleep Apnea    HPI:   She has been feeling tired, she goes to bed between 9 to 11 pm, usually wakes up between 6 and 7 am.  When wakes in am, feels tired, but this has been better since quitting smoking.   She has been snoring and and boyfriend has noted that she has been stopping breathing in her sleep.    PMHX:   Past Medical History:  Diagnosis Date  . Anxiety   . CIN III (cervical intraepithelial neoplasia III)   . Depression   . Enlarged thyroid   . High risk HPV infection    followed by GYN   Surgical Hx:  Past Surgical History:  Procedure Laterality Date  . CERVICAL BIOPSY  W/ LOOP ELECTRODE EXCISION    . COLPOSCOPY    . NO PAST SURGERIES     Family Hx:  Family History  Problem Relation Age of Onset  . Cancer Mother 27    colon cancer  . Insomnia Mother   . Cancer Maternal Aunt     Cervical Cancer  . Cancer Paternal Aunt     Colon cancer  . Asthma Brother   . Diabetes Maternal Grandmother   . Diabetes Maternal Grandfather    Social Hx:   Social History  Substance Use Topics  . Smoking status: Former Research scientist (life sciences)  . Smokeless tobacco: Never Used  . Alcohol use No   Medication:   Reviewed, includes prozac. .    Allergies:  Patient has no known allergies.  Review of Systems: Gen:  Denies   fever, sweats, chills HEENT: Denies blurred vision, double vision. bleeds, sore throat Cvc:  No dizziness, chest pain. Resp:   Denies cough or sputum production, shortness of breath Gi: Denies swallowing difficulty, stomach pain. Gu:  Denies bladder incontinence, burning urine Ext:   No Joint pain, stiffness. Skin: No skin rash,  hives  Endoc:  No polyuria, polydipsia. Psych: No depression, insomnia. Other:  All other systems were reviewed with the patient and were negative other that what is mentioned in the HPI.   Physical Examination:   VS: BP 104/64 (BP Location: Left Arm, Patient Position: Sitting, Cuff Size: Normal)   Pulse 86   Resp 16   Ht 5\' 4"  (1.626 m)   Wt 216 lb (98 kg)   SpO2 96%   BMI 37.08 kg/m   General Appearance: No distress  Neuro:without focal findings,  speech normal,  HEENT: PERRLA, EOM intact.   Pulmonary: normal breath sounds, No wheezing.  CardiovascularNormal S1,S2.  No m/r/g.   Abdomen: Benign, Soft, non-tender. Renal:  No costovertebral tenderness  GU:  No performed at this time. Endoc: No evident thyromegaly, no signs of acromegaly. Skin:   warm, no rashes, no ecchymosis  Extremities: normal, no cyanosis, clubbing.  Other findings:  LABORATORY PANEL:   CBC No results for input(s): WBC, HGB, HCT, PLT in the last 168 hours. ------------------------------------------------------------------------------------------------------------------  Chemistries  No results for input(s): NA, K, CL, CO2, GLUCOSE, BUN, CREATININE, CALCIUM, MG, AST, ALT, ALKPHOS, BILITOT in the last 168 hours.  Invalid input(s): GFRCGP ------------------------------------------------------------------------------------------------------------------  Cardiac Enzymes No results for input(s): TROPONINI in the last 168 hours. ------------------------------------------------------------  RADIOLOGY:  No results found.     Thank  you for the consultation and for  allowing [facility] to assist in the care of your patient. Our recommendations are noted above.  Please contact us if we can be of further service.   Marda Stalker, MD.  Board Certified in Internal Medicine, Pulmonary Medicine, [place], and Sleep Medicine.  [facility] Office Number: [phone]  Patricia Pesa, M.D.  Merton Border, M.D  08/26/2016

## 2016-08-26 NOTE — Patient Instructions (Signed)
--

## 2016-09-05 ENCOUNTER — Ambulatory Visit: Payer: Medicaid Other | Attending: Internal Medicine

## 2016-09-05 DIAGNOSIS — E669 Obesity, unspecified: Secondary | ICD-10-CM | POA: Insufficient documentation

## 2016-09-05 DIAGNOSIS — G4710 Hypersomnia, unspecified: Secondary | ICD-10-CM | POA: Diagnosis present

## 2016-09-05 DIAGNOSIS — G4761 Periodic limb movement disorder: Secondary | ICD-10-CM | POA: Insufficient documentation

## 2016-09-17 ENCOUNTER — Encounter: Payer: Self-pay | Admitting: Obstetrics and Gynecology

## 2016-09-17 ENCOUNTER — Encounter: Payer: Medicaid Other | Admitting: Obstetrics and Gynecology

## 2016-09-17 ENCOUNTER — Other Ambulatory Visit: Payer: Self-pay | Admitting: Internal Medicine

## 2016-09-17 ENCOUNTER — Ambulatory Visit (INDEPENDENT_AMBULATORY_CARE_PROVIDER_SITE_OTHER): Payer: Medicaid Other | Admitting: Obstetrics and Gynecology

## 2016-09-17 VITALS — BP 118/70 | Wt 216.0 lb

## 2016-09-17 DIAGNOSIS — O99211 Obesity complicating pregnancy, first trimester: Secondary | ICD-10-CM

## 2016-09-17 DIAGNOSIS — O0991 Supervision of high risk pregnancy, unspecified, first trimester: Secondary | ICD-10-CM

## 2016-09-17 DIAGNOSIS — R5383 Other fatigue: Secondary | ICD-10-CM

## 2016-09-17 DIAGNOSIS — Z3A12 12 weeks gestation of pregnancy: Secondary | ICD-10-CM

## 2016-09-17 DIAGNOSIS — Z6837 Body mass index (BMI) 37.0-37.9, adult: Secondary | ICD-10-CM

## 2016-09-17 DIAGNOSIS — E042 Nontoxic multinodular goiter: Secondary | ICD-10-CM

## 2016-09-17 DIAGNOSIS — Z131 Encounter for screening for diabetes mellitus: Secondary | ICD-10-CM

## 2016-09-17 DIAGNOSIS — Z113 Encounter for screening for infections with a predominantly sexual mode of transmission: Secondary | ICD-10-CM

## 2016-09-17 NOTE — Progress Notes (Signed)
Patient aware of results of sleep study (negative). Patient has scheduled f/u and aware that she needs lab work.

## 2016-09-17 NOTE — Progress Notes (Signed)
New Obstetric Patient H&P    Chief Complaint: "Desires prenatal care"   History of Present Illness: Patient is a 31 y.o. [id number] Not Hispanic or Latino female, LMP 06/21/2016 presents with amenorrhea and positive home pregnancy test. Based on her  LMP, her EDD is Estimated Date of Delivery: 03/28/2017 and her EGA is [redacted]w[redacted]d. Cycles are 5. days, regular, and occur approximately every : 28 days. Her last pap smear was 2 months ago and was NILM, HPV negative.    She had a urine pregnancy test which was positive 2 week(s)  ago. Her last menstrual period was normal and lasted for  5 day(s). Since her LMP she claims she has experienced nausea (no emesis). She denies vaginal bleeding. Her past medical history is contibutory. Her prior pregnancies are notable for none  Since her LMP, she admits to the use of tobacco products  no She claims she has gained zero pounds since the start of her pregnancy.  There are cats in the home in the home  no  She admits close contact with children on a regular basis  yes  She has had chicken pox in the past yes She has had Tuberculosis exposures, symptoms, or previously tested positive for TB   no Current or past history of domestic violence. no  Genetic Screening/Teratology Counseling: (Includes patient, baby's father, or anyone in either family with:)   19. Patient's age >/= 68 [facility]  no 2. Thalassemia ([place], [place], [place], or Asian background): MCV<80  no 3. Neural tube defect (meningomyelocele, spina bifida, anencephaly)  no 4. Congenital heart defect  no  5. Down syndrome  no 6. Tay-Sachs (Jewish, [place])  no 7. Canavan's Disease  no 8. Sickle cell disease or trait (African)  no  9. Hemophilia or other blood disorders  no  10. Muscular dystrophy  no  11. Cystic fibrosis  no  12. Huntington's Chorea  no  13. Mental retardation/autism  no 14. Other inherited genetic or chromosomal disorder  no 15. Maternal metabolic disorder (DM,  PKU, etc)  no 16. Patient or FOB with a child with a birth defect not listed above no  16a. Patient or FOB with a birth defect themselves no 17. Recurrent pregnancy loss, or stillbirth  no  18. Any medications since LMP other than prenatal vitamins (include vitamins, supplements, OTC meds, drugs, alcohol)  no 19. Any other genetic/environmental exposure to discuss  no  Infection History:   1. Lives with someone with TB or TB exposed  no  2. Patient or partner has history of genital herpes  no 3. Rash or viral illness since LMP  no 4. History of STI (GC, CT, HPV, syphilis, HIV)  Chlamydia 4 years ago, treated.  5. History of recent travel :  no  Other pertinent information:  no   Review of Systems  Constitutional: Negative.   HENT: Negative.   Eyes: Negative.   Respiratory: Negative.   Cardiovascular: Negative.   Gastrointestinal: Negative.   Genitourinary: Negative.   Musculoskeletal: Negative.   Skin: Negative.   Neurological: Negative.   Psychiatric/Behavioral: Negative.      Past Medical History:  Diagnosis Date  . Anxiety   . CIN III (cervical intraepithelial neoplasia III)   . Depression   . Enlarged thyroid   . High risk HPV infection    followed by GYN    Past Surgical History:  Procedure Laterality Date  . CERVICAL BIOPSY  W/ LOOP ELECTRODE EXCISION    .  COLPOSCOPY    . NO PAST SURGERIES      Gynecologic History: Patient's last menstrual period was 06/21/2016 (lmp unknown).  Obstetric History: [id number] G1 2008 - SVD at term, uncomplicated G2 6568 - SVD at term, uncomplicated G3 1275 - EAB G4 2014 - SVD at term, uncomplicated G5 1700 - SVD at term, uncomplicated      Family History  Problem Relation Age of Onset  . Cancer Mother 23       colon cancer  . Insomnia Mother   . Cancer Maternal Aunt        Cervical Cancer  . Cancer Paternal Aunt        Colon cancer  . Asthma Brother   . Diabetes Maternal Grandmother   . Diabetes Maternal  Grandfather     Social History   Social History  . Marital status: Single    Spouse name: N/A  . Number of children: N/A  . Years of education: N/A   Occupational History  . Not on file.   Social History Main Topics  . Smoking status: Former Research scientist (life sciences)  . Smokeless tobacco: Never Used  . Alcohol use No  . Drug use: No  . Sexual activity: Yes    Birth control/ protection: None   Other Topics Concern  . Not on file   Social History Narrative  . No narrative on file    Allergies: No Known Allergies  Medications:   Medication Sig Start Date End Date Taking? Authorizing Provider  FLUoxetine (PROZAC) 20 MG capsule Take 3 capsules (60 mg total) by mouth daily. (for mood; stop escitalopram) 07/14/16   Lada, Satira Anis, MD  Multiple Vitamin (MULTIVITAMIN) tablet Take 1 tablet by mouth daily.    [provider]    Physical Exam BP 118/70   Wt 216 lb (98 kg)   LMP 06/21/2016 (LMP Unknown)   BMI 37.08 kg/m   Physical Exam  Constitutional: She is oriented to person, place, and time. She appears well-developed and well-nourished. No distress.  Eyes: EOM are normal. No scleral icterus.  Neck: Normal range of motion. Neck supple.  Cardiovascular: Normal rate and regular rhythm.   Pulmonary/Chest: Effort normal and breath sounds normal. No respiratory distress. She has no wheezes. She has no rales.  Abdominal: Soft. Bowel sounds are normal. She exhibits no distension and no mass. There is no tenderness. There is no rebound and no guarding.  Musculoskeletal: Normal range of motion. She exhibits no edema.  Neurological: She is alert and oriented to person, place, and time. No cranial nerve deficit.  Skin: Skin is warm and dry. No erythema.  Psychiatric: She has a normal mood and affect. Her behavior is normal. Judgment normal.    Assessment: 31 y.o. [id number] at [redacted]w[redacted]d GA presenting to initiate prenatal care  Plan: 1) Avoid alcoholic beverages. 2) Patient encouraged not to  smoke.  3) Discontinue the use of all non-medicinal drugs and chemicals.  4) Take prenatal vitamins daily.  5) Nutrition, food safety (fish, cheese advisories, and high nitrite foods) and exercise discussed. 6) Hospital and practice style discussed with cross coverage system.  7) Genetic Screening, such as with 1st Trimester Screening, cell free fetal DNA, AFP testing, and Ultrasound, as well as with amniocentesis and CVS as appropriate, is discussed with patient. At the conclusion of today's visit patient requested genetic testing 8) Patient is asked about travel to areas at risk for the Kemp virus, and counseled to avoid travel and exposure to mosquitoes or  sexual partners who may have themselves been exposed to the virus. Testing is discussed, and will be ordered as appropriate.   Prentice Docker, MD 09/17/2016 9:14 AM

## 2016-09-18 LAB — RPR+RH+ABO+RUB AB+AB SCR+CB...
Antibody Screen: NEGATIVE
HIV Screen 4th Generation wRfx: NONREACTIVE
Hematocrit: 39.5 % (ref 34.0–46.6)
Hemoglobin: 13.2 g/dL (ref 11.1–15.9)
Hepatitis B Surface Ag: NEGATIVE
MCH: 28.3 pg (ref 26.6–33.0)
MCHC: 33.4 g/dL (ref 31.5–35.7)
MCV: 85 fL (ref 79–97)
Platelets: 267 x10E3/uL (ref 150–379)
RBC: 4.67 x10E6/uL (ref 3.77–5.28)
RDW: 13.6 % (ref 12.3–15.4)
RPR Ser Ql: NONREACTIVE
Rh Factor: NEGATIVE
Rubella Antibodies, IGG: 1.01 {index}
Varicella zoster IgG: 316 {index}
WBC: 8.8 x10E3/uL (ref 3.4–10.8)

## 2016-09-18 LAB — TSH: TSH: 1.87 u[IU]/mL (ref 0.450–4.500)

## 2016-09-20 LAB — GC/CHLAMYDIA PROBE AMP
CHLAMYDIA, DNA PROBE: NEGATIVE
Neisseria gonorrhoeae by PCR: NEGATIVE

## 2016-09-25 ENCOUNTER — Other Ambulatory Visit: Payer: Medicaid Other

## 2016-09-25 ENCOUNTER — Telehealth: Payer: Self-pay | Admitting: Obstetrics & Gynecology

## 2016-09-25 ENCOUNTER — Ambulatory Visit (INDEPENDENT_AMBULATORY_CARE_PROVIDER_SITE_OTHER): Payer: Medicaid Other | Admitting: Obstetrics & Gynecology

## 2016-09-25 ENCOUNTER — Ambulatory Visit (INDEPENDENT_AMBULATORY_CARE_PROVIDER_SITE_OTHER): Payer: Medicaid Other

## 2016-09-25 VITALS — BP 120/70 | Wt 217.0 lb

## 2016-09-25 DIAGNOSIS — O99211 Obesity complicating pregnancy, first trimester: Secondary | ICD-10-CM

## 2016-09-25 DIAGNOSIS — O0991 Supervision of high risk pregnancy, unspecified, first trimester: Secondary | ICD-10-CM

## 2016-09-25 DIAGNOSIS — Z362 Encounter for other antenatal screening follow-up: Secondary | ICD-10-CM

## 2016-09-25 DIAGNOSIS — Z3A08 8 weeks gestation of pregnancy: Secondary | ICD-10-CM

## 2016-09-25 DIAGNOSIS — Z6837 Body mass index (BMI) 37.0-37.9, adult: Secondary | ICD-10-CM

## 2016-09-25 NOTE — Telephone Encounter (Signed)
Done, Pt aware through My Chart

## 2016-09-25 NOTE — Progress Notes (Signed)
[place] discussed, change EDC. Labs today, First screen desired nv

## 2016-09-25 NOTE — Telephone Encounter (Signed)
Pt is requesting a dentall cleaning clearance form to be faxed to her dentist or if she needs to pick it up. Please advise .[facility] Faxed # [phone]

## 2016-09-26 ENCOUNTER — Encounter: Payer: Self-pay | Admitting: Family Medicine

## 2016-09-26 ENCOUNTER — Ambulatory Visit (INDEPENDENT_AMBULATORY_CARE_PROVIDER_SITE_OTHER): Payer: Medicaid Other | Admitting: Family Medicine

## 2016-09-26 VITALS — BP 124/72 | HR 96 | Temp 97.9°F | Resp 16 | Wt 213.1 lb

## 2016-09-26 DIAGNOSIS — R21 Rash and other nonspecific skin eruption: Secondary | ICD-10-CM

## 2016-09-26 DIAGNOSIS — W57XXXA Bitten or stung by nonvenomous insect and other nonvenomous arthropods, initial encounter: Secondary | ICD-10-CM | POA: Diagnosis not present

## 2016-09-26 LAB — GLUCOSE, 1 HOUR GESTATIONAL: GESTATIONAL DIABETES SCREEN: 73 mg/dL (ref 65–139)

## 2016-09-26 NOTE — Progress Notes (Addendum)
Name: Alexandria [name]   MRN: [id number]    DOB: 08/09/1985   Date:09/26/2016       Progress Note  Subjective  Chief Complaint  Chief Complaint  Patient presents with  . Insect Bite    tick bite left breast underarm area, rash also, pt is [redacted] weeks pregnant, nausea and weak feeling                                                 HPI  Pt presents with 3 day history of tick bite and rash to left jaw line, right breast, and left upper abdomen; tick bit her on the left lateral breast. Rash is itchy - has been using cortisone cream with minimal relief. No fevers/chills, headaches, vision changes, body aches, night sweats.  Cannot recall coming into contact with poison ivy, no change is soaps/detergents/shampoos.  Has felt a little shaky and has some nausea, but states is unsure if this is related to being pregnant. Positive for diarrhea 2 days ago but is feeling a lot better today.  She has 4 small children at home, one still breastfeeding, and she is [redacted] weeks pregnant.    Patient Active Problem List   Diagnosis Date Noted  . Supervision of high risk pregnancy, antepartum, first trimester 09/17/2016  . [redacted] weeks gestation of pregnancy 09/17/2016  . Obesity complicating pregnancy, first trimester 09/17/2016  . BMI 37.0-37.9, adult 09/17/2016  . Obesity (BMI 35.0-39.9 without comorbidity) 07/14/2016  . Depression 07/11/2016  . Neoplasm of uncertain behavior of skin 05/26/2016  . Verruca vulgaris 10/18/2015  . Callus of foot 10/18/2015  . 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 Anxiety 10/06/2012  . CIN III (cervical intraepithelial neoplasia grade III) with severe dysplasia 08/06/2012    Social History  Substance Use Topics  . Smoking status: Former Research scientist (life sciences)  . Smokeless tobacco: Never Used  . Alcohol use No    Current Outpatient Prescriptions:  .  FLUoxetine (PROZAC) 20 MG capsule, Take 3 capsules (60 mg total) by mouth daily. (for mood; stop  escitalopram), Disp: 90 capsule, Rfl: 6 .  Multiple Vitamin (MULTIVITAMIN) tablet, Take 1 tablet by mouth daily., Disp: , Rfl:   No Known Allergies  ROS  Ten systems reviewed and is negative except as mentioned in HPI  Objective  Vitals:   09/26/16 1322  BP: 124/72  Pulse: (!) 110  Resp: 16  Temp: 97.9 F (36.6 C)  SpO2: 97%  Weight: 213 lb 2 oz (96.7 kg)    Body mass index is 36.58 kg/m.  Nursing Note and Vital Signs reviewed.  Physical Exam  Constitutional: Patient appears well-developed and well-nourished. Obese No distress.  HEENT: head atraumatic, normocephalic, pupils equal and reactive to light, EOM's intact, neck supple without lymphadenopathy Cardiovascular: Normal rate, regular rhythm, S1/S2 present.  No murmur or rub heard. No BLE edema. Pulmonary/Chest: Effort normal and breath sounds clear. No respiratory distress or retractions. Psychiatric: Patient has a normal mood and affect. behavior is normal. Judgment and thought content normal. Skin: Erythematous macular rash to right medial breast, left upper quadrant of abdomen, and small amount to left jaw line. Left lateral breast has 1cm diameter area of erythema where pt states she found the tick.  Recent Results (from the past 2160 hour(s))  IGP, Aptima HPV, rfx 16/18,45  Status: None   Collection Time: 07/11/16 12:15 PM  Result Value Ref Range   DIAGNOSIS: Comment     Comment: NEGATIVE FOR INTRAEPITHELIAL LESION AND MALIGNANCY. THIS SPECIMEN WAS RESCREENED AS PART OF OUR QUALITY CONTROL PROGRAM.    Specimen adequacy: Comment     Comment: Satisfactory for evaluation. Endocervical and/or squamous metaplastic cells (endocervical component) are present.    CLINICIAN PROVIDED ICD10: Comment     Comment: R87.610 D06.9    Performed by: Comment     Comment: Rivka Barbara, Cytotechnologist (ASCP)   QC reviewed by: Comment     Comment: Cathlyn Parsons, Supervisory Cytotechnologist (ASCP)   PAP Smear Comment .     Note: Comment     Comment: The Pap smear is a screening test designed to aid in the detection of premalignant and malignant conditions of the uterine cervix.  It is not a diagnostic procedure and should not be used as the sole means of detecting cervical cancer.  Both false-positive and false-negative reports do occur.    Test Methodology Comment     Comment: This liquid based ThinPrep(R) pap test was screened with the use of an image guided system.    HPV Aptima Negative Negative    Comment: This test detects fourteen high-risk HPV types (16/18/31/33/35/39/45/ 51/52/56/58/59/66/68) without differentiation.   GC/Chlamydia Probe Amp     Status: None   Collection Time: 09/17/16  8:37 AM  Result Value Ref Range   Chlamydia trachomatis, NAA Negative Negative   Neisseria gonorrhoeae by PCR Negative Negative  RPR+Rh+ABO+Rub Ab+Ab Scr+CB...     Status: None   Collection Time: 09/17/16  8:51 AM  Result Value Ref Range   HIV Screen 4th Generation wRfx Non Reactive Non Reactive   Varicella zoster IgG 316 Immune >165 index    Comment:                                Negative          <135                                Equivocal    135 - 165                                Positive          >165 A positive result generally indicates exposure to the pathogen or administration of specific immunoglobulins, but it is not indication of active infection or stage of disease.    Hepatitis B Surface Ag Negative Negative   RPR Ser Ql Non Reactive Non Reactive   Rubella Antibodies, IGG 1.01 Immune >0.99 index    Comment:                                 Non-immune       <0.90                                 Equivocal  0.90 - 0.99                                 Immune           >  0.99    ABO Grouping A    Rh Factor Negative     Comment: Please note: Prior records for this patient's ABO / Rh type are not available for additional verification.    Antibody Screen Negative Negative   WBC 8.8 3.4 -  10.8 x10E3/uL   RBC 4.67 3.77 - 5.28 x10E6/uL   Hemoglobin 13.2 11.1 - 15.9 g/dL   Hematocrit 39.5 34.0 - 46.6 %   MCV 85 79 - 97 fL   MCH 28.3 26.6 - 33.0 pg   MCHC 33.4 31.5 - 35.7 g/dL   RDW 13.6 12.3 - 15.4 %   Platelets 267 150 - 379 x10E3/uL  TSH     Status: None   Collection Time: 09/17/16  8:51 AM  Result Value Ref Range   TSH 1.870 0.450 - 4.500 uIU/mL  Glucose, 1 hour gestational     Status: None   Collection Time: 09/25/16  3:07 PM  Result Value Ref Range   Gestational Diabetes Screen 73 65 - 139 mg/dL    Comment: According to ADA, a glucose threshold of >139 mg/dL after 50-gram load identifies approximately 80% of women with gestational diabetes mellitus, while the sensitivity is further increased to approximately 90% by a threshold of >129 mg/dL.      Assessment & Plan 1. Rash Discussed OTC preparations that are safe to use during pregnancy including low dose Benadryl (12.5mg ) once daily PRN for itching, calamine lotion, and colloidal oatmeal baths.  2. Tick bite, initial encounter Discussed patient's signs and symptoms at length, and reviewed red flags on her AVS in detail. Advised that Doxycycline is not safe during pregnancy, so prophylactic therapy is not recommended at this time. Pt will call if symptoms worsen or fail to improve with monitoring and the aforementioned dermatologic interventions.  -Red flags and when to present for emergency care or RTC including fever >101.73F, chest pain, shortness of breath, new/worsening/un-resolving symptoms, and tick-bite related symptoms in AVS reviewed with patient at time of visit. Follow up and care instructions discussed and provided in AVS.  ------------------------- I have reviewed this encounter including the documentation in this note and/or discussed this patient with the Johney Maine, FNP, NP-C. I am certifying that I agree with the content of this note as supervising physician.  Enid Derry,  [facility] 09/26/2016, 5:39 PM

## 2016-09-26 NOTE — Patient Instructions (Addendum)
Colloidal Oatmeal Baths Calamine Lotion  Tick Bite Information Introduction Ticks are insects that attach themselves to the skin. There are many types of ticks. Common types include wood ticks and deer ticks. Sometimes, ticks carry diseases that can make a person very ill. The most common places for ticks to attach themselves are the scalp, neck, armpits, waist, and groin. HOW CAN YOU PREVENT TICK BITES? Take these steps to help prevent tick bites when you are outdoors:  Wear long sleeves and long pants.  Wear white clothes so you can see ticks more easily.  Tuck your pant legs into your socks.  If walking on a trail, stay in the middle of the trail to avoid brushing against bushes.  Avoid walking through areas with long grass.  Put bug spray on all skin that is showing and along boot tops, pant legs, and sleeve cuffs.  Check clothes, hair, and skin often and before going inside.  Brush off any ticks that are not attached.  Take a shower or bath as soon as possible after being outdoors.  HOW SHOULD YOU REMOVE A TICK? Ticks should be removed as soon as possible to help prevent diseases. 1. If latex gloves are available, put them on before trying to remove a tick. 2. Use tweezers to grasp the tick as close to the skin as possible. You may also use curved forceps or a tick removal tool. Grasp the tick as close to its head as possible. Avoid grasping the tick on its body. 3. Pull gently upward until the tick lets go. Do not twist the tick or jerk it suddenly. This may break off the tick's head or mouth parts. 4. Do not squeeze or crush the tick's body. This could force disease-carrying fluids from the tick into your body. 5. After the tick is removed, wash the bite area and your hands with soap and water or alcohol. 6. Apply a small amount of antiseptic cream or ointment to the bite site. 7. Wash any tools that were used.  Do not try to remove a tick by applying a hot match,  petroleum jelly, or fingernail polish to the tick. These methods do not work. They may also increase the chances of disease being spread from the tick bite. WHEN SHOULD YOU SEEK HELP? Contact your health care provider if you are unable to remove a tick or if a part of the tick breaks off in the skin. After a tick bite, you need to watch for signs and symptoms of diseases that can be spread by ticks. Contact your health care provider if you develop any of the following:  Fever.  Rash.  Redness and puffiness (swelling) in the area of the tick bite.  Tender, puffy lymph glands.  Watery poop (diarrhea).  Weight loss.  Cough.  Feeling more tired than normal (fatigue).  Muscle, joint, or bone pain.  Belly (abdominal) pain.  Headache.  Change in your level of consciousness.  Trouble walking or moving your legs.  Loss of feeling (numbness) in the legs.  Loss of movement (paralysis).  Shortness of breath.  Confusion.  Throwing up (vomiting) many times.  This information is not intended to replace advice given to you by your health care provider. Make sure you discuss any questions you have with your health care provider. Document Released: 07/09/2009 Document Revised: 09/20/2015 Document Reviewed: 09/22/2012 Elsevier Interactive Patient Education  [employer].

## 2016-10-06 ENCOUNTER — Telehealth: Payer: Self-pay

## 2016-10-06 ENCOUNTER — Encounter: Payer: Self-pay | Admitting: Family Medicine

## 2016-10-06 DIAGNOSIS — B3789 Other sites of candidiasis: Secondary | ICD-10-CM

## 2016-10-06 MED ORDER — MICONAZOLE NITRATE 2 % EX CREA: 1.0000 "application " | TOPICAL_CREAM | Freq: Two times a day (BID) | CUTANEOUS | 0 refills | Status: DC

## 2016-10-06 MED ORDER — CLOTRIMAZOLE 1 % EX CREA: 1.0000 "application " | TOPICAL_CREAM | Freq: Two times a day (BID) | CUTANEOUS | 0 refills | Status: DC

## 2016-10-06 NOTE — Telephone Encounter (Signed)
Switched to Clotrimazole. Same instructions. Thank you!

## 2016-10-06 NOTE — Telephone Encounter (Signed)
I have sent in miconazole topical.  Please tell her to apply twice a day for two weeks. She needs to remove any visible residue of this cream using coconut or olive oil before breastfeeding.  Drinking apple Cider vinegar typically does not have a benefit for this. Does she need anything for the vaginal yeast infection? Thank you!

## 2016-10-06 NOTE — Telephone Encounter (Signed)
When I called patient to notify she states the pharmacy has already contacted her this med is not covered by medicaid.

## 2016-10-06 NOTE — Telephone Encounter (Signed)
Patient has called back and has figured out what the rash is on her breast.  She states it is yeast.  She has thrush, and has had a vaginal yeast infection and her baby has also had thrush who breast feeds. Wants to know if she can get something for this?  She said that she has been taking apple cider vinegar does this help? Please advise

## 2016-10-07 NOTE — Telephone Encounter (Signed)
Left voice mail

## 2016-10-10 ENCOUNTER — Telehealth: Payer: Self-pay | Admitting: Family Medicine

## 2016-10-10 NOTE — Telephone Encounter (Signed)
I don't see anything about headaches in her chart We'll need to see her for this if she is having headaches Thank you

## 2016-10-10 NOTE — Telephone Encounter (Signed)
Pt called back and stated that the referral is not for routine check up, that it should states that it is for her having headaches. Please include the NPI as well. Please fax to [phone]

## 2016-10-10 NOTE — Telephone Encounter (Signed)
Pt has [employer] and is needing a referral to go to [facility] in [place] for her annual eye exam. Please contact patient once this has been taken care of.

## 2016-10-10 NOTE — Telephone Encounter (Signed)
Already has an appt

## 2016-10-12 NOTE — Progress Notes (Deleted)
  [facility] Consultation      Assessment and Plan:  Excessive daytime sleepiness.  --Will send for sleep study.   Morning headaches.  Obesity.  -Weight loss may be beneficial  Nicotine abuse.  --Recently quit smoking, about 3 months.   Depression. -Currently on fluoxetine, which may cause daytime sleepiness and cause REM sleep suppression, contributing to daytime sleepiness.   Date: 10/12/2016  MRN# [id number] Alexandria [name] 27-Aug-1985  Referring Physician: Dr. lada for snoring.   Alexandria [name] is a 32 y.o. old female seen in consultation for chief complaint of:    No chief complaint on file.   HPI:   She has been feeling tired, she goes to bed between 9 to 11 pm, usually wakes up between 6 and 7 am.  When wakes in am, feels tired, but this has been better since quitting smoking.   She has been snoring and and boyfriend has noted that she has been stopping breathing in her sleep. She was sent for sleep study which did not so obstructive sleep apnea. Periodic limb movements in sleep were seen with a PLM index of 14.7.Marland Kitchen These were were not associated with arousals. She did have a spontaneous arousal index of 5.  Medication:    Allergies:  Patient has no known allergies.  Review of Systems: Gen:  Denies  fever, sweats, chills HEENT: Denies blurred vision, double vision. bleeds, sore throat Cvc:  No dizziness, chest pain. Resp:   Denies cough or sputum production, shortness of breath Gi: Denies swallowing difficulty, stomach pain. Gu:  Denies bladder incontinence, burning urine Ext:   No Joint pain, stiffness. Skin: No skin rash,  hives  Endoc:  No polyuria, polydipsia. Psych: No depression, insomnia. Other:  All other systems were reviewed with the patient and were negative other that what is mentioned in the HPI.   Physical Examination:   VS: LMP 06/21/2016 (LMP Unknown)   General Appearance: No distress  Neuro:without focal findings,   speech normal,  HEENT: PERRLA, EOM intact.   Pulmonary: normal breath sounds, No wheezing.  CardiovascularNormal S1,S2.  No m/r/g.   Abdomen: Benign, Soft, non-tender. Renal:  No costovertebral tenderness  GU:  No performed at this time. Endoc: No evident thyromegaly, no signs of acromegaly. Skin:   warm, no rashes, no ecchymosis  Extremities: normal, no cyanosis, clubbing.  Other findings:    LABORATORY PANEL:   CBC No results for input(s): WBC, HGB, HCT, PLT in the last 168 hours. ------------------------------------------------------------------------------------------------------------------  Chemistries  No results for input(s): NA, K, CL, CO2, GLUCOSE, BUN, CREATININE, CALCIUM, MG, AST, ALT, ALKPHOS, BILITOT in the last 168 hours.  Invalid input(s): GFRCGP ------------------------------------------------------------------------------------------------------------------  Cardiac Enzymes No results for input(s): TROPONINI in the last 168 hours. ------------------------------------------------------------  RADIOLOGY:  No results found.     Thank  you for the consultation and for allowing [facility] to assist in the care of your patient. Our recommendations are noted above.  Please contact us if we can be of further service.   Marda Stalker, MD.  Board Certified in Internal Medicine, Pulmonary Medicine, [place], and Sleep Medicine.  [facility] Office Number: [phone]  Patricia Pesa, M.D.  Merton Border, M.D  10/12/2016

## 2016-10-13 ENCOUNTER — Ambulatory Visit: Payer: Medicaid Other | Admitting: Family Medicine

## 2016-10-15 ENCOUNTER — Ambulatory Visit: Payer: Medicaid Other | Admitting: Internal Medicine

## 2016-10-15 ENCOUNTER — Encounter: Payer: Self-pay | Admitting: Family Medicine

## 2016-10-15 ENCOUNTER — Telehealth: Payer: Self-pay | Admitting: Family Medicine

## 2016-10-15 ENCOUNTER — Ambulatory Visit (INDEPENDENT_AMBULATORY_CARE_PROVIDER_SITE_OTHER): Payer: Medicaid Other | Admitting: Family Medicine

## 2016-10-15 VITALS — BP 110/74 | HR 97 | Temp 97.7°F | Resp 14 | Wt 212.6 lb

## 2016-10-15 DIAGNOSIS — R519 Headache, unspecified: Secondary | ICD-10-CM

## 2016-10-15 DIAGNOSIS — D485 Neoplasm of uncertain behavior of skin: Secondary | ICD-10-CM

## 2016-10-15 DIAGNOSIS — H5213 Myopia, bilateral: Secondary | ICD-10-CM

## 2016-10-15 DIAGNOSIS — E042 Nontoxic multinodular goiter: Secondary | ICD-10-CM | POA: Diagnosis not present

## 2016-10-15 DIAGNOSIS — R51 Headache: Secondary | ICD-10-CM

## 2016-10-15 DIAGNOSIS — R21 Rash and other nonspecific skin eruption: Secondary | ICD-10-CM

## 2016-10-15 NOTE — Assessment & Plan Note (Signed)
Monitored by specialist 

## 2016-10-15 NOTE — Telephone Encounter (Signed)
Pt forgot to mention that the referral to [facility] has to have Dr Sanda Klein NPI number on it.

## 2016-10-15 NOTE — Assessment & Plan Note (Signed)
Left forearm; will see dermatologist if comes back and for periodic monitoring

## 2016-10-15 NOTE — Progress Notes (Signed)
BP 110/74   Pulse 97   Temp 97.7 F (36.5 C) (Oral)   Resp 14   Wt 212 lb 9.6 oz (96.4 kg)   LMP 06/21/2016 (LMP Unknown)   SpO2 98%   Breastfeeding? Unknown   BMI 36.49 kg/m    Subjective:    Patient ID: Alexandria [name], female    DOB: 05-18-1985, 31 y.o.   MRN: [id number]  HPI: Alexandria [name] is a 31 y.o. female  Chief Complaint  Patient presents with  . Referral    to eye which may be causing the headaches due to glasses being broke    HPI  Patient is here to get a referral She has been having headaches; she does not think it's related to her pregnancy or dehydration She recently stopped wearing her glasses because they broke She needs them to see far away; she squints a lot more without glasses Has worn glasses just a few years Just vision issue; otherwise eyes are healthy Patient believes that she her headaches are due to needing glasses [facility] in [place]  [redacted] weeks gestation; some nausea, not terrible; no abnormal bleeding; going to [facility]; rotating through  Had elevated RBC and back to normal; has restless leg syndrome; no sleep apnea; had gestational diabetes testing rescheduled, will get iron checked too by OB; had low iron with other pregnancies  Thyroid levels monitored by specialist  Depression screen [facility] 2/9 10/15/2016 07/14/2016 05/26/2016 10/18/2015 10/13/2014  Decreased Interest 0 0 0 0 0  Down, Depressed, Hopeless 0 0 1 0 0  PHQ - 2 Score 0 0 1 0 0  Altered sleeping - - - - -  Tired, decreased energy - - - - -  Change in appetite - - - - -  Feeling bad or failure about yourself  - - - - -  Trouble concentrating - - - - -  Moving slowly or fidgety/restless - - - - -  Suicidal thoughts - - - - -  PHQ-9 Score - - - - -    Relevant past medical, surgical, family and social history reviewed Past Medical History:  Diagnosis Date  . Anxiety   . CIN III (cervical intraepithelial neoplasia III)   . Depression   . Enlarged  thyroid   . High risk HPV infection    followed by GYN   Past Surgical History:  Procedure Laterality Date  . CERVICAL BIOPSY  W/ LOOP ELECTRODE EXCISION    . COLPOSCOPY    . NO PAST SURGERIES     Family History  Problem Relation Age of Onset  . Cancer Mother 88       colon cancer  . Insomnia Mother   . Cancer Maternal Aunt        Cervical Cancer  . Cancer Paternal Aunt        Colon cancer  . Asthma Brother   . Diabetes Maternal Grandmother   . Diabetes Maternal Grandfather    Social History   Social History  . Marital status: Single    Spouse name: N/A  . Number of children: N/A  . Years of education: N/A   Occupational History  . Not on file.   Social History Main Topics  . Smoking status: Former Research scientist (life sciences)  . Smokeless tobacco: Never Used  . Alcohol use No  . Drug use: No  . Sexual activity: Yes    Birth control/ protection: None   Other Topics Concern  . Not  on file   Social History Narrative  . No narrative on file    Interim medical history since last visit reviewed. Allergies and medications reviewed  Review of Systems Per HPI unless specifically indicated above     Objective:    BP 110/74   Pulse 97   Temp 97.7 F (36.5 C) (Oral)   Resp 14   Wt 212 lb 9.6 oz (96.4 kg)   LMP 06/21/2016 (LMP Unknown)   SpO2 98%   Breastfeeding? Unknown   BMI 36.49 kg/m   Wt Readings from Last 3 Encounters:  10/15/16 212 lb 9.6 oz (96.4 kg)  09/26/16 213 lb 2 oz (96.7 kg)  09/25/16 217 lb (98.4 kg)    Physical Exam  Constitutional: She appears well-developed and well-nourished.  HENT:  Right Ear: Hearing, tympanic membrane, external ear and ear canal normal. Tympanic membrane is not injected and not erythematous. No middle ear effusion.  Left Ear: Hearing, tympanic membrane, external ear and ear canal normal. Tympanic membrane is not injected and not erythematous.  No middle ear effusion.  Nose: No mucosal edema or rhinorrhea.  Mouth/Throat: Oropharynx  is clear and moist and mucous membranes are normal.  Eyes: EOM are normal. No scleral icterus.  Cardiovascular: Normal rate and regular rhythm.   Pulmonary/Chest: Effort normal.  Neurological: She is alert.  No facial asymmetry  Skin: Rash (arms, legs; mildly erythematous; different sizes/shapes) noted. No bruising, no ecchymosis and no petechiae noted. No pallor.  Scar on the LEFT forearm, no dark pigmentation noted  Psychiatric: She has a normal mood and affect. Her behavior is normal.    Results for orders placed or performed in visit on 09/17/16  GC/Chlamydia Probe Amp  Result Value Ref Range   Chlamydia trachomatis, NAA Negative Negative   Neisseria gonorrhoeae by PCR Negative Negative  Glucose, 1 hour gestational  Result Value Ref Range   Gestational Diabetes Screen 73 65 - 139 mg/dL  RPR+Rh+ABO+Rub Ab+Ab Scr+CB...  Result Value Ref Range   HIV Screen 4th Generation wRfx Non Reactive Non Reactive   Varicella zoster IgG 316 Immune >165 index   Hepatitis B Surface Ag Negative Negative   RPR Ser Ql Non Reactive Non Reactive   Rubella Antibodies, IGG 1.01 Immune >0.99 index   ABO Grouping A    Rh Factor Negative    Antibody Screen Negative Negative   WBC 8.8 3.4 - 10.8 x10E3/uL   RBC 4.67 3.77 - 5.28 x10E6/uL   Hemoglobin 13.2 11.1 - 15.9 g/dL   Hematocrit 39.5 34.0 - 46.6 %   MCV 85 79 - 97 fL   MCH 28.3 26.6 - 33.0 pg   MCHC 33.4 31.5 - 35.7 g/dL   RDW 13.6 12.3 - 15.4 %   Platelets 267 150 - 379 x10E3/uL  TSH  Result Value Ref Range   TSH 1.870 0.450 - 4.500 uIU/mL      Assessment & Plan:   Problem List Items Addressed This Visit      Endocrine   Goiter, nontoxic, multinodular    Monitored by specialist        Musculoskeletal and Integument   Neoplasm of uncertain behavior of skin    Left forearm; will see dermatologist if comes back and for periodic monitoring       Other Visit Diagnoses    Persistent headaches    -  Primary   Relevant Orders    Ambulatory referral to Optometry   Near sighted, bilateral  Relevant Orders   Ambulatory referral to Optometry   Rash in adult       cannot r/o PUPPP, so patient advised to see OB       Follow up plan: No Follow-up on file.  An after-visit summary was printed and given to the patient at [facility].  Please see the patient instructions which may contain other information and recommendations beyond what is mentioned above in the assessment and plan.  Meds ordered this encounter  Medications  . Prenatal Multivit-Min-Fe-FA (PRENATAL VITAMINS PO)    Sig: Take by mouth.  . ferrous sulfate 325 (65 FE) MG tablet    Sig: Take 1 tablet (325 mg total) by mouth daily with breakfast.    Refill:  0    Orders Placed This Encounter  Procedures  . Ambulatory referral to Optometry

## 2016-10-15 NOTE — Patient Instructions (Signed)
We'll have you see the eye doctor Do stay hydrated, especially on these hot days Keep your follow-up with your OB doctor, and ask about your rash

## 2016-10-16 NOTE — Telephone Encounter (Signed)
Information has been attached to the referral.

## 2016-10-19 NOTE — Progress Notes (Deleted)
  [facility] Consultation      Assessment and Plan:  Excessive daytime sleepiness.  --Will send for sleep study.   Morning headaches.  Obesity.  -Weight loss may be beneficial  Nicotine abuse.  --Recently quit smoking, about 3 months.   Depression. -Currently on fluoxetine, which may cause daytime sleepiness and cause REM sleep suppression, contributing to daytime sleepiness.   Date: 10/19/2016  MRN# [id number] Alexandria [name] January 30, 1986  Referring Physician: Dr. lada for snoring.   Alexandria [name] is a 31 y.o. old female seen in consultation for chief complaint of:    No chief complaint on file.   HPI:   She has been feeling tired, she goes to bed between 9 to 11 pm, usually wakes up between 6 and 7 am.  When wakes in am, feels tired, but this has been better since quitting smoking.   She has been snoring and and boyfriend has noted that she has been stopping breathing in her sleep. She was sent for sleep study which did not so obstructive sleep apnea. Periodic limb movements in sleep were seen with a PLM index of 14.7.Marland Kitchen These were were not associated with arousals. She did have a spontaneous arousal index of 5.  Medication:    Allergies:  Patient has no known allergies.  Review of Systems: Gen:  Denies  fever, sweats, chills HEENT: Denies blurred vision, double vision. bleeds, sore throat Cvc:  No dizziness, chest pain. Resp:   Denies cough or sputum production, shortness of breath Gi: Denies swallowing difficulty, stomach pain. Gu:  Denies bladder incontinence, burning urine Ext:   No Joint pain, stiffness. Skin: No skin rash,  hives  Endoc:  No polyuria, polydipsia. Psych: No depression, insomnia. Other:  All other systems were reviewed with the patient and were negative other that what is mentioned in the HPI.   Physical Examination:   VS: LMP 06/21/2016 (LMP Unknown)   General Appearance: No distress  Neuro:without focal findings,   speech normal,  HEENT: PERRLA, EOM intact.   Pulmonary: normal breath sounds, No wheezing.  CardiovascularNormal S1,S2.  No m/r/g.   Abdomen: Benign, Soft, non-tender. Renal:  No costovertebral tenderness  GU:  No performed at this time. Endoc: No evident thyromegaly, no signs of acromegaly. Skin:   warm, no rashes, no ecchymosis  Extremities: normal, no cyanosis, clubbing.  Other findings:    LABORATORY PANEL:   CBC No results for input(s): WBC, HGB, HCT, PLT in the last 168 hours. ------------------------------------------------------------------------------------------------------------------  Chemistries  No results for input(s): NA, K, CL, CO2, GLUCOSE, BUN, CREATININE, CALCIUM, MG, AST, ALT, ALKPHOS, BILITOT in the last 168 hours.  Invalid input(s): GFRCGP ------------------------------------------------------------------------------------------------------------------  Cardiac Enzymes No results for input(s): TROPONINI in the last 168 hours. ------------------------------------------------------------  RADIOLOGY:  No results found.     Thank  you for the consultation and for allowing [facility] to assist in the care of your patient. Our recommendations are noted above.  Please contact us if we can be of further service.   Marda Stalker, MD.  Board Certified in Internal Medicine, Pulmonary Medicine, [place], and Sleep Medicine.  [facility] Office Number: [phone]  Patricia Pesa, M.D.  Merton Border, M.D  10/19/2016

## 2016-10-20 ENCOUNTER — Ambulatory Visit: Payer: Medicaid Other | Admitting: Internal Medicine

## 2016-10-23 ENCOUNTER — Encounter: Payer: Medicaid Other | Admitting: Advanced Practice Midwife

## 2016-10-23 ENCOUNTER — Other Ambulatory Visit: Payer: Medicaid Other

## 2016-11-04 ENCOUNTER — Encounter: Payer: Self-pay | Admitting: Advanced Practice Midwife

## 2016-11-04 ENCOUNTER — Ambulatory Visit: Payer: Medicaid Other

## 2016-11-04 ENCOUNTER — Ambulatory Visit (INDEPENDENT_AMBULATORY_CARE_PROVIDER_SITE_OTHER): Payer: Medicaid Other | Admitting: Advanced Practice Midwife

## 2016-11-04 VITALS — BP 106/72 | Wt 219.0 lb

## 2016-11-04 DIAGNOSIS — Z3A14 14 weeks gestation of pregnancy: Secondary | ICD-10-CM

## 2016-11-04 NOTE — Progress Notes (Signed)
Having some nausea but denies need for medications. Discussed h/a triggers- patient admits lack of sleep. She has an appointment with a sleep dr.

## 2016-11-04 NOTE — Progress Notes (Signed)
Nausea Headaches/Per Cyril Mourning, to far along for first trimester screen

## 2016-11-05 ENCOUNTER — Encounter: Payer: Self-pay | Admitting: Internal Medicine

## 2016-11-05 DIAGNOSIS — G4719 Other hypersomnia: Secondary | ICD-10-CM

## 2016-11-25 NOTE — Progress Notes (Deleted)
[facility] Consultation      Assessment and Plan:  Excessive daytime sleepiness.  --Sleep study did not show evidence of significant sleep apnea. -There was evidence of spontaneous arousal index of 5 per hour, likely related to insomnia. -PLMS was noted without significant arousals.  Periodic limb movements in sleep. -Check serum ferritin level, if less than 45 can treat with iron supplementation.  Insomnia. -As evidenced by spontaneous arousal index of 5 per hour and seen on sleep study. -Discussed a trial of sedative medication to see if this helps with daytime sleepiness.  Obesity.  -Weight loss may be beneficial  Nicotine abuse.  --Recently quit smoking, about 3 months.   Depression. -Currently on fluoxetine, which may cause daytime sleepiness and cause REM sleep suppression, contributing to daytime sleepiness.   Date: 11/25/2016  MRN# [id number] Alexandria [name] Oct 31, 1985  Referring Physician: Dr. lada for snoring.   Alexandria [name] is a 31 y.o. old female seen in consultation for chief complaint of:    No chief complaint on file.   HPI:   She has been feeling tired, she goes to bed between 9 to 11 pm, usually wakes up between 6 and 7 am.  When wakes in am, feels tired, but this has been better since quitting smoking.   She has been snoring and and boyfriend has noted that she has been stopping breathing in her sleep.     Medication:   Reviewed, includes prozac. .    Allergies:  Patient has no known allergies.  Review of Systems: Gen:  Denies  fever, sweats, chills HEENT: Denies blurred vision, double vision. bleeds, sore throat Cvc:  No dizziness, chest pain. Resp:   Denies cough or sputum production, shortness of breath Gi: Denies swallowing difficulty, stomach pain. Gu:  Denies bladder incontinence, burning urine Ext:   No Joint pain, stiffness. Skin: No skin rash,  hives  Endoc:  No polyuria, polydipsia. Psych: No  depression, insomnia. Other:  All other systems were reviewed with the patient and were negative other that what is mentioned in the HPI.   Physical Examination:   VS: LMP 06/21/2016 (LMP Unknown)   General Appearance: No distress  Neuro:without focal findings,  speech normal,  HEENT: PERRLA, EOM intact.   Pulmonary: normal breath sounds, No wheezing.  CardiovascularNormal S1,S2.  No m/r/g.   Abdomen: Benign, Soft, non-tender. Renal:  No costovertebral tenderness  GU:  No performed at this time. Endoc: No evident thyromegaly, no signs of acromegaly. Skin:   warm, no rashes, no ecchymosis  Extremities: normal, no cyanosis, clubbing.  Other findings:    LABORATORY PANEL:   CBC No results for input(s): WBC, HGB, HCT, PLT in the last 168 hours. ------------------------------------------------------------------------------------------------------------------  Chemistries  No results for input(s): NA, K, CL, CO2, GLUCOSE, BUN, CREATININE, CALCIUM, MG, AST, ALT, ALKPHOS, BILITOT in the last 168 hours.  Invalid input(s): GFRCGP ------------------------------------------------------------------------------------------------------------------  Cardiac Enzymes No results for input(s): TROPONINI in the last 168 hours. ------------------------------------------------------------  RADIOLOGY:  No results found.     Thank  you for the consultation and for allowing [facility] to assist in the care of your patient. Our recommendations are noted above.  Please contact us if we can be of further service.   Marda Stalker, MD.  Board Certified in Internal Medicine, Pulmonary Medicine, [place], and Sleep Medicine.  [facility] Office Number: [phone]  Patricia Pesa, M.D.  Merton Border, M.D  11/25/2016

## 2016-11-27 ENCOUNTER — Ambulatory Visit: Payer: Medicaid Other | Admitting: Internal Medicine

## 2016-11-27 ENCOUNTER — Emergency Department
Admission: EM | Admit: 2016-11-27 | Discharge: 2016-11-27 | Disposition: A | Discharge status: Home / Self Care | Payer: Medicaid Other | Attending: Emergency Medicine | Admitting: Emergency Medicine

## 2016-11-27 ENCOUNTER — Ambulatory Visit: Payer: Medicaid Other | Admitting: Certified Nurse Midwife

## 2016-11-27 ENCOUNTER — Encounter: Payer: Self-pay | Admitting: Emergency Medicine

## 2016-11-27 DIAGNOSIS — R519 Headache, unspecified: Secondary | ICD-10-CM

## 2016-11-27 DIAGNOSIS — O9989 Other specified diseases and conditions complicating pregnancy, childbirth and the puerperium: Secondary | ICD-10-CM | POA: Diagnosis not present

## 2016-11-27 DIAGNOSIS — Z87891 Personal history of nicotine dependence: Secondary | ICD-10-CM | POA: Insufficient documentation

## 2016-11-27 DIAGNOSIS — O2342 Unspecified infection of urinary tract in pregnancy, second trimester: Secondary | ICD-10-CM | POA: Insufficient documentation

## 2016-11-27 DIAGNOSIS — Z3A17 17 weeks gestation of pregnancy: Secondary | ICD-10-CM | POA: Diagnosis not present

## 2016-11-27 DIAGNOSIS — R51 Headache: Secondary | ICD-10-CM | POA: Diagnosis present

## 2016-11-27 DIAGNOSIS — Z79899 Other long term (current) drug therapy: Secondary | ICD-10-CM | POA: Insufficient documentation

## 2016-11-27 DIAGNOSIS — J0110 Acute frontal sinusitis, unspecified: Secondary | ICD-10-CM | POA: Insufficient documentation

## 2016-11-27 DIAGNOSIS — O219 Vomiting of pregnancy, unspecified: Secondary | ICD-10-CM | POA: Insufficient documentation

## 2016-11-27 LAB — COMPREHENSIVE METABOLIC PANEL
ALT: 11 U/L — ABNORMAL LOW (ref 14–54)
ANION GAP: 8 (ref 5–15)
AST: 15 U/L (ref 15–41)
Albumin: 3.4 g/dL — ABNORMAL LOW (ref 3.5–5.0)
Alkaline Phosphatase: 60 U/L (ref 38–126)
BILIRUBIN TOTAL: 0.4 mg/dL (ref 0.3–1.2)
CHLORIDE: 103 mmol/L (ref 101–111)
CO2: 24 mmol/L (ref 22–32)
Calcium: 9.1 mg/dL (ref 8.9–10.3)
Creatinine, Ser: 0.51 mg/dL (ref 0.44–1.00)
GFR calc Af Amer: >60 mL/min (ref >60)
GFR calc non Af Amer: >60 mL/min (ref >60)
GLUCOSE: 87 mg/dL (ref 65–99)
POTASSIUM: 3.5 mmol/L (ref 3.5–5.1)
Sodium: 135 mmol/L (ref 135–145)
TOTAL PROTEIN: 6.6 g/dL (ref 6.5–8.1)

## 2016-11-27 LAB — URINALYSIS, COMPLETE (UACMP) WITH MICROSCOPIC
BILIRUBIN URINE: NEGATIVE
GLUCOSE, UA: NEGATIVE mg/dL
KETONES UR: NEGATIVE mg/dL
LEUKOCYTES UA: NEGATIVE
Nitrite: NEGATIVE
PH: 6 (ref 5.0–8.0)
PROTEIN: NEGATIVE mg/dL
Specific Gravity, Urine: 1.019 (ref 1.005–1.030)

## 2016-11-27 LAB — CBC
HEMATOCRIT: 36.2 % (ref 35.0–47.0)
Hemoglobin: 12.4 g/dL (ref 12.0–16.0)
MCH: 28.4 pg (ref 26.0–34.0)
MCHC: 34.3 g/dL (ref 32.0–36.0)
MCV: 82.8 fL (ref 80.0–100.0)
PLATELETS: 243 10*3/uL (ref 150–440)
RBC: 4.38 MIL/uL (ref 3.80–5.20)
RDW: 13 % (ref 11.5–14.5)
WBC: 9.4 10*3/uL (ref 3.6–11.0)

## 2016-11-27 LAB — PROTEIN / CREATININE RATIO, URINE
Creatinine, Urine: 201 mg/dL
Protein Creatinine Ratio: 0.08 mg/mg{Cre} (ref 0.00–0.15)
Total Protein, Urine: 16 mg/dL

## 2016-11-27 MED ORDER — NITROFURANTOIN MONOHYD MACRO 100 MG PO CAPS
100.0000 mg | ORAL_CAPSULE | Freq: Once | ORAL | Status: DC
Start: 2016-11-27 — End: 2016-11-27

## 2016-11-27 MED ORDER — ACETAMINOPHEN 500 MG PO TABS: 1000.0000 mg | ORAL_TABLET | Freq: Once | ORAL | Status: DC

## 2016-11-27 MED ORDER — METOCLOPRAMIDE HCL 5 MG/ML IJ SOLN
10.0000 mg | Freq: Once | INTRAMUSCULAR | Status: AC
  Administered 2016-11-27: 10 mg via INTRAVENOUS
  Filled 2016-11-27: qty 2

## 2016-11-27 MED ORDER — AMOXICILLIN-POT CLAVULANATE 875-125 MG PO TABS: 1.0000 | ORAL_TABLET | Freq: Two times a day (BID) | ORAL | 0 refills | Status: AC

## 2016-11-27 MED ORDER — AMOXICILLIN-POT CLAVULANATE 875-125 MG PO TABS
1.0000 | ORAL_TABLET | Freq: Once | ORAL | Status: AC
  Administered 2016-11-27: 1 via ORAL
  Filled 2016-11-27: qty 1

## 2016-11-27 MED ORDER — SODIUM CHLORIDE 0.9 % IV BOLUS (SEPSIS)
1000.0000 mL | Freq: Once | INTRAVENOUS | Status: AC
  Administered 2016-11-27: 1000 mL via INTRAVENOUS

## 2016-11-27 NOTE — ED Provider Notes (Signed)
[facility] Emergency Department Provider Note  ____________________________________________  Time seen: Approximately 11:04 AM  I have reviewed the triage vital signs and the nursing notes.   HISTORY  Chief Complaint Headache   HPI Alexandria [name] is a 31 y.o. female [phone] currently at [redacted] weeks gestational age who presents for evaluation of headache and dysuria. Patient reports 3 days of urinary frequency and dysuria. Has had a few episodes of nonbloody nonbilious emesis. No abdominal pain, contractions, vaginal discharge, or vaginal bleeding. No flank pain. She also has had a headache for the last month on a daily basis that she describes as frontal, throbbing, moderate, associated with congestion and allergies. No fever or chills. Patient has been taking Tylenol with no improvement of her headache. She has been seen by her OB/GYN for this complaint and she was told this is normal with her pregnancy. She does have a history of headaches in the past which is similar to this one however not as persistent. She denies floaters, changes in vision, confusion. No prior complications with her other pregnancies.  Past Medical History:  Diagnosis Date  . Anxiety   . CIN III (cervical intraepithelial neoplasia III)   . Depression   . Enlarged thyroid   . High risk HPV infection    followed by GYN    Patient Active Problem List   Diagnosis Date Noted  . Supervision of high risk pregnancy, antepartum, first trimester 09/17/2016  . Obesity complicating pregnancy, first trimester 09/17/2016  . Obesity (BMI 35.0-39.9 without comorbidity) 07/14/2016  . Depression 07/11/2016  . Neoplasm of uncertain behavior of skin 05/26/2016  . Verruca vulgaris 10/18/2015  . Callus of foot 10/18/2015  . 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 Anxiety 10/06/2012  . CIN III (cervical intraepithelial neoplasia grade III) with severe dysplasia  08/06/2012    Past Surgical History:  Procedure Laterality Date  . CERVICAL BIOPSY  W/ LOOP ELECTRODE EXCISION    . COLPOSCOPY    . NO PAST SURGERIES      Prior to Admission medications   Medication Sig Start Date End Date Taking? Authorizing Provider  ferrous sulfate 325 (65 FE) MG tablet Take 1 tablet (325 mg total) by mouth daily with breakfast. 10/15/16  Yes Lada, Satira Anis, MD  FLUoxetine (PROZAC) 20 MG capsule Take 3 capsules (60 mg total) by mouth daily. (for mood; stop escitalopram) 07/14/16  Yes Lada, Satira Anis, MD  Prenatal Multivit-Min-Fe-FA (PRENATAL VITAMINS PO) Take by mouth.   Yes [provider]  amoxicillin-clavulanate (AUGMENTIN) 875-125 MG tablet Take 1 tablet by mouth 2 (two) times daily. 11/27/16 12/07/16  Rudene Re, MD    Allergies Patient has no known allergies.  Family History  Problem Relation Age of Onset  . Cancer Mother 65       colon cancer  . Insomnia Mother   . Cancer Maternal Aunt        Cervical Cancer  . Cancer Paternal Aunt        Colon cancer  . Asthma Brother   . Diabetes Maternal Grandmother   . Diabetes Maternal Grandfather     Social History Social History  Substance Use Topics  . Smoking status: Former Research scientist (life sciences)  . Smokeless tobacco: Never Used  . Alcohol use No    Review of Systems  Constitutional: Negative for fever. Eyes: Negative for visual changes. ENT: Negative for sore throat. + congestion Neck: No neck pain  Cardiovascular: Negative for chest pain.  Respiratory: Negative for shortness of breath. Gastrointestinal: Negative for abdominal pain, diarrhea. + N.V Genitourinary: + dysuria. Musculoskeletal: Negative for back pain. Skin: Negative for rash. Neurological: Negative for weakness or numbness. + HA Psych: No SI or HI  ____________________________________________   PHYSICAL EXAM:  VITAL SIGNS: ED Triage Vitals  Enc Vitals Group     BP 11/27/16 0931 123/67     Pulse Rate 11/27/16 0931 100      Resp 11/27/16 0931 18     Temp 11/27/16 0931 98.2 F (36.8 C)     Temp Source 11/27/16 0931 Oral     SpO2 11/27/16 0931 94 %     Weight 11/27/16 0931 215 lb (97.5 kg)     Height 11/27/16 0931 5\' 4"  (1.626 m)     Head Circumference --      Peak Flow --      Pain Score 11/27/16 0930 7     Pain Loc --      Pain Edu? --      Excl. in [place]? --     Constitutional: Alert and oriented. Well appearing and in no apparent distress. HEENT:      Head: Normocephalic and atraumatic.         Eyes: Conjunctivae are normal. Sclera is non-icteric.       Mouth/Throat: Mucous membranes are moist.       Neck: Supple with no signs of meningismus. Cardiovascular: Regular rate and rhythm. No murmurs, gallops, or rubs. 2+ symmetrical distal pulses are present in all extremities. No JVD. Respiratory: Normal respiratory effort. Lungs are clear to auscultation bilaterally. No wheezes, crackles, or rhonchi.  Gastrointestinal: Gravid, non tender, and non distended with positive bowel sounds. No rebound or guarding. Genitourinary: No CVA tenderness. Musculoskeletal: Nontender with normal range of motion in all extremities. No edema, cyanosis, or erythema of extremities. Neurologic: Normal speech and language. Face is symmetric. Moving all extremities. No gross focal neurologic deficits are appreciated. Skin: Skin is warm, dry and intact. No rash noted. Psychiatric: Mood and affect are normal. Speech and behavior are normal.  ____________________________________________   LABS (all labs ordered are listed, but only abnormal results are displayed)  Labs Reviewed  COMPREHENSIVE METABOLIC PANEL - Abnormal; Notable for the following:       Result Value   BUN <5 (*)    Albumin 3.4 (*)    ALT 11 (*)    All other components within normal limits  URINALYSIS, COMPLETE (UACMP) WITH MICROSCOPIC - Abnormal; Notable for the following:    Color, Urine YELLOW (*)    APPearance HAZY (*)    Hgb urine dipstick SMALL (*)     Bacteria, UA FEW (*)    Squamous Epithelial / LPF 6-30 (*)    All other components within normal limits  URINE CULTURE  CBC  PROTEIN / CREATININE RATIO, URINE   ____________________________________________  EKG  none  ____________________________________________  RADIOLOGY  none  ____________________________________________   PROCEDURES  Procedure(s) performed: None Procedures Critical Care performed:  None ____________________________________________   INITIAL IMPRESSION / ASSESSMENT AND PLAN / ED COURSE  31 y.o. female [id number] currently at [redacted] weeks gestational age who presents for evaluation of headache and dysuria.  Patient is extremely well appearing, no distress, has normal vital signs. We'll check urinalysis to evaluate for urinary tract infection. We'll check a basic blood work including CMP and urine protein to creatinine ratio to rule out any evidence of help syndrome or preeclampsia however low suspicion with normal blood  pressure. We'll check fetal heart rate. Will give Reglan, Tylenol, and fluids for her headache. We'll start patient on Augmentin for sinusitis  Clinical Course as of Nov 27 1301  Thu Nov 27, 2016  1259 Patient feels markedly improved with resolution of her headache. She does have a urinary tract infection. Patient was started on Augmentin for sinusitis and UTI. There is no evidence of help syndrome or preeclampsia. She remains extremely well appearing. Fetal heart rate of 135. Patient's condition be discharged home with close follow-up with her OB/GYN. Recommend return to the emergency room she has a fever, abdominal pain, flank pain, or any symptoms that were not present during this visit.  [CV]    Clinical Course User Index [CV] Rudene Re, MD    Pertinent labs & imaging results that were available during my care of the patient were reviewed by me and considered in my medical decision making (see chart for  details).    ____________________________________________   FINAL CLINICAL IMPRESSION(S) / ED DIAGNOSES  Final diagnoses:  Acute nonintractable headache, unspecified headache type  Subacute frontal sinusitis  Urinary tract infection in mother during second trimester of pregnancy      NEW MEDICATIONS STARTED DURING THIS VISIT:  New Prescriptions   AMOXICILLIN-CLAVULANATE (AUGMENTIN) 875-125 MG TABLET    Take 1 tablet by mouth 2 (two) times daily.     Note:  This document was prepared using Dragon voice recognition software and may include unintentional dictation errors.    Alfred Levins, [place], MD 11/27/16 [phone]

## 2016-11-27 NOTE — Discharge Instructions (Signed)
You have been seen in the Emergency Department (ED) for a headache. Your evaluation today was overall reassuring. Headaches have many possible causes. Most headaches aren't a sign of a more serious problem, and they will get better on their own.   Follow-up with your doctor in 12-24 hours if you are still having a headache. Otherwise follow up with your doctor in 3-5 days.  When should you call for help?  Call 911 or return to the ED anytime you think you may need emergency care. For example, call if:  You have signs of a stroke. These may include:  Sudden numbness, paralysis, or weakness in your face, arm, or leg, especially on only one side of your body.  Sudden vision changes.  Sudden trouble speaking.  Sudden confusion or trouble understanding simple statements.  Sudden problems with walking or balance.  A sudden, severe headache that is different from past headaches. You have new or worsening headache Nausea and vomiting associated with your headache Fever, neck stiffness associated with your headache  Call your doctor now or seek immediate medical care if:  You have a new or worse headache.  Your headache gets much worse.  How can you care for yourself at home?  Do not drive if you have taken a prescription pain medicine.  Rest in a quiet, dark room until your headache is gone. Close your eyes and try to relax or go to sleep. Don't watch TV or read.  Put a cold, moist cloth or cold pack on the painful area for 10 to 20 minutes at a time. Put a thin cloth between the cold pack and your skin.  Use a warm, moist towel or a heating pad set on low to relax tight shoulder and neck muscles.  Have someone gently massage your neck and shoulders.  Take pain medicines exactly as directed.  If the doctor gave you a prescription medicine for pain, take it as prescribed.  If you are not taking a prescription pain medicine, ask your doctor if you can take an over-the-counter medicine. Be careful  not to take pain medicine more often than the instructions allow, because you may get worse or more frequent headaches when the medicine wears off.  Do not ignore new symptoms that occur with a headache, such as a fever, weakness or numbness, vision changes, or confusion. These may be signs of a more serious problem.  To prevent headaches  Keep a headache diary so you can figure out what triggers your headaches. Avoiding triggers may help you prevent headaches. Record when each headache began, how long it lasted, and what the pain was like (throbbing, aching, stabbing, or dull). Write down any other symptoms you had with the headache, such as nausea, flashing lights or dark spots, or sensitivity to bright light or loud noise. Note if the headache occurred near your period. List anything that might have triggered the headache, such as certain foods (chocolate, cheese, wine) or odors, smoke, bright light, stress, or lack of sleep.  Find healthy ways to deal with stress. Headaches are most common during or right after stressful times. Take time to relax before and after you do something that has caused a headache in the past.  Try to keep your muscles relaxed by keeping good posture. Check your jaw, face, neck, and shoulder muscles for tension, and try relaxing them. When sitting at a desk, change positions often, and stretch for 30 seconds each hour.  Get plenty of sleep and exercise.  Eat regularly and well. Long periods without food can trigger a headache.  Treat yourself to a massage. Some people find that regular massages are very helpful in relieving tension.  Limit caffeine by not drinking too much coffee, tea, or soda. But don't quit caffeine suddenly, because that can also give you headaches.  Reduce eyestrain from computers by blinking frequently and looking away from the computer screen every so often. Make sure you have proper eyewear and that your monitor is set up properly, about an arm's  length away.  Seek help if you have depression or anxiety. Your headaches may be linked to these conditions. Treatment can both prevent headaches and help with symptoms of anxiety or depression.

## 2016-11-27 NOTE — ED Triage Notes (Signed)
Pt c/o headache for 1 month. [redacted] weeks pregnant [id number].  Pt has told OB and they tell her it is part of pregnancy. Has had vomiting.  Drinking plenty per pt.  Has been taking tylenol.

## 2016-11-28 LAB — URINE CULTURE

## 2016-12-02 ENCOUNTER — Encounter: Payer: Medicaid Other | Admitting: Obstetrics and Gynecology

## 2016-12-08 ENCOUNTER — Ambulatory Visit (INDEPENDENT_AMBULATORY_CARE_PROVIDER_SITE_OTHER): Payer: Medicaid Other | Admitting: Obstetrics and Gynecology

## 2016-12-08 VITALS — BP 114/70 | Wt 215.0 lb

## 2016-12-08 DIAGNOSIS — Z363 Encounter for antenatal screening for malformations: Secondary | ICD-10-CM

## 2016-12-08 DIAGNOSIS — Z3A19 19 weeks gestation of pregnancy: Secondary | ICD-10-CM

## 2016-12-08 NOTE — Progress Notes (Signed)
Pos PNVs. No VB, LOF. Doing well. No complaints. Anat u/s nv.

## 2016-12-08 NOTE — Progress Notes (Signed)
No vb. No lof.  

## 2016-12-12 NOTE — Progress Notes (Deleted)
[facility] Consultation      Assessment and Plan:  Excessive daytime sleepiness.  --Sleep study did not show evidence of significant sleep apnea. -There was evidence of spontaneous arousal index of 5 per hour, likely related to insomnia. -PLMS was noted without significant arousals.  Periodic limb movements in sleep. -Check serum ferritin level, if less than 45 can treat with iron supplementation.  Insomnia. -As evidenced by spontaneous arousal index of 5 per hour and seen on sleep study. -Discussed a trial of sedative medication to see if this helps with daytime sleepiness.  Obesity.  -Weight loss may be beneficial  Nicotine abuse.  --Recently quit smoking, about 3 months.   Depression. -Currently on fluoxetine, which may cause daytime sleepiness and cause REM sleep suppression, contributing to daytime sleepiness.   Date: 12/12/2016  MRN# [id number] Alexandria [name] 05/12/1985  Referring Physician: Dr. lada for snoring.   Alexandria [name] is a 31 y.o. old female seen in consultation for chief complaint of:    No chief complaint on file.   HPI:   She has been feeling tired, she goes to bed between 9 to 11 pm, usually wakes up between 6 and 7 am.  When wakes in am, feels tired, but this has been better since quitting smoking.   She has been snoring and and boyfriend has noted that she has been stopping breathing in her sleep.     Medication:    Current Outpatient Prescriptions:  .  ferrous sulfate 325 (65 FE) MG tablet, Take 1 tablet (325 mg total) by mouth daily with breakfast., Disp: , Rfl: 0 .  FLUoxetine (PROZAC) 20 MG capsule, Take 3 capsules (60 mg total) by mouth daily. (for mood; stop escitalopram), Disp: 90 capsule, Rfl: 6 .  Prenatal Multivit-Min-Fe-FA (PRENATAL VITAMINS PO), Take by mouth., Disp: , Rfl:    Allergies:  Patient has no known allergies.  Review of Systems: Gen:  Denies  fever, sweats, chills HEENT: Denies blurred  vision, double vision. bleeds, sore throat Cvc:  No dizziness, chest pain. Resp:   Denies cough or sputum production, shortness of breath Gi: Denies swallowing difficulty, stomach pain. Gu:  Denies bladder incontinence, burning urine Ext:   No Joint pain, stiffness. Skin: No skin rash,  hives  Endoc:  No polyuria, polydipsia. Psych: No depression, insomnia. Other:  All other systems were reviewed with the patient and were negative other that what is mentioned in the HPI.   Physical Examination:   VS: LMP 06/21/2016 (LMP Unknown)   General Appearance: No distress  Neuro:without focal findings,  speech normal,  HEENT: PERRLA, EOM intact.   Pulmonary: normal breath sounds, No wheezing.  CardiovascularNormal S1,S2.  No m/r/g.   Abdomen: Benign, Soft, non-tender. Renal:  No costovertebral tenderness  GU:  No performed at this time. Endoc: No evident thyromegaly, no signs of acromegaly. Skin:   warm, no rashes, no ecchymosis  Extremities: normal, no cyanosis, clubbing.  Other findings:    LABORATORY PANEL:   CBC No results for input(s): WBC, HGB, HCT, PLT in the last 168 hours. ------------------------------------------------------------------------------------------------------------------  Chemistries  No results for input(s): NA, K, CL, CO2, GLUCOSE, BUN, CREATININE, CALCIUM, MG, AST, ALT, ALKPHOS, BILITOT in the last 168 hours.  Invalid input(s): GFRCGP ------------------------------------------------------------------------------------------------------------------  Cardiac Enzymes No results for input(s): TROPONINI in the last 168 hours. ------------------------------------------------------------  RADIOLOGY:  No results found.     Thank  you for the consultation and for allowing [facility] to  assist in the care of your patient. Our recommendations are noted above.  Please contact us if we can be of further service.   Marda Stalker,  MD.  Board Certified in Internal Medicine, Pulmonary Medicine, [place], and Sleep Medicine.  [facility] Office Number: [phone]  Patricia Pesa, M.D.  Merton Border, M.D  12/12/2016

## 2016-12-15 ENCOUNTER — Ambulatory Visit: Payer: Medicaid Other | Admitting: Internal Medicine

## 2016-12-23 ENCOUNTER — Encounter: Payer: Medicaid Other | Admitting: Obstetrics & Gynecology

## 2016-12-23 ENCOUNTER — Other Ambulatory Visit: Payer: Medicaid Other

## 2016-12-24 ENCOUNTER — Ambulatory Visit (INDEPENDENT_AMBULATORY_CARE_PROVIDER_SITE_OTHER): Payer: Medicaid Other

## 2016-12-24 ENCOUNTER — Encounter: Payer: Self-pay | Admitting: Obstetrics & Gynecology

## 2016-12-24 ENCOUNTER — Ambulatory Visit (INDEPENDENT_AMBULATORY_CARE_PROVIDER_SITE_OTHER): Payer: Medicaid Other | Admitting: Obstetrics & Gynecology

## 2016-12-24 VITALS — BP 120/80 | Wt 211.0 lb

## 2016-12-24 DIAGNOSIS — O0991 Supervision of high risk pregnancy, unspecified, first trimester: Secondary | ICD-10-CM

## 2016-12-24 DIAGNOSIS — Z363 Encounter for antenatal screening for malformations: Secondary | ICD-10-CM | POA: Diagnosis not present

## 2016-12-24 DIAGNOSIS — E669 Obesity, unspecified: Secondary | ICD-10-CM

## 2016-12-24 DIAGNOSIS — Z3A21 21 weeks gestation of pregnancy: Secondary | ICD-10-CM

## 2016-12-24 NOTE — Patient Instructions (Signed)

## 2016-12-24 NOTE — Progress Notes (Addendum)
PNV. Plans breast feeding and minipill. Review of ULTRASOUND.  Needs f/u one month for certain views.    I have personally reviewed images and report of recent ultrasound done at [facility].    Plan of management to be discussed with patient.

## 2017-01-21 ENCOUNTER — Other Ambulatory Visit: Payer: Medicaid Other

## 2017-01-21 ENCOUNTER — Encounter: Payer: Medicaid Other | Admitting: Advanced Practice Midwife

## 2017-01-26 ENCOUNTER — Ambulatory Visit (INDEPENDENT_AMBULATORY_CARE_PROVIDER_SITE_OTHER): Payer: Medicaid Other

## 2017-01-26 ENCOUNTER — Ambulatory Visit (INDEPENDENT_AMBULATORY_CARE_PROVIDER_SITE_OTHER): Payer: Medicaid Other | Admitting: Advanced Practice Midwife

## 2017-01-26 VITALS — BP 110/70 | Wt 212.0 lb

## 2017-01-26 DIAGNOSIS — Z131 Encounter for screening for diabetes mellitus: Secondary | ICD-10-CM

## 2017-01-26 DIAGNOSIS — Z3A21 21 weeks gestation of pregnancy: Secondary | ICD-10-CM

## 2017-01-26 DIAGNOSIS — Z362 Encounter for other antenatal screening follow-up: Secondary | ICD-10-CM

## 2017-01-26 DIAGNOSIS — Z130 Encounter for screening for diseases of the blood and blood-forming organs and certain disorders involving the immune mechanism: Secondary | ICD-10-CM

## 2017-01-26 DIAGNOSIS — Z113 Encounter for screening for infections with a predominantly sexual mode of transmission: Secondary | ICD-10-CM

## 2017-01-26 DIAGNOSIS — Z3A26 26 weeks gestation of pregnancy: Secondary | ICD-10-CM

## 2017-01-26 NOTE — Progress Notes (Signed)
  Routine Prenatal Care Visit  Subjective  Alexandria [name] is a 31 y.o. [id number] at [redacted]w[redacted]d being seen today for ongoing prenatal care.  She is currently monitored for the following issues for this high-risk pregnancy and has CIN III (cervical intraepithelial neoplasia grade III) with severe dysplasia; Anxiety; Hyperhidrosis; Goiter, nontoxic, multinodular; Verruca vulgaris; Callus of foot; Neoplasm of uncertain behavior of skin; Depression; Obesity (BMI 35.0-39.9 without comorbidity); Supervision of high risk pregnancy, antepartum, and Obesity complicating pregnancy, on her problem list.  ----------------------------------------------------------------------------------- Patient reports no complaints.   Contractions: Not present. Vag. Bleeding: None.  Movement: Present. Denies leaking of fluid.  ----------------------------------------------------------------------------------- The following portions of the patient's history were reviewed and updated as appropriate: allergies, current medications, past family history, past medical history, past social history, past surgical history and problem list. Problem list updated.   Objective  Blood pressure 110/70, weight 212 lb (96.2 kg), last menstrual period 06/21/2016 Pregravid weight 210 lb (95.3 kg) Total Weight Gain 2 lb (0.907 kg) Urinalysis: Urine Protein: Negative Urine Glucose: Negative  Fetal Status:     Movement: Present     Follow up anatomy scan is complete for cardiac views  General:  Alert, oriented and cooperative. Patient is in no acute distress.  Skin: Skin is warm and dry. No rash noted.   Cardiovascular: Normal heart rate noted  Respiratory: Normal respiratory effort, no problems with respiration noted  Abdomen: Soft, gravid, appropriate for gestational age. Pain/Pressure: Absent     Pelvic:  Cervical exam deferred        Extremities: Normal range of motion.     Mental Status: Normal mood and affect. Normal behavior. Normal  judgment and thought content.   Assessment   31 y.o. [id number] at [redacted]w[redacted]d by  05/04/2017, by Ultrasound presenting for routine prenatal visit  Plan   Pregnancy#6 Problems (from 07/28/16 to present)    No problems associated with this episode.       Preterm labor symptoms and general obstetric precautions including but not limited to vaginal bleeding, contractions, leaking of fluid and fetal movement were reviewed in detail with the patient.   Return in about 2 weeks (around 02/09/2017) for 28 wk labs, 1 hr gtt, rob.  Rod Can, CNM  01/26/2017 2:59 PM

## 2017-02-09 ENCOUNTER — Other Ambulatory Visit: Payer: Medicaid Other

## 2017-02-09 ENCOUNTER — Encounter: Payer: Medicaid Other | Admitting: Advanced Practice Midwife

## 2017-02-10 ENCOUNTER — Ambulatory Visit (INDEPENDENT_AMBULATORY_CARE_PROVIDER_SITE_OTHER): Payer: Medicaid Other | Admitting: Maternal Newborn

## 2017-02-10 ENCOUNTER — Other Ambulatory Visit: Payer: Medicaid Other

## 2017-02-10 VITALS — BP 114/78 | Wt 210.0 lb

## 2017-02-10 DIAGNOSIS — Z130 Encounter for screening for diseases of the blood and blood-forming organs and certain disorders involving the immune mechanism: Secondary | ICD-10-CM

## 2017-02-10 DIAGNOSIS — Z113 Encounter for screening for infections with a predominantly sexual mode of transmission: Secondary | ICD-10-CM

## 2017-02-10 DIAGNOSIS — O0991 Supervision of high risk pregnancy, unspecified, first trimester: Secondary | ICD-10-CM

## 2017-02-10 DIAGNOSIS — Z3A28 28 weeks gestation of pregnancy: Secondary | ICD-10-CM

## 2017-02-10 DIAGNOSIS — Z131 Encounter for screening for diabetes mellitus: Secondary | ICD-10-CM

## 2017-02-10 MED ORDER — RHO D IMMUNE GLOBULIN 1500 UNIT/2ML IJ SOSY
300.0000 ug | PREFILLED_SYRINGE | Freq: Once | INTRAMUSCULAR | Status: AC
  Administered 2017-02-10: 300 ug via INTRAMUSCULAR

## 2017-02-10 NOTE — Addendum Note (Signed)
Addended by: Brien Few on: 02/10/2017 02:43 PM   Modules accepted: Orders

## 2017-02-10 NOTE — Progress Notes (Signed)
Routine Prenatal Care Visit  Subjective  Alexandria [name] is a 31 y.o. [id number] at [redacted]w[redacted]d being seen today for ongoing prenatal care.  She is currently monitored for the following issues for this high-risk pregnancy and has CIN III (cervical intraepithelial neoplasia grade III) with severe dysplasia; Anxiety; Hyperhidrosis; Goiter, nontoxic, multinodular; Verruca vulgaris; Callus of foot; Neoplasm of uncertain behavior of skin; Depression; Obesity (BMI 35.0-39.9 without comorbidity); Supervision of high risk pregnancy, antepartum, first trimester; and Obesity complicating pregnancy, first trimester on her problem list.  ----------------------------------------------------------------------------------- Patient reports no complaints.   Contractions: Not present. Vag. Bleeding: None.  Movement: Present. Denies leaking of fluid.  ----------------------------------------------------------------------------------- The following portions of the patient's history were reviewed and updated as appropriate: allergies, current medications, past family history, past medical history, past social history, past surgical history and problem list. Problem list updated.   Objective  Blood pressure 114/78, weight 210 lb (95.3 kg), last menstrual period 06/21/2016, unknown if currently breastfeeding. Pregravid weight 210 lb (95.3 kg) Total Weight Gain 0 lb (0 kg) Urinalysis: Urine Protein: Negative Urine Glucose: Negative  Fetal Status: Fetal Heart Rate (bpm): 139 Fundal Height: 29 cm Movement: Present     General:  Alert, oriented and cooperative. Patient is in no acute distress.  Skin: Skin is warm and dry. No rash noted.   Cardiovascular: Normal heart rate noted  Respiratory: Normal respiratory effort, no problems with respiration noted  Abdomen: Soft, gravid, appropriate for gestational age. Pain/Pressure: Absent     Pelvic:  Cervical exam deferred        Extremities: Normal range of motion.     Mental  Status: Normal mood and affect. Normal behavior. Normal judgment and thought content.     Assessment   31 y.o. [id number] at [redacted]w[redacted]d by  05/04/2017, by Ultrasound presenting for routine prenatal visit.  Plan   Pregnancy#6 Problems (from 07/28/16 to present)    Problem Noted Resolved   Supervision of high risk pregnancy, antepartum, first trimester 09/17/2016 by Will Bonnet, MD No   Overview Addendum 09/19/2016  1:07 PM by Will Bonnet, MD    Clinic [facility] Prenatal Labs  Dating  Blood type: A/Negative/-- (05/23 0851)   Genetic Screen 1 Screen:    AFP:     Quad:     NIPS: Antibody:Negative (05/23 0851)  Anatomic [place]  Rubella: 1.01 (05/23 0851) Varicella: Imm  GTT Early:               Third trimester:  RPR: Non Reactive (05/23 0851)   Rhogam 28 wks/postpartum HBsAg: Negative (05/23 0851)   TDaP vaccine                       Flu Shot: HIV: Non Reactive (05/23 0851)   Baby Food                                GBS:   Contraception  Pap:  CBB     CS/VBAC    Support Person                28 week labs today. Good fetal movement.  Preterm labor symptoms and general obstetric precautions including but not limited to vaginal bleeding, contractions, leaking of fluid and fetal movement were reviewed in detail with the patient. Please refer to After Visit Summary for other counseling recommendations.   Return in about 2 weeks (around 02/24/2017)  for ROB.  Avel Sensor, CNM 02/10/2017  11:28 AM

## 2017-02-10 NOTE — Patient Instructions (Signed)
Third Trimester of Pregnancy The third trimester is from week 28 through week 40 (months 7 through 9). The third trimester is a time when the unborn baby (fetus) is growing rapidly. At the end of the ninth month, the fetus is about 20 inches in length and weighs 6-10 pounds. Body changes during your third trimester Your body will continue to go through many changes during pregnancy. The changes vary from woman to woman. During the third trimester:  Your weight will continue to increase. You can expect to gain 25-35 pounds (11-16 kg) by the end of the pregnancy.  You may begin to get stretch marks on your hips, abdomen, and breasts.  You may urinate more often because the fetus is moving lower into your pelvis and pressing on your bladder.  You may develop or continue to have heartburn. This is caused by increased hormones that slow down muscles in the digestive tract.  You may develop or continue to have constipation because increased hormones slow digestion and cause the muscles that push waste through your intestines to relax.  You may develop hemorrhoids. These are swollen veins (varicose veins) in the rectum that can itch or be painful.  You may develop swollen, bulging veins (varicose veins) in your legs.  You may have increased body aches in the pelvis, back, or thighs. This is due to weight gain and increased hormones that are relaxing your joints.  You may have changes in your hair. These can include thickening of your hair, rapid growth, and changes in texture. Some women also have hair loss during or after pregnancy, or hair that feels dry or thin. Your hair will most likely return to normal after your baby is born.  Your breasts will continue to grow and they will continue to become tender. A yellow fluid (colostrum) may leak from your breasts. This is the first milk you are producing for your baby.  Your belly button may stick out.  You may notice more swelling in your hands,  face, or ankles.  You may have increased tingling or numbness in your hands, arms, and legs. The skin on your belly may also feel numb.  You may feel short of breath because of your expanding uterus.  You may have more problems sleeping. This can be caused by the size of your belly, increased need to urinate, and an increase in your body's metabolism.  You may notice the fetus "dropping," or moving lower in your abdomen (lightening).  You may have increased vaginal discharge.  You may notice your joints feel loose and you may have pain around your pelvic bone.  What to expect at prenatal visits You will have prenatal exams every 2 weeks until week 36. Then you will have weekly prenatal exams. During a routine prenatal visit:  You will be weighed to make sure you and the baby are growing normally.  Your blood pressure will be taken.  Your abdomen will be measured to track your baby's growth.  The fetal heartbeat will be listened to.  Any test results from the previous visit will be discussed.  You may have a cervical check near your due date to see if your cervix has softened or thinned (effaced).  You will be tested for Group B streptococcus. This happens between 35 and 37 weeks.  Your health care provider may ask you:  What your birth plan is.  How you are feeling.  If you are feeling the baby move.  If you have had   any abnormal symptoms, such as leaking fluid, bleeding, severe headaches, or abdominal cramping.  If you are using any tobacco products, including cigarettes, chewing tobacco, and electronic cigarettes.  If you have any questions.  Other tests or screenings that may be performed during your third trimester include:  Blood tests that check for low iron levels (anemia).  Fetal testing to check the health, activity level, and growth of the fetus. Testing is done if you have certain medical conditions or if there are problems during the  pregnancy.  Nonstress test (NST). This test checks the health of your baby to make sure there are no signs of problems, such as the baby not getting enough oxygen. During this test, a belt is placed around your belly. The baby is made to move, and its heart rate is monitored during movement.  What is false labor? False labor is a condition in which you feel small, irregular tightenings of the muscles in the womb (contractions) that usually go away with rest, changing position, or drinking water. These are called Braxton Hicks contractions. Contractions may last for hours, days, or even weeks before true labor sets in. If contractions come at regular intervals, become more frequent, increase in intensity, or become painful, you should see your health care provider. What are the signs of labor?  Abdominal cramps.  Regular contractions that start at 10 minutes apart and become stronger and more frequent with time.  Contractions that start on the top of the uterus and spread down to the lower abdomen and back.  Increased pelvic pressure and dull back pain.  A watery or bloody mucus discharge that comes from the vagina.  Leaking of amniotic fluid. This is also known as your "water breaking." It could be a slow trickle or a gush. Let your health care provider know if it has a color or strange odor. If you have any of these signs, call your health care provider right away, even if it is before your due date. Follow these instructions at home: Medicines  Follow your health care provider's instructions regarding medicine use. Specific medicines may be either safe or unsafe to take during pregnancy.  Take a prenatal vitamin that contains at least 600 micrograms (mcg) of folic acid.  If you develop constipation, try taking a stool softener if your health care provider approves. Eating and drinking  Eat a balanced diet that includes fresh fruits and vegetables, whole grains, good sources of protein  such as meat, eggs, or tofu, and low-fat dairy. Your health care provider will help you determine the amount of weight gain that is right for you.  Avoid raw meat and uncooked cheese. These carry germs that can cause birth defects in the baby.  If you have low calcium intake from food, talk to your health care provider about whether you should take a daily calcium supplement.  Eat four or five small meals rather than three large meals a day.  Limit foods that are high in fat and processed sugars, such as fried and sweet foods.  To prevent constipation: ? Drink enough fluid to keep your urine clear or pale yellow. ? Eat foods that are high in fiber, such as fresh fruits and vegetables, whole grains, and beans. Activity  Exercise only as directed by your health care provider. Most women can continue their usual exercise routine during pregnancy. Try to exercise for 30 minutes at least 5 days a week. Stop exercising if you experience uterine contractions.  Avoid heavy   lifting.  Do not exercise in extreme heat or humidity, or at high altitudes.  Wear low-heel, comfortable shoes.  Practice good posture.  You may continue to have sex unless your health care provider tells you otherwise. Relieving pain and discomfort  Take frequent breaks and rest with your legs elevated if you have leg cramps or low back pain.  Take warm sitz baths to soothe any pain or discomfort caused by hemorrhoids. Use hemorrhoid cream if your health care provider approves.  Wear a good support bra to prevent discomfort from breast tenderness.  If you develop varicose veins: ? Wear support pantyhose or compression stockings as told by your healthcare provider. ? Elevate your feet for 15 minutes, 3-4 times a day. Prenatal care  Write down your questions. Take them to your prenatal visits.  Keep all your prenatal visits as told by your health care provider. This is important. Safety  Wear your seat belt at  all times when driving.  Make a list of emergency phone numbers, including numbers for family, friends, the hospital, and police and fire departments. General instructions  Avoid cat litter boxes and soil used by cats. These carry germs that can cause birth defects in the baby. If you have a cat, ask someone to clean the litter box for you.  Do not travel far distances unless it is absolutely necessary and only with the approval of your health care provider.  Do not use hot tubs, steam rooms, or saunas.  Do not drink alcohol.  Do not use any products that contain nicotine or tobacco, such as cigarettes and e-cigarettes. If you need help quitting, ask your health care provider.  Do not use any medicinal herbs or unprescribed drugs. These chemicals affect the formation and growth of the baby.  Do not douche or use tampons or scented sanitary pads.  Do not cross your legs for long periods of time.  To prepare for the arrival of your baby: ? Take prenatal classes to understand, practice, and ask questions about labor and delivery. ? Make a trial run to the hospital. ? Visit the hospital and tour the maternity area. ? Arrange for maternity or paternity leave through employers. ? Arrange for family and friends to take care of pets while you are in the hospital. ? Purchase a rear-facing car seat and make sure you know how to install it in your car. ? Pack your hospital bag. ? Prepare the baby's nursery. Make sure to remove all pillows and stuffed animals from the baby's crib to prevent suffocation.  Visit your dentist if you have not gone during your pregnancy. Use a soft toothbrush to brush your teeth and be gentle when you floss. Contact a health care provider if:  You are unsure if you are in labor or if your water has broken.  You become dizzy.  You have mild pelvic cramps, pelvic pressure, or nagging pain in your abdominal area.  You have lower back pain.  You have persistent  nausea, vomiting, or diarrhea.  You have an unusual or bad smelling vaginal discharge.  You have pain when you urinate. Get help right away if:  Your water breaks before 37 weeks.  You have regular contractions less than 5 minutes apart before 37 weeks.  You have a fever.  You are leaking fluid from your vagina.  You have spotting or bleeding from your vagina.  You have severe abdominal pain or cramping.  You have rapid weight loss or weight gain.    You have shortness of breath with chest pain.  You notice sudden or extreme swelling of your face, hands, ankles, feet, or legs.  Your baby makes fewer than 10 movements in 2 hours.  You have severe headaches that do not go away when you take medicine.  You have vision changes. Summary  The third trimester is from week 28 through week 40, months 7 through 9. The third trimester is a time when the unborn baby (fetus) is growing rapidly.  During the third trimester, your discomfort may increase as you and your baby continue to gain weight. You may have abdominal, leg, and back pain, sleeping problems, and an increased need to urinate.  During the third trimester your breasts will keep growing and they will continue to become tender. A yellow fluid (colostrum) may leak from your breasts. This is the first milk you are producing for your baby.  False labor is a condition in which you feel small, irregular tightenings of the muscles in the womb (contractions) that eventually go away. These are called Braxton Hicks contractions. Contractions may last for hours, days, or even weeks before true labor sets in.  Signs of labor can include: abdominal cramps; regular contractions that start at 10 minutes apart and become stronger and more frequent with time; watery or bloody mucus discharge that comes from the vagina; increased pelvic pressure and dull back pain; and leaking of amniotic fluid. This information is not intended to replace advice  given to you by your health care provider. Make sure you discuss any questions you have with your health care provider. Document Released: 04/08/2001 Document Revised: 09/20/2015 Document Reviewed: 06/15/2012 Elsevier Interactive Patient Education  2017 [employer]  

## 2017-02-11 LAB — 28 WEEKS RH-PANEL
ANTIBODY SCREEN: NEGATIVE
BASOS ABS: 0 10*3/uL (ref 0.0–0.2)
Basos: 0 %
EOS (ABSOLUTE): 0.1 10*3/uL (ref 0.0–0.4)
EOS: 1 %
GESTATIONAL DIABETES SCREEN: 165 mg/dL — AB (ref 65–139)
HIV SCREEN 4TH GENERATION: NONREACTIVE
Hematocrit: 32.9 % — ABNORMAL LOW (ref 34.0–46.6)
Hemoglobin: 10.7 g/dL — ABNORMAL LOW (ref 11.1–15.9)
IMMATURE GRANS (ABS): 0 10*3/uL (ref 0.0–0.1)
Immature Granulocytes: 0 %
LYMPHS: 24 %
Lymphocytes Absolute: 2.1 10*3/uL (ref 0.7–3.1)
MCH: 27.5 pg (ref 26.6–33.0)
MCHC: 32.5 g/dL (ref 31.5–35.7)
MCV: 85 fL (ref 79–97)
MONOCYTES: 6 %
Monocytes Absolute: 0.5 10*3/uL (ref 0.1–0.9)
NEUTROS ABS: 5.8 10*3/uL (ref 1.4–7.0)
Neutrophils: 69 %
Platelets: 228 10*3/uL (ref 150–379)
RBC: 3.89 x10E6/uL (ref 3.77–5.28)
RDW: 13.7 % (ref 12.3–15.4)
RPR Ser Ql: NONREACTIVE
WBC: 8.5 10*3/uL (ref 3.4–10.8)

## 2017-02-12 ENCOUNTER — Other Ambulatory Visit: Payer: Self-pay | Admitting: Advanced Practice Midwife

## 2017-02-12 DIAGNOSIS — O99810 Abnormal glucose complicating pregnancy: Secondary | ICD-10-CM

## 2017-02-17 ENCOUNTER — Ambulatory Visit (INDEPENDENT_AMBULATORY_CARE_PROVIDER_SITE_OTHER): Payer: Medicaid Other | Admitting: Obstetrics and Gynecology

## 2017-02-17 VITALS — BP 106/68 | Wt 211.0 lb

## 2017-02-17 DIAGNOSIS — R7302 Impaired glucose tolerance (oral): Secondary | ICD-10-CM

## 2017-02-17 DIAGNOSIS — O0991 Supervision of high risk pregnancy, unspecified, first trimester: Secondary | ICD-10-CM

## 2017-02-17 DIAGNOSIS — O99211 Obesity complicating pregnancy, first trimester: Secondary | ICD-10-CM

## 2017-02-17 DIAGNOSIS — Z3A29 29 weeks gestation of pregnancy: Secondary | ICD-10-CM

## 2017-02-17 DIAGNOSIS — R7309 Other abnormal glucose: Secondary | ICD-10-CM

## 2017-02-17 NOTE — Progress Notes (Signed)
Routine Prenatal Care Visit  Subjective  Alexandria [name] is a 31 y.o. [id number] at 51w1dbeing seen today for ongoing prenatal care.  She is currently monitored for the following issues for this high-risk pregnancy and has CIN III (cervical intraepithelial neoplasia grade III) with severe dysplasia; Anxiety; Hyperhidrosis; Goiter, nontoxic, multinodular; Verruca vulgaris; Callus of foot; Neoplasm of uncertain behavior of skin; Depression; Obesity (BMI 35.0-39.9 without comorbidity); Supervision of high risk pregnancy, antepartum, first trimester; and Obesity complicating pregnancy, first trimester on her problem list.  ----------------------------------------------------------------------------------- Patient reports no complaints.   Contractions: Not present. Vag. Bleeding: None.  Movement: Present. Denies leaking of fluid.  ----------------------------------------------------------------------------------- The following portions of the patient's history were reviewed and updated as appropriate: allergies, current medications, past family history, past medical history, past social history, past surgical history and problem list. Problem list updated.   Objective  Last menstrual period 06/21/2016, unknown if currently breastfeeding. Pregravid weight 210 lb (95.3 kg) Total Weight Gain 1 lb (0.454 kg)  Body mass index is 36.22 kg/m.  Urinalysis: Urine Protein: Trace Urine Glucose: Negative  Fetal Status: Fetal Heart Rate (bpm): 140 Fundal Height: 30 cm Movement: Present     General:  Alert, oriented and cooperative. Patient is in no acute distress.  Skin: Skin is warm and dry. No rash noted.   Cardiovascular: Normal heart rate noted  Respiratory: Normal respiratory effort, no problems with respiration noted  Abdomen: Soft, gravid, appropriate for gestational age. Pain/Pressure: Absent     Pelvic:  Cervical exam deferred        Extremities: Normal range of motion.     ental Status:  Normal mood and affect. Normal behavior. Normal judgment and thought content.    Immunization History  Administered Date(s) Administered  . MMR 03/24/2013  . Rho (D) Immune Globulin 03/24/2013    Assessment   31y.o. [id number] 212w1dy  05/04/2017, by Ultrasound presenting for routine prenatal visit  Plan   Pregnancy#6 Problems (from 07/28/16 to present)    Problem Noted Resolved   Supervision of high risk pregnancy, antepartum, first trimester 09/17/2016 by JaWill BonnetMD No   Overview Addendum 09/19/2016  1:07 PM by JaWill BonnetMD    [facility] Prenatal Labs  Dating  Blood type: A/Negative/-- (05/23 0851)   Genetic Screen 1 Screen:    AFP:     Quad:     NIPS: Antibody:Negative (05/23 0851)  Anatomic US[place]Rubella: 1.01 (05/23 0851) Varicella: Imm  GTT Early:               Third trimester:  RPR: Non Reactive (05/23 0851)   Rhogam 28 wks/postpartum HBsAg: Negative (05/23 0851)   TDaP vaccine                       Flu Shot: HIV: Non Reactive (05/23 0851)   Baby Food                                GBS:   Contraception  Pap:  CBB     CS/VBAC    Support Person                   Preterm labor symptoms and general obstetric precautions including but not limited to vaginal bleeding, contractions, leaking of fluid and fetal movement were reviewed in detail with the patient. Please refer to After Visit  Summary for other counseling recommendations.  - discussed elevated 1-hr and scheduled 3-hr today Return in about 1 week (around 02/24/2017) for Needs ROB 2 weeks and 3-hr OGTT next available .

## 2017-02-17 NOTE — Progress Notes (Signed)
ROB

## 2017-02-23 ENCOUNTER — Other Ambulatory Visit: Payer: Medicaid Other

## 2017-02-23 DIAGNOSIS — Z3A29 29 weeks gestation of pregnancy: Secondary | ICD-10-CM

## 2017-02-23 DIAGNOSIS — O99211 Obesity complicating pregnancy, first trimester: Secondary | ICD-10-CM

## 2017-02-23 DIAGNOSIS — O0991 Supervision of high risk pregnancy, unspecified, first trimester: Secondary | ICD-10-CM

## 2017-02-23 DIAGNOSIS — R7309 Other abnormal glucose: Secondary | ICD-10-CM

## 2017-02-24 LAB — GESTATIONAL GLUCOSE TOLERANCE
GLUCOSE FASTING: 73 mg/dL (ref 65–94)
Glucose, GTT - 1 Hour: 146 mg/dL (ref 65–179)
Glucose, GTT - 2 Hour: 117 mg/dL (ref 65–154)
Glucose, GTT - 3 Hour: 111 mg/dL (ref 65–139)

## 2017-03-03 ENCOUNTER — Ambulatory Visit (INDEPENDENT_AMBULATORY_CARE_PROVIDER_SITE_OTHER): Payer: Medicaid Other | Admitting: Obstetrics and Gynecology

## 2017-03-03 VITALS — BP 124/72 | Wt 212.0 lb

## 2017-03-03 DIAGNOSIS — Z3A31 31 weeks gestation of pregnancy: Secondary | ICD-10-CM | POA: Diagnosis not present

## 2017-03-03 DIAGNOSIS — O0991 Supervision of high risk pregnancy, unspecified, first trimester: Secondary | ICD-10-CM | POA: Diagnosis not present

## 2017-03-03 DIAGNOSIS — Z23 Encounter for immunization: Secondary | ICD-10-CM

## 2017-03-03 DIAGNOSIS — O0990 Supervision of high risk pregnancy, unspecified, unspecified trimester: Secondary | ICD-10-CM | POA: Diagnosis not present

## 2017-03-03 DIAGNOSIS — Z331 Pregnant state, incidental: Secondary | ICD-10-CM | POA: Diagnosis not present

## 2017-03-03 DIAGNOSIS — O99211 Obesity complicating pregnancy, first trimester: Secondary | ICD-10-CM

## 2017-03-03 NOTE — Progress Notes (Signed)
    Routine Prenatal Care Visit  Subjective  Alexandria [name] is a 31 y.o. [id number] at [redacted]w[redacted]d being seen today for ongoing prenatal care.  She is currently monitored for the following issues for this high-risk pregnancy and has CIN III (cervical intraepithelial neoplasia grade III) with severe dysplasia; Anxiety; Hyperhidrosis; Goiter, nontoxic, multinodular; Verruca vulgaris; Callus of foot; Neoplasm of uncertain behavior of skin; Depression; Obesity (BMI 35.0-39.9 without comorbidity); Supervision of high risk pregnancy, antepartum, first trimester; and Obesity complicating pregnancy, first trimester on their problem list.  ----------------------------------------------------------------------------------- Patient reports no complaints.   Contractions: Not present. Vag. Bleeding: None.  Movement: Present. Denies leaking of fluid.  ----------------------------------------------------------------------------------- The following portions of the patient's history were reviewed and updated as appropriate: allergies, current medications, past family history, past medical history, past social history, past surgical history and problem list. Problem list updated.   Objective  Weight 212 lb (96.2 kg), last menstrual period 06/21/2016, unknown if currently breastfeeding. Pregravid weight 210 lb (95.3 kg) Total Weight Gain 2 lb (0.907 kg) Urinalysis: Urine Protein: 1+ Urine Glucose: Negative  Fetal Status: Fetal Heart Rate (bpm): 140 Fundal Height: 32 cm Movement: Present     General:  Alert, oriented and cooperative. Patient is in no acute distress.  Skin: Skin is warm and dry. No rash noted.   Cardiovascular: Normal heart rate noted  Respiratory: Normal respiratory effort, no problems with respiration noted  Abdomen: Soft, gravid, appropriate for gestational age. Pain/Pressure: Absent     Pelvic:  Cervical exam deferred        Extremities: Normal range of motion.     ental Status: Normal mood and  affect. Normal behavior. Normal judgment and thought content.     Assessment   31 y.o. [id number] at [redacted]w[redacted]d by  05/04/2017, by Ultrasound presenting for routine prenatal visit  Plan   Pregnancy#6 Problems (from 07/28/16 to present)    Problem Noted Resolved   Supervision of high risk pregnancy, antepartum, first trimester 09/17/2016 by Will Bonnet, MD No   Overview Addendum 02/24/2017  8:00 AM by Malachy Mood, MD    [facility]inic [facility] Prenatal Labs  Dating 8 week [place] Blood type: A/Negative/-- (05/23 0851)   Genetic Screen 1 Screen:    AFP:     Quad:     NIPS: Antibody:Negative (05/23 0851)  Anatomic [place]  Rubella: 1.01 (05/23 0851) Varicella: Imm  GTT Third trimester: 169, 3-hr normal 73 /146 / 117 / 111 RPR: Non Reactive (05/23 0851)   Rhogam 02/10/2017 HBsAg: Negative (05/23 0851)   TDaP vaccine                       Flu Shot:unsure 10/23 HIV: Non Reactive (05/23 0851)   Baby Food                                GBS:   Contraception  Pap: NIL and negative 3/18  CBB     CS/VBAC    Support Person                   Preterm labor symptoms and general obstetric precautions including but not limited to vaginal bleeding, contractions, leaking of fluid and fetal movement were reviewed in detail with the patient. Please refer to After Visit Summary for other counseling recommendations.  - flu shot and TDAP today  Return in about 2 weeks (around 03/17/2017) for ROB.

## 2017-03-03 NOTE — Progress Notes (Signed)
ROB Flu vaccine TDAP/Blood form signed

## 2017-03-03 NOTE — Addendum Note (Signed)
Addended by: [place], Jayni Prescher B on: 03/03/2017 04:11 PM   Modules accepted: Orders

## 2017-03-14 ENCOUNTER — Other Ambulatory Visit: Payer: Self-pay | Admitting: Family Medicine

## 2017-03-16 NOTE — Telephone Encounter (Signed)
Chart review shows that she is pregnant Request is for SSRI Patient needs to be getting this from her OB while pregnant

## 2017-03-17 ENCOUNTER — Ambulatory Visit (INDEPENDENT_AMBULATORY_CARE_PROVIDER_SITE_OTHER): Payer: Medicaid Other | Admitting: Obstetrics and Gynecology

## 2017-03-17 VITALS — BP 114/62 | Wt 212.0 lb

## 2017-03-17 DIAGNOSIS — O0991 Supervision of high risk pregnancy, unspecified, first trimester: Secondary | ICD-10-CM

## 2017-03-17 DIAGNOSIS — Z3A33 33 weeks gestation of pregnancy: Secondary | ICD-10-CM

## 2017-03-17 MED ORDER — FLUOXETINE HCL 20 MG PO CAPS: 60.0000 mg | ORAL_CAPSULE | Freq: Every day | ORAL | 6 refills | Status: DC

## 2017-03-17 NOTE — Telephone Encounter (Signed)
Called pt, no answer. LM for pt informing her of the need to her SSRI from [facility]. CRM created.

## 2017-03-17 NOTE — Progress Notes (Signed)
    Routine Prenatal Care Visit  Subjective  Alexandria [name] is a 31 y.o. [id number] at [redacted]w[redacted]d being seen today for ongoing prenatal care.  She is currently monitored for the following issues for this high-risk pregnancy and has CIN III (cervical intraepithelial neoplasia grade III) with severe dysplasia; Anxiety; Hyperhidrosis; Goiter, nontoxic, multinodular; Verruca vulgaris; Neoplasm of uncertain behavior of skin; Depression; Obesity (BMI 35.0-39.9 without comorbidity); Supervision of high risk pregnancy, antepartum, first trimester; and Obesity complicating pregnancy, first trimester on their problem list.  ----------------------------------------------------------------------------------- Patient reports no complaints.   Contractions: Not present. Vag. Bleeding: None.  Movement: Present. Denies leaking of fluid.  ----------------------------------------------------------------------------------- The following portions of the patient's history were reviewed and updated as appropriate: allergies, current medications, past family history, past medical history, past social history, past surgical history and problem list. Problem list updated.   Objective  Blood pressure 114/62, weight 212 lb (96.2 kg), last menstrual period 06/21/2016, unknown if currently breastfeeding. Pregravid weight 210 lb (95.3 kg) Total Weight Gain 2 lb (0.907 kg)  Body mass index is 36.39 kg/m.  Urinalysis: Urine Protein: 1+ Urine Glucose: Negative  Fetal Status: Fetal Heart Rate (bpm): 135 Fundal Height: 34 cm Movement: Present     General:  Alert, oriented and cooperative. Patient is in no acute distress.  Skin: Skin is warm and dry. No rash noted.   Cardiovascular: Normal heart rate noted  Respiratory: Normal respiratory effort, no problems with respiration noted  Abdomen: Soft, gravid, appropriate for gestational age. Pain/Pressure: Absent     Pelvic:  Cervical exam deferred        Extremities: Normal range of  motion.     ental Status: Normal mood and affect. Normal behavior. Normal judgment and thought content.     Assessment   31 y.o. [id number] at [redacted]w[redacted]d by  05/04/2017, by Ultrasound presenting for routine prenatal visit  Plan   Pregnancy#6 Problems (from 07/28/16 to present)    Problem Noted Resolved   Supervision of high risk pregnancy, antepartum, first trimester 09/17/2016 by Will Bonnet, MD No   Overview Addendum 03/17/2017  8:49 PM by Malachy Mood, [place] Prenatal Labs  Dating 8 week [place] Blood type: A/Negative/-- (05/23 0851)   Genetic Screen declined Antibody:Negative (05/23 0851)  Anatomic [place] Normal Female Rubella: 1.01 (05/23 0851) Varicella: Imm  GTT Third trimester: 169, 3-hr normal 73 /146 / 117 / 111 RPR: Non Reactive (05/23 0851)   Rhogam 02/10/2017 HBsAg: Negative (05/23 0851)   TDaP vaccine 03/03/17  Flu Shot: 03/03/17 HIV: Non Reactive (05/23 0851)   Baby Food Breast                     GBS:   Contraception Mini-pill Pap: NIL and negative 3/18  CBB                Preterm labor symptoms and general obstetric precautions including but not limited to vaginal bleeding, contractions, leaking of fluid and fetal movement were reviewed in detail with the patient. Please refer to After Visit Summary for other counseling recommendations.   Return in about 2 weeks (around 03/31/2017) for ROB.

## 2017-03-17 NOTE — Progress Notes (Signed)
ROB Refill on Prozac

## 2017-03-31 ENCOUNTER — Ambulatory Visit (INDEPENDENT_AMBULATORY_CARE_PROVIDER_SITE_OTHER): Payer: Medicaid Other | Admitting: Obstetrics & Gynecology

## 2017-03-31 VITALS — BP 120/80 | Wt 215.0 lb

## 2017-03-31 DIAGNOSIS — O0991 Supervision of high risk pregnancy, unspecified, first trimester: Secondary | ICD-10-CM

## 2017-03-31 DIAGNOSIS — Z3A35 35 weeks gestation of pregnancy: Secondary | ICD-10-CM

## 2017-03-31 DIAGNOSIS — O321XX0 Maternal care for breech presentation, not applicable or unspecified: Secondary | ICD-10-CM

## 2017-03-31 DIAGNOSIS — E669 Obesity, unspecified: Secondary | ICD-10-CM

## 2017-03-31 NOTE — Patient Instructions (Signed)
Third Trimester of Pregnancy The third trimester is from week 28 through week 40 (months 7 through 9). The third trimester is a time when the unborn baby (fetus) is growing rapidly. At the end of the ninth month, the fetus is about 20 inches in length and weighs 6-10 pounds. Body changes during your third trimester Your body will continue to go through many changes during pregnancy. The changes vary from woman to woman. During the third trimester:  Your weight will continue to increase. You can expect to gain 25-35 pounds (11-16 kg) by the end of the pregnancy.  You may begin to get stretch marks on your hips, abdomen, and breasts.  You may urinate more often because the fetus is moving lower into your pelvis and pressing on your bladder.  You may develop or continue to have heartburn. This is caused by increased hormones that slow down muscles in the digestive tract.  You may develop or continue to have constipation because increased hormones slow digestion and cause the muscles that push waste through your intestines to relax.  You may develop hemorrhoids. These are swollen veins (varicose veins) in the rectum that can itch or be painful.  You may develop swollen, bulging veins (varicose veins) in your legs.  You may have increased body aches in the pelvis, back, or thighs. This is due to weight gain and increased hormones that are relaxing your joints.  You may have changes in your hair. These can include thickening of your hair, rapid growth, and changes in texture. Some women also have hair loss during or after pregnancy, or hair that feels dry or thin. Your hair will most likely return to normal after your baby is born.  Your breasts will continue to grow and they will continue to become tender. A yellow fluid (colostrum) may leak from your breasts. This is the first milk you are producing for your baby.  Your belly button may stick out.  You may notice more swelling in your hands,  face, or ankles.  You may have increased tingling or numbness in your hands, arms, and legs. The skin on your belly may also feel numb.  You may feel short of breath because of your expanding uterus.  You may have more problems sleeping. This can be caused by the size of your belly, increased need to urinate, and an increase in your body's metabolism.  You may notice the fetus "dropping," or moving lower in your abdomen (lightening).  You may have increased vaginal discharge.  You may notice your joints feel loose and you may have pain around your pelvic bone.  What to expect at prenatal visits You will have prenatal exams every 2 weeks until week 36. Then you will have weekly prenatal exams. During a routine prenatal visit:  You will be weighed to make sure you and the baby are growing normally.  Your blood pressure will be taken.  Your abdomen will be measured to track your baby's growth.  The fetal heartbeat will be listened to.  Any test results from the previous visit will be discussed.  You may have a cervical check near your due date to see if your cervix has softened or thinned (effaced).  You will be tested for Group B streptococcus. This happens between 35 and 37 weeks.  Your health care provider may ask you:  What your birth plan is.  How you are feeling.  If you are feeling the baby move.  If you have had   any abnormal symptoms, such as leaking fluid, bleeding, severe headaches, or abdominal cramping.  If you are using any tobacco products, including cigarettes, chewing tobacco, and electronic cigarettes.  If you have any questions.  Other tests or screenings that may be performed during your third trimester include:  Blood tests that check for low iron levels (anemia).  Fetal testing to check the health, activity level, and growth of the fetus. Testing is done if you have certain medical conditions or if there are problems during the  pregnancy.  Nonstress test (NST). This test checks the health of your baby to make sure there are no signs of problems, such as the baby not getting enough oxygen. During this test, a belt is placed around your belly. The baby is made to move, and its heart rate is monitored during movement.  What is false labor? False labor is a condition in which you feel small, irregular tightenings of the muscles in the womb (contractions) that usually go away with rest, changing position, or drinking water. These are called Braxton Hicks contractions. Contractions may last for hours, days, or even weeks before true labor sets in. If contractions come at regular intervals, become more frequent, increase in intensity, or become painful, you should see your health care provider. What are the signs of labor?  Abdominal cramps.  Regular contractions that start at 10 minutes apart and become stronger and more frequent with time.  Contractions that start on the top of the uterus and spread down to the lower abdomen and back.  Increased pelvic pressure and dull back pain.  A watery or bloody mucus discharge that comes from the vagina.  Leaking of amniotic fluid. This is also known as your "water breaking." It could be a slow trickle or a gush. Let your health care provider know if it has a color or strange odor. If you have any of these signs, call your health care provider right away, even if it is before your due date. Follow these instructions at home: Medicines  Follow your health care provider's instructions regarding medicine use. Specific medicines may be either safe or unsafe to take during pregnancy.  Take a prenatal vitamin that contains at least 600 micrograms (mcg) of folic acid.  If you develop constipation, try taking a stool softener if your health care provider approves. Eating and drinking  Eat a balanced diet that includes fresh fruits and vegetables, whole grains, good sources of protein  such as meat, eggs, or tofu, and low-fat dairy. Your health care provider will help you determine the amount of weight gain that is right for you.  Avoid raw meat and uncooked cheese. These carry germs that can cause birth defects in the baby.  If you have low calcium intake from food, talk to your health care provider about whether you should take a daily calcium supplement.  Eat four or five small meals rather than three large meals a day.  Limit foods that are high in fat and processed sugars, such as fried and sweet foods.  To prevent constipation: ? Drink enough fluid to keep your urine clear or pale yellow. ? Eat foods that are high in fiber, such as fresh fruits and vegetables, whole grains, and beans. Activity  Exercise only as directed by your health care provider. Most women can continue their usual exercise routine during pregnancy. Try to exercise for 30 minutes at least 5 days a week. Stop exercising if you experience uterine contractions.  Avoid heavy   lifting.  Do not exercise in extreme heat or humidity, or at high altitudes.  Wear low-heel, comfortable shoes.  Practice good posture.  You may continue to have sex unless your health care provider tells you otherwise. Relieving pain and discomfort  Take frequent breaks and rest with your legs elevated if you have leg cramps or low back pain.  Take warm sitz baths to soothe any pain or discomfort caused by hemorrhoids. Use hemorrhoid cream if your health care provider approves.  Wear a good support bra to prevent discomfort from breast tenderness.  If you develop varicose veins: ? Wear support pantyhose or compression stockings as told by your healthcare provider. ? Elevate your feet for 15 minutes, 3-4 times a day. Prenatal care  Write down your questions. Take them to your prenatal visits.  Keep all your prenatal visits as told by your health care provider. This is important. Safety  Wear your seat belt at  all times when driving.  Make a list of emergency phone numbers, including numbers for family, friends, the hospital, and police and fire departments. General instructions  Avoid cat litter boxes and soil used by cats. These carry germs that can cause birth defects in the baby. If you have a cat, ask someone to clean the litter box for you.  Do not travel far distances unless it is absolutely necessary and only with the approval of your health care provider.  Do not use hot tubs, steam rooms, or saunas.  Do not drink alcohol.  Do not use any products that contain nicotine or tobacco, such as cigarettes and e-cigarettes. If you need help quitting, ask your health care provider.  Do not use any medicinal herbs or unprescribed drugs. These chemicals affect the formation and growth of the baby.  Do not douche or use tampons or scented sanitary pads.  Do not cross your legs for long periods of time.  To prepare for the arrival of your baby: ? Take prenatal classes to understand, practice, and ask questions about labor and delivery. ? Make a trial run to the hospital. ? Visit the hospital and tour the maternity area. ? Arrange for maternity or paternity leave through employers. ? Arrange for family and friends to take care of pets while you are in the hospital. ? Purchase a rear-facing car seat and make sure you know how to install it in your car. ? Pack your hospital bag. ? Prepare the baby's nursery. Make sure to remove all pillows and stuffed animals from the baby's crib to prevent suffocation.  Visit your dentist if you have not gone during your pregnancy. Use a soft toothbrush to brush your teeth and be gentle when you floss. Contact a health care provider if:  You are unsure if you are in labor or if your water has broken.  You become dizzy.  You have mild pelvic cramps, pelvic pressure, or nagging pain in your abdominal area.  You have lower back pain.  You have persistent  nausea, vomiting, or diarrhea.  You have an unusual or bad smelling vaginal discharge.  You have pain when you urinate. Get help right away if:  Your water breaks before 37 weeks.  You have regular contractions less than 5 minutes apart before 37 weeks.  You have a fever.  You are leaking fluid from your vagina.  You have spotting or bleeding from your vagina.  You have severe abdominal pain or cramping.  You have rapid weight loss or weight gain.    You have shortness of breath with chest pain.  You notice sudden or extreme swelling of your face, hands, ankles, feet, or legs.  Your baby makes fewer than 10 movements in 2 hours.  You have severe headaches that do not go away when you take medicine.  You have vision changes. Summary  The third trimester is from week 28 through week 40, months 7 through 9. The third trimester is a time when the unborn baby (fetus) is growing rapidly.  During the third trimester, your discomfort may increase as you and your baby continue to gain weight. You may have abdominal, leg, and back pain, sleeping problems, and an increased need to urinate.  During the third trimester your breasts will keep growing and they will continue to become tender. A yellow fluid (colostrum) may leak from your breasts. This is the first milk you are producing for your baby.  False labor is a condition in which you feel small, irregular tightenings of the muscles in the womb (contractions) that eventually go away. These are called Braxton Hicks contractions. Contractions may last for hours, days, or even weeks before true labor sets in.  Signs of labor can include: abdominal cramps; regular contractions that start at 10 minutes apart and become stronger and more frequent with time; watery or bloody mucus discharge that comes from the vagina; increased pelvic pressure and dull back pain; and leaking of amniotic fluid. This information is not intended to replace advice  given to you by your health care provider. Make sure you discuss any questions you have with your health care provider. Document Released: 04/08/2001 Document Revised: 09/20/2015 Document Reviewed: 06/15/2012 Elsevier Interactive Patient Education  2017 [employer]  

## 2017-03-31 NOTE — Progress Notes (Signed)
Breech in past, palpates vtx today   Confirm w [place] PNV, August, :Labor precautions GBS nv

## 2017-04-08 ENCOUNTER — Ambulatory Visit (INDEPENDENT_AMBULATORY_CARE_PROVIDER_SITE_OTHER): Payer: Medicaid Other

## 2017-04-08 ENCOUNTER — Ambulatory Visit (INDEPENDENT_AMBULATORY_CARE_PROVIDER_SITE_OTHER): Payer: Medicaid Other | Admitting: Obstetrics and Gynecology

## 2017-04-08 ENCOUNTER — Other Ambulatory Visit: Payer: Self-pay | Admitting: Obstetrics & Gynecology

## 2017-04-08 VITALS — BP 112/66 | Wt 212.0 lb

## 2017-04-08 DIAGNOSIS — O321XX0 Maternal care for breech presentation, not applicable or unspecified: Secondary | ICD-10-CM

## 2017-04-08 DIAGNOSIS — Z3A36 36 weeks gestation of pregnancy: Secondary | ICD-10-CM

## 2017-04-08 DIAGNOSIS — O0991 Supervision of high risk pregnancy, unspecified, first trimester: Secondary | ICD-10-CM

## 2017-04-08 NOTE — Progress Notes (Signed)
.       Routine Prenatal Care Visit  Subjective  Alexandria [name] is a 31 y.o. [id number] at [redacted]w[redacted]d being seen today for ongoing prenatal care.  She is currently monitored for the following issues for this high-risk pregnancy and has CIN III (cervical intraepithelial neoplasia grade III) with severe dysplasia; Anxiety; Hyperhidrosis; Goiter, nontoxic, multinodular; Verruca vulgaris; Neoplasm of uncertain behavior of skin; Depression; Obesity (BMI 35.0-39.9 without comorbidity); Supervision of high risk pregnancy, antepartum, first trimester; Obesity complicating pregnancy, first trimester; and Postpartum care following vaginal delivery on their problem list.  ----------------------------------------------------------------------------------- Patient reports no complaints.   Pt c/o feeling a lot of pressure. U/s today for presentation. GBS/aptima today Contractions: Not present. Vag. Bleeding: None.  Movement: Present. Denies leaking of fluid.  ----------------------------------------------------------------------------------- The following portions of the patient's history were reviewed and updated as appropriate: allergies, current medications, past family history, past medical history, past social history, past surgical history and problem list. Problem list updated.   Objective  Blood pressure 112/66, weight 212 lb (96.2 kg), last menstrual period 06/21/2016, unknown if currently breastfeeding. Pregravid weight 210 lb (95.3 kg) Total Weight Gain 2 lb (0.907 kg) Urinalysis: Urine Protein: Trace Urine Glucose: Negative  Fetal Status: Fetal Heart Rate (bpm): 140 Fundal Height: 36 cm Movement: Present  Presentation: Vertex  General:  Alert, oriented and cooperative. Patient is in no acute distress.  Skin: Skin is warm and dry. No rash noted.   Cardiovascular: Normal heart rate noted  Respiratory: Normal respiratory effort, no problems with respiration noted  Abdomen: Soft, gravid, appropriate for  gestational age.       Pelvic:  Cervical exam deferred        Extremities: Normal range of motion.     ental Status: Normal mood and affect. Normal behavior. Normal judgment and thought content.     Assessment   31 y.o. [id number] at [redacted]w[redacted]d by  05/04/2017, by Ultrasound presenting for routine prenatal visit  Plan   Pregnancy#6 Problems (from 07/28/16 to 04/22/17)    Problem Noted Resolved   Supervision of high risk pregnancy, antepartum, first trimester 09/17/2016 by Will Bonnet, MD No   Overview Addendum 03/17/2017  8:49 PM by Malachy Mood, [place] Prenatal Labs  Dating 8 week [place] Blood type: A/Negative/-- (05/23 0851)   Genetic Screen declined Antibody:Negative (05/23 0851)  Anatomic [place] Normal Female Rubella: 1.01 (05/23 0851) Varicella: Imm  GTT Third trimester: 169, 3-hr normal 73 /146 / 117 / 111 RPR: Non Reactive (05/23 0851)   Rhogam 02/10/2017 HBsAg: Negative (05/23 0851)   TDaP vaccine 03/03/17  Flu Shot: 03/03/17 HIV: Non Reactive (05/23 0851)   Baby Food Breast                     GBS:   Contraception Mini-pill Pap: NIL and negative 3/18  CBB                Preterm labor symptoms and general obstetric precautions including but not limited to vaginal bleeding, contractions, leaking of fluid and fetal movement were reviewed in detail with the patient. Please refer to After Visit Summary for other counseling recommendations.   Return in about 1 week (around 04/15/2017) for ROB.

## 2017-04-11 LAB — CULTURE, BETA STREP (GROUP B ONLY): STREP GP B CULTURE: POSITIVE — AB

## 2017-04-11 LAB — GC/CHLAMYDIA PROBE AMP
CHLAMYDIA, DNA PROBE: NEGATIVE
NEISSERIA GONORRHOEAE BY PCR: NEGATIVE

## 2017-04-13 NOTE — Progress Notes (Signed)
Called and left message for patient to check MyChart for results.

## 2017-04-15 ENCOUNTER — Ambulatory Visit (INDEPENDENT_AMBULATORY_CARE_PROVIDER_SITE_OTHER): Payer: Medicaid Other | Admitting: Maternal Newborn

## 2017-04-15 ENCOUNTER — Encounter: Payer: Self-pay | Admitting: Maternal Newborn

## 2017-04-15 VITALS — BP 130/78 | Wt 215.0 lb

## 2017-04-15 DIAGNOSIS — O0991 Supervision of high risk pregnancy, unspecified, first trimester: Secondary | ICD-10-CM

## 2017-04-15 DIAGNOSIS — Z3A37 37 weeks gestation of pregnancy: Secondary | ICD-10-CM

## 2017-04-15 NOTE — Progress Notes (Signed)
    Routine Prenatal Care Visit  Subjective  Alexandria [name] is a 31 y.o. [id number] at [redacted]w[redacted]d being seen today for ongoing prenatal care.  She is currently monitored for the following issues for this high-risk pregnancy and has CIN III (cervical intraepithelial neoplasia grade III) with severe dysplasia; Anxiety; Hyperhidrosis; Goiter, nontoxic, multinodular; Verruca vulgaris; Neoplasm of uncertain behavior of skin; Depression; Obesity (BMI 35.0-39.9 without comorbidity); Supervision of high risk pregnancy, antepartum, first trimester; and Obesity complicating pregnancy, first trimester on their problem list.  ----------------------------------------------------------------------------------- Patient reports occasional contractions.   Contractions: Irregular. Vag. Bleeding: None.  Movement: Present. Denies leaking of fluid.  ----------------------------------------------------------------------------------- The following portions of the patient's history were reviewed and updated as appropriate: allergies, current medications, past family history, past medical history, past social history, past surgical history and problem list. Problem list updated.   Objective  Last menstrual period 06/21/2016, unknown if currently breastfeeding. Pregravid weight 210 lb (95.3 kg) Total Weight Gain 5 lb (2.268 kg) Urinalysis: Urine Protein: Negative Urine Glucose: Negative  Fetal Status: Fetal Heart Rate (bpm): 138 Fundal Height: 37 cm Movement: Present  Presentation: Vertex  General:  Alert, oriented and cooperative. Patient is in no acute distress.  Skin: Skin is warm and dry. No rash noted.   Cardiovascular: Normal heart rate noted  Respiratory: Normal respiratory effort, no problems with respiration noted  Abdomen: Soft, gravid, appropriate for gestational age. Pain/Pressure: Present     Pelvic:  Cervical exam performed Dilation: Closed Effacement (%): 20 Station: -3  Extremities: Normal range of motion.   Edema: None  Mental Status: Normal mood and affect. Normal behavior. Normal judgment and thought content.     Assessment   32 y.o. [id number] at [redacted]w[redacted]d, EDD 05/04/2017 by Ultrasound presenting for routine prenatal visit.  Plan   Pregnancy#6 Problems (from 07/28/16 to present)    Problem Noted Resolved   Supervision of high risk pregnancy, antepartum, first trimester 09/17/2016 by Will Bonnet, MD No   Overview Addendum 03/17/2017  8:49 PM by Malachy Mood, [place] Prenatal Labs  Dating 8 week [place] Blood type: A/Negative/-- (05/23 0851)   Genetic Screen declined Antibody:Negative (05/23 0851)  Anatomic [place] Normal Female Rubella: 1.01 (05/23 0851) Varicella: Imm  GTT Third trimester: 169, 3-hr normal 73 /146 / 117 / 111 RPR: Non Reactive (05/23 0851)   Rhogam 02/10/2017 HBsAg: Negative (05/23 0851)   TDaP vaccine 03/03/17  Flu Shot: 03/03/17 HIV: Non Reactive (05/23 0851)   Baby Food Breast                     GBS:   Contraception Mini-pill Pap: NIL and negative 3/18  CBB              Term labor symptoms and general obstetric precautions including but not limited to vaginal bleeding, contractions, leaking of fluid and fetal movement were reviewed in detail with the patient.  Please refer to After Visit Summary for other counseling recommendations.   Return in about 1 week (around 04/22/2017) for ROB.  Avel Sensor, CNM 04/15/2017  4:13 PM

## 2017-04-15 NOTE — Progress Notes (Signed)
C/o pressure, irreg ctxs.

## 2017-04-15 NOTE — Patient Instructions (Signed)
Vaginal Delivery Vaginal delivery means that you will give birth by pushing your baby out of your birth canal (vagina). A team of health care providers will help you before, during, and after vaginal delivery. Birth experiences are unique for every woman and every pregnancy, and birth experiences vary depending on where you choose to give birth. What should I do to prepare for my baby's birth? Before your baby is born, it is important to talk with your health care provider about:  Your labor and delivery preferences. These may include: ? Medicines that you may be given. ? How you will manage your pain. This might include non-medical pain relief techniques or injectable pain relief such as epidural analgesia. ? How you and your baby will be monitored during labor and delivery. ? Who may be in the labor and delivery room with you. ? Your feelings about surgical delivery of your baby (cesarean delivery, or C-section) if this becomes necessary. ? Your feelings about receiving donated blood through an IV tube (blood transfusion) if this becomes necessary.  Whether you are able: ? To take pictures or videos of the birth. ? To eat during labor and delivery. ? To move around, walk, or change positions during labor and delivery.  What to expect after your baby is born, such as: ? Whether delayed umbilical cord clamping and cutting is offered. ? Who will care for your baby right after birth. ? Medicines or tests that may be recommended for your baby. ? Whether breastfeeding is supported in your hospital or birth center. ? How long you will be in the hospital or birth center.  How any medical conditions you have may affect your baby or your labor and delivery experience.  To prepare for your baby's birth, you should also:  Attend all of your health care visits before delivery (prenatal visits) as recommended by your health care provider. This is important.  Prepare your home for your baby's  arrival. Make sure that you have: ? Diapers. ? Baby clothing. ? Feeding equipment. ? Safe sleeping arrangements for you and your baby.  Install a car seat in your vehicle. Have your car seat checked by a certified car seat installer to make sure that it is installed safely.  Think about who will help you with your new baby at home for at least the first several weeks after delivery.  What can I expect when I arrive at the birth center or hospital? Once you are in labor and have been admitted into the hospital or birth center, your health care provider may:  Review your pregnancy history and any concerns you have.  Insert an IV tube into one of your veins. This is used to give you fluids and medicines.  Check your blood pressure, pulse, temperature, and heart rate (vital signs).  Check whether your bag of water (amniotic sac) has broken (ruptured).  Talk with you about your birth plan and discuss pain control options.  Monitoring Your health care provider may monitor your contractions (uterine monitoring) and your baby's heart rate (fetal monitoring). You may need to be monitored:  Often, but not continuously (intermittently).  All the time or for long periods at a time (continuously). Continuous monitoring may be needed if: ? You are taking certain medicines, such as medicine to relieve pain or make your contractions stronger. ? You have pregnancy or labor complications.  Monitoring may be done by:  Placing a special stethoscope or a handheld monitoring device on your abdomen to   check your baby's heartbeat, and feeling your abdomen for contractions. This method of monitoring does not continuously record your baby's heartbeat or your contractions.  Placing monitors on your abdomen (external monitors) to record your baby's heartbeat and the frequency and length of contractions. You may not have to wear external monitors all the time.  Placing monitors inside of your uterus  (internal monitors) to record your baby's heartbeat and the frequency, length, and strength of your contractions. ? Your health care provider may use internal monitors if he or she needs more information about the strength of your contractions or your baby's heart rate. ? Internal monitors are put in place by passing a thin, flexible wire through your vagina and into your uterus. Depending on the type of monitor, it may remain in your uterus or on your baby's head until birth. ? Your health care provider will discuss the benefits and risks of internal monitoring with you and will ask for your permission before inserting the monitors.  Telemetry. This is a type of continuous monitoring that can be done with external or internal monitors. Instead of having to stay in bed, you are able to move around during telemetry. Ask your health care provider if telemetry is an option for you.  Physical exam Your health care provider may perform a physical exam. This may include:  Checking whether your baby is positioned: ? With the head toward your vagina (head-down). This is most common. ? With the head toward the top of your uterus (head-up or breech). If your baby is in a breech position, your health care provider may try to turn your baby to a head-down position so you can deliver vaginally. If it does not seem that your baby can be born vaginally, your provider may recommend surgery to deliver your baby. In rare cases, you may be able to deliver vaginally if your baby is head-up (breech delivery). ? Lying sideways (transverse). Babies that are lying sideways cannot be delivered vaginally.  Checking your cervix to determine: ? Whether it is thinning out (effacing). ? Whether it is opening up (dilating). ? How low your baby has moved into your birth canal.  What are the three stages of labor and delivery?  Normal labor and delivery is divided into the following three stages: Stage 1  Stage 1 is the  longest stage of labor, and it can last for hours or days. Stage 1 includes: ? Early labor. This is when contractions may be irregular, or regular and mild. Generally, early labor contractions are more than 10 minutes apart. ? Active labor. This is when contractions get longer, more regular, more frequent, and more intense. ? The transition phase. This is when contractions happen very close together, are very intense, and may last longer than during any other part of labor.  Contractions generally feel mild, infrequent, and irregular at first. They get stronger, more frequent (about every 2-3 minutes), and more regular as you progress from early labor through active labor and transition.  Many women progress through stage 1 naturally, but you may need help to continue making progress. If this happens, your health care provider may talk with you about: ? Rupturing your amniotic sac if it has not ruptured yet. ? Giving you medicine to help make your contractions stronger and more frequent.  Stage 1 ends when your cervix is completely dilated to 4 inches (10 cm) and completely effaced. This happens at the end of the transition phase. Stage 2  Once   your cervix is completely effaced and dilated to 4 inches (10 cm), you may start to feel an urge to push. It is common for the body to naturally take a rest before feeling the urge to push, especially if you received an epidural or certain other pain medicines. This rest period may last for up to 1-2 hours, depending on your unique labor experience.  During stage 2, contractions are generally less painful, because pushing helps relieve contraction pain. Instead of contraction pain, you may feel stretching and burning pain, especially when the widest part of your baby's head passes through the vaginal opening (crowning).  Your health care provider will closely monitor your pushing progress and your baby's progress through the vagina during stage 2.  Your  health care provider may massage the area of skin between your vaginal opening and anus (perineum) or apply warm compresses to your perineum. This helps it stretch as the baby's head starts to crown, which can help prevent perineal tearing. ? In some cases, an incision may be made in your perineum (episiotomy) to allow the baby to pass through the vaginal opening. An episiotomy helps to make the opening of the vagina larger to allow more room for the baby to fit through.  It is very important to breathe and focus so your health care provider can control the delivery of your baby's head. Your health care provider may have you decrease the intensity of your pushing, to help prevent perineal tearing.  After delivery of your baby's head, the shoulders and the rest of the body generally deliver very quickly and without difficulty.  Once your baby is delivered, the umbilical cord may be cut right away, or this may be delayed for 1-2 minutes, depending on your baby's health. This may vary among health care providers, hospitals, and birth centers.  If you and your baby are healthy enough, your baby may be placed on your chest or abdomen to help maintain the baby's temperature and to help you bond with each other. Some mothers and babies start breastfeeding at this time. Your health care team will dry your baby and help keep your baby warm during this time.  Your baby may need immediate care if he or she: ? Showed signs of distress during labor. ? Has a medical condition. ? Was born too early (prematurely). ? Had a bowel movement before birth (meconium). ? Shows signs of difficulty transitioning from being inside the uterus to being outside of the uterus. If you are planning to breastfeed, your health care team will help you begin a feeding. Stage 3  The third stage of labor starts immediately after the birth of your baby and ends after you deliver the placenta. The placenta is an organ that develops  during pregnancy to provide oxygen and nutrients to your baby in the womb.  Delivering the placenta may require some pushing, and you may have mild contractions. Breastfeeding can stimulate contractions to help you deliver the placenta.  After the placenta is delivered, your uterus should tighten (contract) and become firm. This helps to stop bleeding in your uterus. To help your uterus contract and to control bleeding, your health care provider may: ? Give you medicine by injection, through an IV tube, by mouth, or through your rectum (rectally). ? Massage your abdomen or perform a vaginal exam to remove any blood clots that are left in your uterus. ? Empty your bladder by placing a thin, flexible tube (catheter) into your bladder. ? Encourage   you to breastfeed your baby. After labor is over, you and your baby will be monitored closely to ensure that you are both healthy until you are ready to go home. Your health care team will teach you how to care for yourself and your baby. This information is not intended to replace advice given to you by your health care provider. Make sure you discuss any questions you have with your health care provider. Document Released: 01/22/2008 Document Revised: 11/02/2015 Document Reviewed: 04/29/2015 Elsevier Interactive Patient Education  2018 [employer]  

## 2017-04-20 ENCOUNTER — Observation Stay
Admission: EM | Admit: 2017-04-20 | Discharge: 2017-04-20 | Disposition: A | Discharge status: Home / Self Care | Payer: Medicaid Other | Source: Home / Self Care | Admitting: Obstetrics and Gynecology

## 2017-04-20 ENCOUNTER — Inpatient Hospital Stay: Payer: Medicaid Other | Admitting: Anesthesiology

## 2017-04-20 ENCOUNTER — Inpatient Hospital Stay
Admission: EM | Admit: 2017-04-20 | Discharge: 2017-04-22 | DRG: 807 | Disposition: A | Discharge status: Home / Self Care | Payer: Medicaid Other | Attending: Certified Nurse Midwife | Admitting: Certified Nurse Midwife

## 2017-04-20 ENCOUNTER — Other Ambulatory Visit: Payer: Self-pay

## 2017-04-20 DIAGNOSIS — F329 Major depressive disorder, single episode, unspecified: Secondary | ICD-10-CM | POA: Diagnosis present

## 2017-04-20 DIAGNOSIS — E669 Obesity, unspecified: Secondary | ICD-10-CM | POA: Diagnosis present

## 2017-04-20 DIAGNOSIS — O99214 Obesity complicating childbirth: Secondary | ICD-10-CM | POA: Diagnosis present

## 2017-04-20 DIAGNOSIS — D649 Anemia, unspecified: Secondary | ICD-10-CM | POA: Diagnosis present

## 2017-04-20 DIAGNOSIS — O9902 Anemia complicating childbirth: Secondary | ICD-10-CM | POA: Diagnosis present

## 2017-04-20 DIAGNOSIS — Z6791 Unspecified blood type, Rh negative: Secondary | ICD-10-CM

## 2017-04-20 DIAGNOSIS — E042 Nontoxic multinodular goiter: Secondary | ICD-10-CM | POA: Diagnosis present

## 2017-04-20 DIAGNOSIS — Z87891 Personal history of nicotine dependence: Secondary | ICD-10-CM | POA: Diagnosis not present

## 2017-04-20 DIAGNOSIS — O26893 Other specified pregnancy related conditions, third trimester: Secondary | ICD-10-CM | POA: Diagnosis present

## 2017-04-20 DIAGNOSIS — Z86001 Personal history of in-situ neoplasm of cervix uteri: Secondary | ICD-10-CM

## 2017-04-20 DIAGNOSIS — Z3483 Encounter for supervision of other normal pregnancy, third trimester: Secondary | ICD-10-CM | POA: Diagnosis present

## 2017-04-20 DIAGNOSIS — O99824 Streptococcus B carrier state complicating childbirth: Secondary | ICD-10-CM | POA: Diagnosis present

## 2017-04-20 DIAGNOSIS — O99284 Endocrine, nutritional and metabolic diseases complicating childbirth: Secondary | ICD-10-CM | POA: Diagnosis present

## 2017-04-20 DIAGNOSIS — F419 Anxiety disorder, unspecified: Secondary | ICD-10-CM | POA: Diagnosis present

## 2017-04-20 DIAGNOSIS — O99344 Other mental disorders complicating childbirth: Secondary | ICD-10-CM | POA: Diagnosis present

## 2017-04-20 DIAGNOSIS — Z3A38 38 weeks gestation of pregnancy: Secondary | ICD-10-CM

## 2017-04-20 LAB — CBC
HEMATOCRIT: 35.3 % (ref 35.0–47.0)
HEMOGLOBIN: 12 g/dL (ref 12.0–16.0)
MCH: 28.5 pg (ref 26.0–34.0)
MCHC: 34 g/dL (ref 32.0–36.0)
MCV: 83.9 fL (ref 80.0–100.0)
Platelets: 204 10*3/uL (ref 150–440)
RBC: 4.2 MIL/uL (ref 3.80–5.20)
RDW: 13.9 % (ref 11.5–14.5)
WBC: 12.7 10*3/uL — AB (ref 3.6–11.0)

## 2017-04-20 MED ORDER — BUTORPHANOL TARTRATE 1 MG/ML IJ SOLN
INTRAMUSCULAR | Status: AC
  Filled 2017-04-20: qty 1

## 2017-04-20 MED ORDER — OXYTOCIN 40 UNITS IN LACTATED RINGERS INFUSION - SIMPLE MED
2.5000 [IU]/h | INTRAVENOUS | Status: DC
  Filled 2017-04-20: qty 1000

## 2017-04-20 MED ORDER — DIPHENHYDRAMINE HCL 50 MG/ML IJ SOLN: 12.5000 mg | INTRAMUSCULAR | Status: DC | PRN

## 2017-04-20 MED ORDER — FENTANYL 2.5 MCG/ML W/ROPIVACAINE 0.15% IN NS 100 ML EPIDURAL (ARMC)
EPIDURAL | Status: AC
  Filled 2017-04-20: qty 100

## 2017-04-20 MED ORDER — EPHEDRINE 5 MG/ML INJ
10.0000 mg | INTRAVENOUS | Status: DC | PRN
Start: 2017-04-20 — End: 2017-04-21

## 2017-04-20 MED ORDER — PHENYLEPHRINE 40 MCG/ML (10ML) SYRINGE FOR IV PUSH (FOR BLOOD PRESSURE SUPPORT): 80.0000 ug | PREFILLED_SYRINGE | INTRAVENOUS | Status: DC | PRN

## 2017-04-20 MED ORDER — LACTATED RINGERS IV SOLN
INTRAVENOUS | Status: DC
  Administered 2017-04-20: 1000 mL via INTRAVENOUS
  Administered 2017-04-20 (×2): via INTRAVENOUS

## 2017-04-20 MED ORDER — SODIUM CHLORIDE 0.9 % IV SOLN
INTRAVENOUS | Status: DC | PRN
  Administered 2017-04-20 (×3): 5 mL via EPIDURAL

## 2017-04-20 MED ORDER — LACTATED RINGERS IV SOLN: 500.0000 mL | INTRAVENOUS | Status: DC | PRN

## 2017-04-20 MED ORDER — OXYTOCIN BOLUS FROM INFUSION
500.0000 mL | Freq: Once | INTRAVENOUS | Status: AC
  Administered 2017-04-21: 500 mL via INTRAVENOUS

## 2017-04-20 MED ORDER — SODIUM CHLORIDE 0.9 % IV SOLN
1.0000 g | INTRAVENOUS | Status: DC
  Administered 2017-04-20 – 2017-04-21 (×2): 1 g via INTRAVENOUS
  Filled 2017-04-20 (×2): qty 1000

## 2017-04-20 MED ORDER — LIDOCAINE HCL (PF) 1 % IJ SOLN: 30.0000 mL | INTRAMUSCULAR | Status: DC | PRN

## 2017-04-20 MED ORDER — SODIUM CHLORIDE 0.9 % IV SOLN
2.0000 g | Freq: Once | INTRAVENOUS | Status: AC
  Administered 2017-04-20: 2 g via INTRAVENOUS

## 2017-04-20 MED ORDER — ONDANSETRON HCL 4 MG/2ML IJ SOLN
4.0000 mg | Freq: Four times a day (QID) | INTRAMUSCULAR | Status: DC | PRN
  Filled 2017-04-20: qty 2

## 2017-04-20 MED ORDER — LIDOCAINE HCL (PF) 1 % IJ SOLN
INTRAMUSCULAR | Status: DC | PRN
  Administered 2017-04-20: 3 mL

## 2017-04-20 MED ORDER — BUTORPHANOL TARTRATE 1 MG/ML IJ SOLN
2.0000 mg | Freq: Once | INTRAMUSCULAR | Status: AC
  Administered 2017-04-20: 2 mg via INTRAVENOUS

## 2017-04-20 MED ORDER — FENTANYL 2.5 MCG/ML W/ROPIVACAINE 0.15% IN NS 100 ML EPIDURAL (ARMC)
12.0000 mL/h | EPIDURAL | Status: DC
  Administered 2017-04-20: 12 mL/h via EPIDURAL

## 2017-04-20 MED ORDER — SODIUM CHLORIDE 0.9 % IV SOLN
INTRAVENOUS | Status: AC
  Filled 2017-04-20: qty 2000

## 2017-04-20 MED ORDER — LACTATED RINGERS IV SOLN
500.0000 mL | Freq: Once | INTRAVENOUS | Status: AC
  Administered 2017-04-20: 500 mL via INTRAVENOUS

## 2017-04-20 MED ORDER — EPHEDRINE 5 MG/ML INJ: 10.0000 mg | INTRAVENOUS | Status: DC | PRN

## 2017-04-20 NOTE — Anesthesia Preprocedure Evaluation (Signed)
Anesthesia Evaluation  Patient identified by MRN, date of birth, ID band Patient awake    Reviewed: Allergy & Precautions, NPO status , Patient's Chart, lab work & pertinent test results  History of Anesthesia Complications Negative for: history of anesthetic complications  Airway Mallampati: III  TM Distance: >3 FB Neck ROM: Full    Dental no notable dental hx.    Pulmonary neg sleep apnea, neg COPD, former smoker,    breath sounds clear to auscultation- rhonchi (-) wheezing      Cardiovascular Exercise Tolerance: Good (-) hypertension(-) CAD and (-) Past MI  Rhythm:Regular Rate:Normal - Systolic murmurs and - Diastolic murmurs    Neuro/Psych PSYCHIATRIC DISORDERS Anxiety Depression negative neurological ROS     GI/Hepatic negative GI ROS, Neg liver ROS,   Endo/Other  negative endocrine ROSneg diabetes  Renal/GU negative Renal ROS     Musculoskeletal negative musculoskeletal ROS (+)   Abdominal (+) + obese, Gravid abdomen  Peds  Hematology negative hematology ROS (+)   Anesthesia Other Findings   Reproductive/Obstetrics (+) Pregnancy                             Lab Results  Component Value Date   WBC 12.7 (H) 04/20/2017   HGB 12.0 04/20/2017   HCT 35.3 04/20/2017   MCV 83.9 04/20/2017   PLT 204 04/20/2017    Anesthesia Physical Anesthesia Plan  ASA: II  Anesthesia Plan: Epidural   Post-op Pain Management:    Induction:   PONV Risk Score and Plan: 2  Airway Management Planned:   Additional Equipment:   Intra-op Plan:   Post-operative Plan:   Informed Consent: I have reviewed the patients History and Physical, chart, labs and discussed the procedure including the risks, benefits and alternatives for the proposed anesthesia with the patient or authorized representative who has indicated his/her understanding and acceptance.     Plan Discussed with: CRNA and  Anesthesiologist  Anesthesia Plan Comments: (Plan for epidural for labor, discussed epidural vs spinal vs GA if need for csection)        Anesthesia Quick Evaluation

## 2017-04-20 NOTE — OB Triage Note (Signed)
Pt was here earlier and sent home. Pt has returned with c/o ctx getting closer together. Denies any bleeding, or leaking fluid.

## 2017-04-20 NOTE — Anesthesia Procedure Notes (Signed)
Epidural Patient location during procedure: OB Start time: 04/20/2017 8:40 PM End time: 04/20/2017 8:58 PM  Staffing Anesthesiologist: Emmie Niemann, MD Performed: anesthesiologist   Preanesthetic Checklist Completed: patient identified, site marked, surgical consent, pre-op evaluation, timeout performed, IV checked, risks and benefits discussed and monitors and equipment checked  Epidural Patient position: sitting Prep: ChloraPrep Patient monitoring: heart rate, continuous pulse ox and blood pressure Approach: midline Location: L3-L4 Injection technique: LOR saline  Needle:  Needle type: Tuohy  Needle gauge: 18 G Needle length: 9 cm and 9 Needle insertion depth: 6 cm Catheter type: closed end flexible Catheter size: 20 Guage Catheter at skin depth: 10 cm Test dose: negative (0.125% bupivacaine)  Assessment Events: blood not aspirated, injection not painful, no injection resistance, negative IV test and no paresthesia  Additional Notes   Patient tolerated the insertion well without complications.Reason for block:procedure for pain

## 2017-04-20 NOTE — Progress Notes (Signed)
RN at bedside to reassess pt per primary RN request. Pt lying calm in bed, explained plan for repeat cervical exam, pt states contractions feel stronger, more intense and closer, rates pain 6-7/10, last voided about an hour ago, SVE performed, 1cm with a wiggle, + whitish thick discharge and no blood show noted. Contractions noted q3-5 mins, palpate mild. Explained to pt, will notify OB and primary RN of this exam and will update pt on plan.

## 2017-04-20 NOTE — H&P (Signed)
OB History & Physical   History of Present Illness:  Chief Complaint:  Complains of increasingly more intense contractions since about 1400 today. Denies vaginal bleeding or leakage of fluid HPI:  Alexandria [name] is a 31 y.o. [phone] female with EDC=05/04/2017 presents at [redacted]w[redacted]d dated by 8wk3d ultrasound.  Her pregnancy has been complicated by a history of a LEEP for severe dysplasia, anxiety/depression (on Prozac 60 mgm), multinodular goiter (euthyroid0, obesity and GBS positive. .  She is RH negative and received Rhogam 02/10/2017. She presents to L&D for evaluation of labor. She was seen earlier this AM for a labor rule out and was sent home after her cervix remained 1 cm.   Prenatal care site: Prenatal care at [facility] has been remarkable for  [place] Prenatal Labs  Dating 8 week [place] Blood type: A/Negative/-- (05/23 0851)   Genetic Screen declined Antibody:Negative (05/23 0851)  Anatomic [place] Normal Female Rubella: 1.01 (05/23 0851) Varicella: Imm  GTT Third trimester: 169, 3-hr normal 73 /146 / 117 / 111 RPR: Non Reactive (05/23 0851)   Rhogam 02/10/2017 HBsAg: Negative (05/23 0851)   TDaP vaccine 03/03/17 Flu Shot: 03/03/17 HIV: Non Reactive (05/23 0851)   Baby Food Breast  GBS: positive  Contraception Mini-pill Pap: NIL and negative 3/18  CBB            Total weight gain 5# BMI 36-37 kg/m2  OB Hist: 4 vaginal deliveries with her pelvis proven to 8#6oz.    Maternal Medical History:   Past Medical History:  Diagnosis Date  . Anxiety   . CIN III (cervical intraepithelial neoplasia III)   . Depression   . Enlarged thyroid   . High risk HPV infection    followed by GYN    Past Surgical History:  Procedure Laterality Date  . CERVICAL BIOPSY  W/ LOOP ELECTRODE EXCISION    . COLPOSCOPY      No Known Allergies  Prior to Admission medications   Medication Sig Start Date End Date Taking? Authorizing Provider  ferrous sulfate 325 (65 FE) MG tablet Take 1 tablet  (325 mg total) by mouth daily with breakfast. 10/15/16   Lada, Satira Anis, MD  FLUoxetine (PROZAC) 20 MG capsule Take 3 capsules (60 mg total) by mouth daily. (for mood; stop escitalopram) 03/17/17   Malachy Mood, MD  Prenatal Multivit-Min-Fe-FA (PRENATAL VITAMINS PO) Take by mouth.    [provider]          Social History: She  reports that she has quit smoking. she has never used smokeless tobacco. She reports that she does not drink alcohol or use drugs.  Family History: family history includes Asthma in her brother; Cancer in her maternal aunt and paternal aunt; Cancer (age of onset: 34) in her mother; Diabetes in her maternal grandfather and maternal grandmother; Insomnia in her mother.   Review of Systems: Negative x 10 systems reviewed except as noted in the HPI.      Physical Exam:  Vital Signs: BP 116/69   Pulse 87   Temp 98.7 F (37.1 C) (Axillary)   Resp 18   LMP 06/21/2016 (LMP Unknown)  General: Gravid WF, breathing thru contractions  HEENT: normocephalic, atraumatic Heart: regular rate & rhythm.  No murmurs Lungs: clear to auscultation bilaterally Abdomen: soft, gravid, tender with contractions;  EFW: 8 1/2# Pelvic:   External: Normal external female genitalia  Cervix: Dilation: 3 / Effacement (%): 80 / Station: -2 per RN exam   Extremities: non-tender, symmetric,  trace edema bilaterally.  DTRs: +3  Neurologic: Alert & oriented x 3.   Baseline DHR:CBULAGTX initially 130 with accelerations to 150s; after Stadol baseline 115 with accelrations to 130s and moderate variability, some early decelerations    Toco: contractions every 5-6 minutes apart.  Results for orders placed or performed during the hospital encounter of 04/20/17 (from the past 24 hour(s))  CBC     Status: Abnormal   Collection Time: 04/20/17  6:07 PM  Result Value Ref Range   WBC 12.7 (H) 3.6 - 11.0 K/uL   RBC 4.20 3.80 - 5.20 MIL/uL   Hemoglobin 12.0 12.0 - 16.0 g/dL   HCT  35.3 35.0 - 47.0 %   MCV 83.9 80.0 - 100.0 fL   MCH 28.5 26.0 - 34.0 pg   MCHC 34.0 32.0 - 36.0 g/dL   RDW 13.9 11.5 - 14.5 %   Platelets 204 150 - 440 K/uL  Type and screen [place]     Status: None (Preliminary result)   Collection Time: 04/20/17  6:07 PM  Result Value Ref Range   ABO/RH(D) PENDING    Antibody Screen PENDING    Sample Expiration      04/23/2017 Performed at [facility], [address] [place], [place] [address]   Type and screen     Status: None (Preliminary result)   Collection Time: 04/20/17  6:44 PM  Result Value Ref Range   ABO/RH(D) PENDING    Antibody Screen PENDING    Sample Expiration      04/23/2017 Performed at [facility], [address],  [place], [place] [address]    Assessment:  Alexandria [name] is a 31 y.o. [id number] female at [redacted]w[redacted]d with early labor.  FWB-CAT-1 Plan:  1. Admit to Labor & Delivery   2. CBC, T&S, Clrs, IVF 3. GBS positive-start GBS PPX.   4. Consents obtained. 5. Stadol for pain, epidural when progressing 6. A negative/ RI/ VI 7. Breast/ minipill   Dalia Heading  04/20/2017 7:28 PM

## 2017-04-20 NOTE — Final Progress Note (Signed)
Physician Final Progress Note  Patient ID: Alexandria [name] MRN: [id number] DOB/AGE: Oct 09, 1985 31 y.o.  Admit date: 04/20/2017 Admitting provider: Will Bonnet, MD/ Jesus Genera. Danise Mina, [place] Discharge date: 04/20/2017   Admission Diagnoses: IUP at 38 weeks with contractions  Discharge Diagnoses: IUP at 38 weeks in false labor vs prodromal labor  Consults: None  Significant Findings/ Diagnostic Studies:  Alexandria [name] is a 31 y.o. [phone] with EDC=05/04/2017  by an 8wk3d ultrasound presents at 38 weeks with complaints of contractions since 4PM last night.   She received her prenatal care at [facility] and her prenatal care has been remarkable for a history of CIN III (cervical intraepithelial neoplasia grade III) with severe dysplasia (S/P LEEP); Anxiety; Hyperhidrosis; Goiter, nontoxic, multinodular;  Depression; Obesity (BMI 35-39), and GBS positive. Exam: BP 123/80 (BP Location: Left Arm)   Pulse (!) 103   Temp 98.1 F (36.7 C)   Resp (!) 22   Ht 5\' 4"  (1.626 m)   Wt 97.5 kg (215 lb)   LMP 06/21/2016 (LMP Unknown)   BMI 36.90 kg/m   General: Wf in NAD Patient was monitored for over 7 hours with no change in cervical dilation. Cervix remained 3/15-17%/-6/HYWVPXTGG/ cephalic presentation FHR: 130 with accelerations to 150s to 170, moderate variability Toco: contractions q 5 min apart, palpating mild to moderate.  A: IUP at 38 weeks with false vs prodomal labor No cervical change in over 7 hours of monitoring  P: Home with labor precautions FU at [facility] in 2 days if NIL.  Dalia Heading, CNM   Procedures: NONE  Discharge Condition: stable  Disposition: 01-Home or Self Care  Diet: Regular diet  Discharge Activity: Activity as tolerated   Allergies as of 04/20/2017   No Known Allergies     Medication List    TAKE these medications   ferrous sulfate 325 (65 FE) MG tablet Take 1 tablet (325 mg total) by mouth daily with breakfast.   FLUoxetine  20 MG capsule Commonly known as:  PROZAC Take 3 capsules (60 mg total) by mouth daily. (for mood; stop escitalopram)   PRENATAL VITAMINS PO Take by mouth.        Total time spent taking care of this patient: 15 minutes  Signed: Dalia Heading 04/20/2017, 9:52 AM

## 2017-04-21 DIAGNOSIS — Z3A38 38 weeks gestation of pregnancy: Secondary | ICD-10-CM

## 2017-04-21 LAB — CBC
HEMATOCRIT: 35.6 % (ref 35.0–47.0)
HEMOGLOBIN: 12.1 g/dL (ref 12.0–16.0)
MCH: 28.6 pg (ref 26.0–34.0)
MCHC: 33.9 g/dL (ref 32.0–36.0)
MCV: 84.4 fL (ref 80.0–100.0)
Platelets: 223 10*3/uL (ref 150–440)
RBC: 4.22 MIL/uL (ref 3.80–5.20)
RDW: 13.6 % (ref 11.5–14.5)
WBC: 17.4 10*3/uL — ABNORMAL HIGH (ref 3.6–11.0)

## 2017-04-21 LAB — TYPE AND SCREEN
ABO/RH(D): A NEG
Antibody Screen: POSITIVE
UNIT DIVISION: 0
UNIT DIVISION: 0

## 2017-04-21 LAB — BPAM RBC
Blood Product Expiration Date: 201901182359
Blood Product Expiration Date: 201901182359
Unit Type and Rh: 600
Unit Type and Rh: 600

## 2017-04-21 MED ORDER — MISOPROSTOL 200 MCG PO TABS
800.0000 ug | ORAL_TABLET | Freq: Once | ORAL | Status: AC
  Administered 2017-04-21: 800 ug via RECTAL

## 2017-04-21 MED ORDER — COCONUT OIL OIL: 1.0000 "application " | TOPICAL_OIL | Status: DC | PRN

## 2017-04-21 MED ORDER — BENZOCAINE-MENTHOL 20-0.5 % EX AERO
1.0000 "application " | INHALATION_SPRAY | CUTANEOUS | Status: DC | PRN
  Administered 2017-04-21: 1 via TOPICAL
  Filled 2017-04-21: qty 56

## 2017-04-21 MED ORDER — DIBUCAINE 1 % RE OINT: 1.0000 "application " | TOPICAL_OINTMENT | RECTAL | Status: DC | PRN

## 2017-04-21 MED ORDER — FLUOXETINE HCL 20 MG PO CAPS
60.0000 mg | ORAL_CAPSULE | Freq: Every day | ORAL | Status: DC
  Administered 2017-04-21 – 2017-04-22 (×2): 60 mg via ORAL
  Filled 2017-04-21 (×2): qty 3

## 2017-04-21 MED ORDER — SIMETHICONE 80 MG PO CHEW: 80.0000 mg | CHEWABLE_TABLET | ORAL | Status: DC | PRN

## 2017-04-21 MED ORDER — METHYLERGONOVINE MALEATE 0.2 MG/ML IJ SOLN
INTRAMUSCULAR | Status: AC
  Administered 2017-04-21: 0.2 mg
  Filled 2017-04-21: qty 1

## 2017-04-21 MED ORDER — SENNOSIDES-DOCUSATE SODIUM 8.6-50 MG PO TABS
2.0000 | ORAL_TABLET | ORAL | Status: DC
  Administered 2017-04-22: 2 via ORAL
  Filled 2017-04-21: qty 2

## 2017-04-21 MED ORDER — SODIUM CHLORIDE FLUSH 0.9 % IV SOLN
INTRAVENOUS | Status: AC
  Filled 2017-04-21: qty 20

## 2017-04-21 MED ORDER — OXYCODONE HCL 5 MG PO TABS
5.0000 mg | ORAL_TABLET | ORAL | Status: DC | PRN
  Administered 2017-04-21 – 2017-04-22 (×4): 5 mg via ORAL
  Filled 2017-04-21 (×4): qty 1

## 2017-04-21 MED ORDER — ONDANSETRON HCL 4 MG/2ML IJ SOLN: 4.0000 mg | INTRAMUSCULAR | Status: DC | PRN

## 2017-04-21 MED ORDER — IBUPROFEN 600 MG PO TABS
600.0000 mg | ORAL_TABLET | Freq: Four times a day (QID) | ORAL | Status: DC
  Administered 2017-04-21 – 2017-04-22 (×6): 600 mg via ORAL
  Filled 2017-04-21 (×6): qty 1

## 2017-04-21 MED ORDER — PRENATAL MULTIVITAMIN CH
1.0000 | ORAL_TABLET | Freq: Every day | ORAL | Status: DC
  Administered 2017-04-21 – 2017-04-22 (×2): 1 via ORAL
  Filled 2017-04-21 (×2): qty 1

## 2017-04-21 MED ORDER — WITCH HAZEL-GLYCERIN EX PADS: 1.0000 "application " | MEDICATED_PAD | CUTANEOUS | Status: DC | PRN

## 2017-04-21 MED ORDER — FERROUS SULFATE 325 (65 FE) MG PO TABS
325.0000 mg | ORAL_TABLET | Freq: Every day | ORAL | Status: DC
  Administered 2017-04-21 – 2017-04-22 (×2): 325 mg via ORAL
  Filled 2017-04-21 (×2): qty 1

## 2017-04-21 MED ORDER — ONDANSETRON HCL 4 MG PO TABS: 4.0000 mg | ORAL_TABLET | ORAL | Status: DC | PRN

## 2017-04-21 NOTE — Discharge Instructions (Signed)
°Vaginal Delivery, Care After °Refer to this sheet in the next few weeks. These discharge instructions provide you with information on caring for yourself after delivery. Your caregiver may also give you specific instructions. Your treatment has been planned according to the most current medical practices available, but problems sometimes occur. Call your caregiver if you have any problems or questions after you go home. °HOME CARE INSTRUCTIONS °1. Take over-the-counter or prescription medicines only as directed by your caregiver or pharmacist. °2. Do not drink alcohol, especially if you are breastfeeding or taking medicine to relieve pain. °3. Do not smoke tobacco. °4. Continue to use good perineal care. Good perineal care includes: °1. Wiping your perineum from back to front °2. Keeping your perineum clean. °3. You can do sitz baths twice a day, to help keep this area clean °5. Do not use tampons, douche or have sex for 6 weeks °6. Shower only and avoid sitting in submerged water, aside from sitz baths °7. Wear a well-fitting bra that provides breast support. °8. Eat healthy foods. °9. Drink enough fluids to keep your urine clear or pale yellow. °10. Eat high-fiber foods such as whole grain cereals and breads, brown rice, beans, and fresh fruits and vegetables every day. These foods may help prevent or relieve constipation. °11. Avoid constipation with high fiber foods or medications, such as miralax or metamucil °12. Follow your caregiver's recommendations regarding resumption of activities such as climbing stairs, driving, lifting, exercising, or traveling. °13. Talk to your caregiver about resuming sexual activities. Resumption of sexual activities after 6 weeks is dependent upon your risk of infection, your rate of healing, and your comfort and desire to resume sexual activity. °14. Try to have someone help you with your household activities and your newborn for at least a few days after you leave the  hospital. °15. Rest as much as possible. Try to rest or take a nap when your newborn is sleeping. °16. Increase your activities gradually. °17. Keep all of your scheduled postpartum appointments. It is very important to keep your scheduled follow-up appointments. At these appointments, your caregiver will be checking to make sure that you are healing physically and emotionally. °SEEK MEDICAL CARE IF:  °· You are passing large clots from your vagina. Save any clots to show your caregiver. °· You have a foul smelling discharge from your vagina. °· You have trouble urinating. °· You are urinating frequently. °· You have pain when you urinate. °· You have a change in your bowel movements. °· You have increasing redness, pain, or swelling near your vaginal incision (episiotomy) or vaginal tear. °· You have pus draining from your episiotomy or vaginal tear. °· Your episiotomy or vaginal tear is separating. °· You have painful, hard, or reddened breasts. °· You have a severe headache. °· You have blurred vision or see spots. °· You feel sad or depressed. °· You have thoughts of hurting yourself or your newborn. °· You have questions about your care, the care of your newborn, or medicines. °· You are dizzy or light-headed. °· You have a rash. °· You have nausea or vomiting. °· You were breastfeeding and have not had a menstrual period within 12 weeks after you stopped breastfeeding. °· You are not breastfeeding and have not had a menstrual period by the 12th week after delivery. °· You have a fever of 100.5 or more °SEEK IMMEDIATE MEDICAL CARE IF:  °· You have persistent pain. °· You have chest pain. °· You have shortness   of breath. °· You faint. °· You have leg pain. °· You have stomach pain. °· Your vaginal bleeding saturates two or more sanitary pads in 1 hour. °MAKE SURE YOU:  °· Understand these instructions. °· Will watch your condition. °· Will get help right away if you are not doing well or get worse. °Document  Released: 04/11/2000 Document Revised: 08/29/2013 Document Reviewed: 12/10/2011 °ExitCare® Patient Information ©2015 [employer]. This information is not intended to replace advice given to you by your health care provider. Make sure you discuss any questions you have with your health care provider. ° °Sitz Bath °A sitz bath is a warm water bath taken in the sitting position. The water covers only the hips and butt (buttocks). We recommend using one that fits in the toilet, to help with ease of use and cleanliness. It may be used for either healing or cleaning purposes. Sitz baths are also used to relieve pain, itching, or muscle tightening (spasms). The water may contain medicine. Moist heat will help you heal and relax.  °HOME CARE  °Take 3 to 4 sitz baths a day. °18. Fill the bathtub half-full with warm water. °19. Sit in the water and open the drain a little. °20. Turn on the warm water to keep the tub half-full. Keep the water running constantly. °21. Soak in the water for 15 to 20 minutes. °22. After the sitz bath, pat the affected area dry. °GET HELP RIGHT AWAY IF: °You get worse instead of better. Stop the sitz baths if you get worse. °MAKE SURE YOU: °· Understand these instructions. °· Will watch your condition. °· Will get help right away if you are not doing well or get worse. °Document Released: 05/22/2004 Document Revised: 01/07/2012 Document Reviewed: 08/12/2010 °ExitCare® Patient Information ©2015 [employer]. This information is not intended to replace advice given to you by your health care provider. Make sure you discuss any questions you have with your health care provider. ° ° °

## 2017-04-21 NOTE — Progress Notes (Signed)
Patient ID: Alexandria [name], female   DOB: 09-13-1985, 31 y.o.   MRN: [id number] Admit Date: 04/20/2017 Today's Date: 04/21/2017  Post Partum Day 0  Subjective:  no complaints, up ad lib, voiding and tolerating PO  Objective: Temp:  [97.8 F (36.6 C)-99 F (37.2 C)] 98.1 F (36.7 C) (12/25 0746) Pulse Rate:  [78-125] 78 (12/25 0746) Resp:  [18-20] 20 (12/25 0746) BP: (84-136)/(52-101) 136/74 (12/25 0746) SpO2:  [96 %-100 %] 98 % (12/25 0746) Weight:  [215 lb (97.5 kg)] 215 lb (97.5 kg) (12/24 1900)  Physical Exam:  General: alert, cooperative, appears stated age and no distress Lochia: appropriate Uterine Fundus: firm Incision: none DVT Evaluation: No evidence of DVT seen on physical exam.  Recent Labs    04/20/17 1807  HGB 12.0  HCT 35.3    Assessment/Plan: Breastfeeding and Infant doing well  Will check CBC at noon. Patient needs Rhogam. Baby was positive blood type. Rubella and Varicella immune Plans for Minipill postpartum.    LOS: 1 day   [place] 04/21/2017, 9:04 AM

## 2017-04-21 NOTE — Discharge Summary (Signed)
Physician Obstetric Discharge Summary  Patient ID: Alexandria [name] MRN: [id number] DOB/AGE: [birth year] 31 y.o.   Date of Admission: 04/20/2017 Date of Delivery: 04/21/2017 Date of Discharge: 04/22/2017  Admitting Diagnosis: Onset of Labor at [redacted]w[redacted]d  Secondary Diagnosis: RH negative status, GBS positive  Mode of Delivery: normal spontaneous vaginal delivery 04/21/2017      Discharge Diagnosis: Term intrauterine pregnancy-delivered at 38wk1d, Moderate meconium stained amniotic fluid, nuchal cord x1, Persistent occiput posterior   Intrapartum Procedures: Atificial rupture of membranes, epidural and GBS prophylaxis   Post partum procedures: Rhogam  Complications: none   Brief Hospital Course  Alexandria [name] is a [id number] who had a SVD on 04/21/2017;  for further details of this delivery, please refer to the delivery note.  Patient had an uncomplicated postpartum course.  By time of discharge on PPD#1, her pain was controlled on oral pain medications; she had appropriate lochia and was ambulating, voiding without difficulty and tolerating a regular diet.  She was deemed stable for discharge to home.     Labs: CBC Latest Ref Rng & Units 04/21/2017 04/20/2017 02/10/2017  WBC 3.6 - 11.0 K/uL 17.4(H) 12.7(H) 8.5  Hemoglobin 12.0 - 16.0 g/dL 12.1 12.0 10.7(L)  Hematocrit 35.0 - 47.0 % 35.6 35.3 32.9(L)  Platelets 150 - 440 K/uL 223 204 228   A NEG/ RI/ VI  Physical exam:  Blood pressure (!) 118/59, pulse (!) 124, temperature 98 F (36.7 C), temperature source Oral, resp. rate 18, height 5\' 4"  (1.626 m), weight 97.5 kg (215 lb), last menstrual period 06/21/2016, SpO2 96 %, currently breastfeeding. General: alert and no distress Lochia: appropriate, rubra small Abdomen: soft, NT Uterine Fundus: firm, U/1 No lacerations Extremities: No evidence of DVT seen on physical exam. No lower extremity edema.  Discharge Instructions: Per After Visit Summary. Activity: Advance as tolerated.  Pelvic rest for 6 weeks.  Also refer to Discharge Instructions Diet: Regular Medications: Allergies as of 04/22/2017   No Known Allergies     Medication List    STOP taking these medications   acetaminophen 325 MG tablet Commonly known as:  TYLENOL   ferrous sulfate 325 (65 FE) MG tablet     TAKE these medications   FLUoxetine 20 MG capsule Commonly known as:  PROZAC Take 3 capsules (60 mg total) by mouth daily. (for mood; stop escitalopram)   ibuprofen 600 MG tablet Commonly known as:  ADVIL,MOTRIN Take 1 tablet (600 mg total) by mouth every 6 (six) hours.   PRENATAL VITAMINS PO Take by mouth.      Outpatient follow up:  Follow-up Information    Dalia Heading, CNM. Schedule an appointment as soon as possible for a visit in 2 week(s).   Specialty:  Certified Nurse Midwife Why:  for depression check Contact information: [address] [place] [address] [phone]          Postpartum contraception: oral progesterone-only contraceptive TDAP given AP  Discharged Condition: good  Discharged to: home   Newborn Data: Female/ Freida Busman III Disposition:home with mother  Apgars: APGAR (1 MIN): 7   APGAR (5 MINS): 9    Baby Feeding: Breast and formula  Rexene Agent, CNM 04/22/2017 10:20 AM

## 2017-04-22 ENCOUNTER — Encounter: Payer: Medicaid Other | Admitting: Advanced Practice Midwife

## 2017-04-22 LAB — FETAL SCREEN: FETAL SCREEN: NEGATIVE

## 2017-04-22 MED ORDER — IBUPROFEN 600 MG PO TABS: 600.0000 mg | ORAL_TABLET | Freq: Four times a day (QID) | ORAL | 0 refills | Status: DC

## 2017-04-22 MED ORDER — RHO D IMMUNE GLOBULIN 1500 UNIT/2ML IJ SOSY
300.0000 ug | PREFILLED_SYRINGE | Freq: Once | INTRAMUSCULAR | Status: AC
  Administered 2017-04-22: 300 ug via INTRAMUSCULAR
  Filled 2017-04-22: qty 2

## 2017-04-22 NOTE — Progress Notes (Signed)
Pt discharged with infant.  Discharge instructions, prescriptions and follow up appointment given to and reviewed with pt. Pt verbalized understanding. Escorted out by auxillary. 

## 2017-04-22 NOTE — Anesthesia Postprocedure Evaluation (Signed)
Anesthesia Post Note  Patient: Alexandria [name]  Procedure(s) Performed: AN AD [place]  Patient location during evaluation: Mother Baby Anesthesia Type: Epidural Level of consciousness: awake and alert and oriented Pain management: pain level controlled Vital Signs Assessment: post-procedure vital signs reviewed and stable Respiratory status: spontaneous breathing and respiratory function stable Cardiovascular status: stable Postop Assessment: no backache, no apparent nausea or vomiting and adequate PO intake Anesthetic complications: no     Last Vitals:  Vitals:   04/21/17 2329 04/22/17 0720  BP: (!) 97/58 109/64  Pulse: 71 76  Resp: 20 18  Temp: (!) 36.4 C 37 C  SpO2:  97%    Last Pain:  Vitals:   04/22/17 0845  TempSrc:   PainSc: 4                  Lanora Manis

## 2017-04-23 LAB — RHOGAM INJECTION: Unit division: 0

## 2017-04-23 LAB — RPR, QUANT+TP ABS (REFLEX): TREPONEMA PALLIDUM AB: NEGATIVE

## 2017-04-23 LAB — RPR: RPR Ser Ql: REACTIVE — AB

## 2017-04-24 ENCOUNTER — Telehealth: Payer: Self-pay

## 2017-04-24 ENCOUNTER — Encounter: Payer: Self-pay | Admitting: Obstetrics and Gynecology

## 2017-04-24 ENCOUNTER — Encounter: Payer: Self-pay | Admitting: Certified Nurse Midwife

## 2017-04-24 NOTE — Telephone Encounter (Signed)
Spoke to patient, explained bleeding pattern was normal.

## 2017-04-24 NOTE — Telephone Encounter (Signed)
Pt returning call regarding questions about bleeding after delivery. YN#[phone]

## 2017-04-24 NOTE — Telephone Encounter (Signed)
Pt delivered by [facility] on 04/21/17 & has a question. No other details given. QQ#[phone]. Bad cell reception. Message distorted.

## 2017-04-24 NOTE — Telephone Encounter (Signed)
Tried to call patient. Left msg to call back.

## 2017-04-24 NOTE — Telephone Encounter (Signed)
Patient already has spoken with nurse.

## 2017-05-04 ENCOUNTER — Encounter: Payer: Self-pay | Admitting: Obstetrics and Gynecology

## 2017-05-04 ENCOUNTER — Other Ambulatory Visit: Payer: Self-pay | Admitting: Obstetrics and Gynecology

## 2017-05-04 ENCOUNTER — Ambulatory Visit (INDEPENDENT_AMBULATORY_CARE_PROVIDER_SITE_OTHER): Payer: Medicaid Other | Admitting: Obstetrics and Gynecology

## 2017-05-04 VITALS — BP 110/72 | HR 83 | Ht 64.0 in | Wt 199.0 lb

## 2017-05-04 DIAGNOSIS — F411 Generalized anxiety disorder: Secondary | ICD-10-CM

## 2017-05-04 MED ORDER — BUSPIRONE HCL 10 MG PO TABS: 10.0000 mg | ORAL_TABLET | Freq: Two times a day (BID) | ORAL | 2 refills | Status: DC

## 2017-05-04 NOTE — Progress Notes (Signed)
Obstetrics & Gynecology Office Visit   Chief Complaint:  Chief Complaint  Patient presents with  . Postpartum Care    Delivered 12/25  . Routine postpartum    Postpartum depression    History of Present Illness: The patient is a 32 y.o. female presenting follow up for symptoms of anxiety and depression.  The patient is currently taking prozac 60mg  for the management of her symptoms.  She has had any recent situational stressors, namely recent delivery and newborn.  She reports symptoms of insomnia, irritability, social anxiety and visual hallucinations.  She denies anhedonia, day time somnolence, risk taking behavior, increased appetite, decreased appetite, agorophobia, feelings of guilt, feelings of worthlessness, suicidal ideation, homicidal ideation, auditory hallucinations and visual hallucinations. Symptoms have worsened since last visit.     The patient does have a pre-existing history of depression and anxiety.  She  does not a prior history of suicide attempts.  Previous treatment tied include Prozac and Buspar.    Review of Systems: Review of Systems  Gastrointestinal: Negative for abdominal pain.  Neurological: Negative for dizziness, sensory change and headaches.  Psychiatric/Behavioral: Positive for memory loss. Negative for depression, hallucinations, substance abuse and suicidal ideas. The patient is nervous/anxious and has insomnia.     Past Medical History:  Past Medical History:  Diagnosis Date  . Anxiety   . CIN III (cervical intraepithelial neoplasia III)   . Depression   . Enlarged thyroid   . High risk HPV infection    followed by GYN    Past Surgical History:  Past Surgical History:  Procedure Laterality Date  . CERVICAL BIOPSY  W/ LOOP ELECTRODE EXCISION    . COLPOSCOPY      Gynecologic History: No LMP recorded.  Obstetric History: [id number]  Family History:  Family History  Problem Relation Age of Onset  . Insomnia Mother   . Colon cancer  Mother 54       colon cancer  . Cervical cancer Maternal Aunt        Cervical Cancer  . Colon cancer Paternal Aunt        Colon cancer  . Asthma Brother   . Diabetes Maternal Grandmother   . Diabetes Maternal Grandfather     Social History:  Social History   Socioeconomic History  . Marital status: Single    Spouse name: Not on file  . Number of children: Not on file  . Years of education: Not on file  . Highest education level: Not on file  Social Needs  . Financial resource strain: Not on file  . Food insecurity - worry: Not on file  . Food insecurity - inability: Not on file  . Transportation needs - medical: Not on file  . Transportation needs - non-medical: Not on file  Occupational History  . Not on file  Tobacco Use  . Smoking status: Former Research scientist (life sciences)  . Smokeless tobacco: Never Used  Substance and Sexual Activity  . Alcohol use: No    Alcohol/week: 0.0 oz  . Drug use: No  . Sexual activity: Yes    Birth control/protection: None  Other Topics Concern  . Not on file  Social History Narrative  . Not on file    Allergies:  No Known Allergies  Medications: Prior to Admission medications   Medication Sig Start Date End Date Taking? Authorizing Provider  busPIRone (BUSPAR) 10 MG tablet Take 1 tablet (10 mg total) by mouth 2 (two) times daily. 05/04/17   Chancey Cullinane,  Conan Bowens, MD  FLUoxetine (PROZAC) 20 MG capsule Take 3 capsules (60 mg total) by mouth daily. (for mood; stop escitalopram) 03/17/17   Malachy Mood, MD  ibuprofen (ADVIL,MOTRIN) 600 MG tablet Take 1 tablet (600 mg total) by mouth every 6 (six) hours. 04/22/17   Rexene Agent, CNM  Prenatal Multivit-Min-Fe-FA (PRENATAL VITAMINS PO) Take by mouth.    [provider]    Physical Exam Vitals:  Vitals:   05/04/17 1414  BP: 110/72  Pulse: 83   No LMP recorded.  General: NAD HEENT: normocephalic, anicteric Pulmonary: No increased work of breathing Neurologic: Grossly  intact Psychiatric: mood appropriate, affect full  Female chaperone present for pelvic and breast  portions of the physical exam  Assessment: 32 y.o. [id number] postpartum depression check  Plan: Problem List Items Addressed This Visit    None    Visit Diagnoses    Generalized anxiety disorder    -  Primary   Relevant Medications   busPIRone (BUSPAR) 10 MG tablet     Doing well, EPDS 11 item 10 is 0.  Does report increased anxiety, had buspar added postpartum and did well with that after last delivery.   Will add buspar and assess response in 4 week at time of 6 week postpartum visit.

## 2017-05-05 ENCOUNTER — Encounter: Payer: Self-pay | Admitting: Obstetrics and Gynecology

## 2017-05-07 ENCOUNTER — Other Ambulatory Visit: Payer: Self-pay | Admitting: Obstetrics and Gynecology

## 2017-05-07 MED ORDER — NORETHINDRONE 0.35 MG PO TABS: 1.0000 | ORAL_TABLET | Freq: Every day | ORAL | 11 refills | Status: DC

## 2017-05-18 ENCOUNTER — Encounter: Payer: Self-pay | Admitting: Obstetrics and Gynecology

## 2017-05-26 ENCOUNTER — Other Ambulatory Visit: Payer: Self-pay | Admitting: Obstetrics and Gynecology

## 2017-05-26 MED ORDER — BUSPIRONE HCL 15 MG PO TABS: 15.0000 mg | ORAL_TABLET | Freq: Two times a day (BID) | ORAL | 3 refills | Status: DC

## 2017-06-01 ENCOUNTER — Ambulatory Visit (INDEPENDENT_AMBULATORY_CARE_PROVIDER_SITE_OTHER): Payer: Medicaid Other | Admitting: Certified Nurse Midwife

## 2017-06-01 ENCOUNTER — Encounter: Payer: Self-pay | Admitting: Certified Nurse Midwife

## 2017-06-01 VITALS — BP 102/72 | HR 72 | Ht 64.0 in | Wt 196.0 lb

## 2017-06-01 DIAGNOSIS — E041 Nontoxic single thyroid nodule: Secondary | ICD-10-CM

## 2017-06-01 NOTE — Progress Notes (Signed)
Postpartum Visit  Chief Complaint:  Chief Complaint  Patient presents with  . Postpartum Care    6 wk pp    History of Present Illness: Alexandria [name] is a 32 y.o. [id number] WF who presents for her 6 week postpartum visit.  Date of delivery: 04/21/2017 Type of delivery: Vaginal delivery - Vacuum or forceps assisted  no Episiotomy No.  Laceration: no  Pregnancy or labor problems:  Yes, anxiety, depression, obesity. Baby in persistent OP presentation Any problems since the delivery:  Yes, increased anxiety-Buspar was increased to 7.5mg m BID  Newborn Details:  SINGLETON :  1. Baby's name: Freida Busman III. Birth weight: 7#3oz Maternal Details:  Breast Feeding:  Breast and bottle Post partum depression/anxiety noted:  Yes, on Prozac 60 mgm daily and Buspar 7.5mg m BID Edinburgh Post-Partum Depression Score:  5 with no suicidal ideation, and GAD was 8 and not at all difficult.  Date of last PAP: 06/2016  normal   Review of Systems: ROS  Past Medical History:  Past Medical History:  Diagnosis Date  . Anxiety   . CIN III (cervical intraepithelial neoplasia III)    2011 or 2012-LEEP  . Depression   . High risk HPV infection    followed by GYN  . Multinodular goiter     Past Surgical History:  Past Surgical History:  Procedure Laterality Date  . 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 CERVICAL BIOPSY  W/ LOOP ELECTRODE EXCISION  ?2011 or 2012   [facility]  . COLPOSCOPY  2011 or 2012?   [facility], Dr Ardell Isaacs    Family History:  Family History  Problem Relation Age of Onset  . Insomnia Mother   . Colon cancer Mother 35       colon cancer  . Cervical cancer Maternal Aunt        Cervical Cancer  . Colon cancer Paternal Aunt        Colon cancer  . Asthma Brother   . Diabetes Maternal Grandmother   . Diabetes Maternal Grandfather     Social History:  Social History   Socioeconomic History  . Marital status: Single    Spouse  name: Freida Busman  . Number of children: 5  . Years of education: Not on file  . Highest education level: Not on file  Social Needs  . Financial resource strain: Not on file  . Food insecurity - worry: Not on file  . Food insecurity - inability: Not on file  . Transportation needs - medical: Not on file  . Transportation needs - non-medical: Not on file  Occupational History  . Not on file  Tobacco Use  . Smoking status: Former Research scientist (life sciences)  . Smokeless tobacco: Never Used  Substance and Sexual Activity  . Alcohol use: No    Alcohol/week: 0.0 oz  . Drug use: No  . Sexual activity: Yes    Partners: Male    Birth control/protection: None  Other Topics Concern  . Not on file  Social History Narrative  . Not on file    Allergies:  No Known Allergies  Medications: Prior to Admission medications   Medication Sig Start Date End Date Taking? Authorizing Provider  busPIRone (BUSPAR) 15 MG tablet Take 1 tablet (15 mg total) by mouth 2 (two) times daily. 05/26/17   Malachy Mood, MD  FLUoxetine (PROZAC) 20 MG capsule Take 3 capsules (60 mg total) by mouth daily. (for mood; stop escitalopram) 03/17/17  Malachy Mood, MD  ibuprofen (ADVIL,MOTRIN) 600 MG tablet Take 1 tablet (600 mg total) by mouth every 6 (six) hours. 04/22/17   Rexene Agent, CNM  norethindrone (MICRONOR,CAMILA,ERRIN) 0.35 MG tablet Take 1 tablet (0.35 mg total) by mouth daily. 05/07/17   Malachy Mood, MD  Prenatal Multivit-Min-Fe-FA (PRENATAL VITAMINS PO) Take by mouth.    [provider]    Physical Exam Vitals:  Vitals:   06/01/17 0933  BP: 102/72  Pulse: 72    General: WF in NAD HEENT: normocephalic, anicteric Neck: No thyroid enlargement, 1 cm nodule palpable on right lobe, no cervical lymphadenpathy Breast: Lactating, no inflammation, no masses, nipples intact Pulmonary: No increased work of breathing, CTAB Abdomen: Soft, non-tender, non-distended.  Umbilicus without lesions.  No  hepatomegaly or masses palpable. No evidence of hernia. Genitourinary:  External: Well healed perineum, no lesions or inflammation    Vagina: Normal vaginal mucosa, no evidence of prolapse.    Cervix: closed, mobile, no bleeding  Uterus: Well involuted, mobile, non-tender  Adnexa: No adnexal masses, non-tender  Rectal: deferred, external hemorrhoid present Extremities: no edema, erythema, or tenderness Neurologic: Grossly intact Psychiatric: mood appropriate, affect full  Assessment/Plan: 32 y.o. [id number] presenting for 6 week postpartum visit-normal involution  History of multinodular goiter with dominant right lobe nodule. 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  Review of chart shows that patient was to have another biopsy early 2016 but did not return (has had 2 more pregnancies since then)  TSH drawn-but needs referral to endocrinology (was seen by Dr Cristino Martes at [facility] in 2015)-will contact PCP for referral  Hx of CIN III-asked patient to request records from [facility] regarding LEEP pathology  Last Pap NIL/neg HRHPV on 07/11/2016-next due after 07/10/2017 at PCP  Contraception Education given regarding how to take POP including the 3 hour rule.Has not picked up RX yet from pharmacy.  Anxiety and depression-doing well on the current medication-follow up with PCP  Can return to normal activity, recommend continuing prenatal vitamins.  RTO prn  Dalia Heading, CNM Dalia Heading, [place]

## 2017-06-02 ENCOUNTER — Telehealth: Payer: Self-pay | Admitting: Family Medicine

## 2017-06-02 LAB — TSH: TSH: 1.5 u[IU]/mL (ref 0.450–4.500)

## 2017-06-02 NOTE — Telephone Encounter (Signed)
Copied from [place] [id number]. Topic: Quick Communication - See Telephone Encounter >> Jun 02, 2017  9:47 AM Boyd Kerbs wrote: CRM for notification. See Telephone encounter for:   Coleen from [facility] [phone] thinks Dr. Sanda Klein may need to refer pt. to Endo, she just had a baby and was doing a check up and found a Thyroid  nodule.    Please call Coleen and she will go over with Dr. Sanda Klein   06/02/17.

## 2017-06-02 NOTE — Telephone Encounter (Signed)
I think patient already has an endocrinologist Please talk with patient, find out who she sees or used to see and refer back if needed Thank you

## 2017-06-02 NOTE — Telephone Encounter (Signed)
Called pt no answer. LM for pt informing her that Java from [facility] reached out to [place] regarding her thyroid. Advised pt that she is already established with Endo at [facility]. Pt should just be able to call them and schedule. If not pt may call us back if referral is needed. CRM created.

## 2017-06-03 ENCOUNTER — Ambulatory Visit: Payer: Medicaid Other | Admitting: Internal Medicine

## 2017-06-03 ENCOUNTER — Telehealth: Payer: Self-pay

## 2017-06-03 DIAGNOSIS — E042 Nontoxic multinodular goiter: Secondary | ICD-10-CM

## 2017-06-03 NOTE — Telephone Encounter (Signed)
Pt states she has never seen an endocrinologist.    Pt does need that referral   Anyone in [place] is fine

## 2017-06-03 NOTE — Telephone Encounter (Signed)
Copied from [place]. Topic: Quick Communication - See Telephone Encounter >> Jun 03, 2017  1:46 PM Carolyn Stare wrote:  Pt call to say she is not a pt of [facility]. She said she contacted that office and they told her she was not. She is asking for a referral to go there   Referral placed. OG

## 2017-06-03 NOTE — Telephone Encounter (Signed)
Pt was seen in 2015 by Dr.Klett (Endocrinology) pt will need to re-establish. Referral placed.

## 2017-06-04 ENCOUNTER — Ambulatory Visit (INDEPENDENT_AMBULATORY_CARE_PROVIDER_SITE_OTHER): Payer: Medicaid Other | Admitting: Internal Medicine

## 2017-06-04 ENCOUNTER — Encounter: Payer: Self-pay | Admitting: Family Medicine

## 2017-06-04 ENCOUNTER — Encounter: Payer: Self-pay | Admitting: Internal Medicine

## 2017-06-04 ENCOUNTER — Encounter (INDEPENDENT_AMBULATORY_CARE_PROVIDER_SITE_OTHER): Payer: Self-pay

## 2017-06-04 VITALS — BP 122/80 | HR 85 | Resp 16 | Ht 64.0 in | Wt 192.0 lb

## 2017-06-04 DIAGNOSIS — G2581 Restless legs syndrome: Secondary | ICD-10-CM | POA: Diagnosis not present

## 2017-06-04 DIAGNOSIS — G4719 Other hypersomnia: Secondary | ICD-10-CM

## 2017-06-04 NOTE — Patient Instructions (Addendum)
Will check serum ferritin level, if low will increase iron supplementation. Will follow up with you by phone.   Call us back when you are on a more regular sleep schedule and we can see you back at that time.

## 2017-06-04 NOTE — Progress Notes (Signed)
[facility] Consultation      Assessment and Plan:  Excessive daytime sleepiness.  --Patient was sent for a sleep study which showed mostly spontaneous arousals, and addition it was severe that she had restless leg type findings not significantly associated with arousals. -Currently the patient has a newborn baby which wakes up frequently at night, we discussed that this is likely the cause of her daytime sleepiness at this time.  Once her baby is on a more routine schedule we can reassess her sleep habits and whether she might benefit from a nocturnal medication to help her sleep through the night.  Restless leg syndrome. -Patient has symptoms consistent with restless leg syndrome, she is already on iron supplementation. - We will check a serum ferritin level, if low will consider increasing iron supplementation.   Nicotine abuse.  --She has quit smoking.  Depression. -Currently on fluoxetine, no significant REM sleep suppression was seen on sleep study.  Orders Placed This Encounter  Procedures  . Ferritin   Return if symptoms worsen or fail to improve.    Date: 06/04/2017  MRN# [id number] Alexandria [name] 1985/12/02  Referring Physician: Dr. lada for snoring.   Alexandria [name] is a 32 y.o. old female seen in consultation for chief complaint of:    Chief Complaint  Patient presents with  . Follow-up    sleep study was neg but patient wants to discuss RLS.    HPI:   She has been feeling tired, she goes to bed between 9 to 11 pm, usually wakes up between 6 and 7 am.  When wakes in am, feels tired, but this has been better since quitting smoking.  She was sent for sleep study which showed predominantly spontaneous arousals with an arousal index of 5/h, she was noted to have restless leg syndrome without significant arousals, she achieved adequate sleep time in REM stage during the sleep study.  She has occasional symptoms of RLS when going to bed, which  were worse when she was pregnant.  She continues to have daytime sleepiness, she is waking frequently at night, her husband does the feedings.    Medication:   Reviewed, includes prozac.    Allergies:  Patient has no known allergies.  Review of Systems: Gen:  Denies  fever, sweats, chills HEENT: Denies blurred vision, double vision. bleeds, sore throat Cvc:  No dizziness, chest pain. Resp:   Denies cough or sputum production, shortness of breath Gi: Denies swallowing difficulty, stomach pain. Gu:  Denies bladder incontinence, burning urine Ext:   No Joint pain, stiffness. Skin: No skin rash,  hives  Endoc:  No polyuria, polydipsia. Psych: No depression, insomnia. Other:  All other systems were reviewed with the patient and were negative other that what is mentioned in the HPI.   Physical Examination:   VS: BP 122/80 (BP Location: Left Arm, Cuff Size: Normal)   Pulse 85   Resp 16   Ht 5\' 4"  (1.626 m)   Wt 192 lb (87.1 kg)   SpO2 94%   BMI 32.96 kg/m   General Appearance: No distress  Neuro:without focal findings,  speech normal,  HEENT: PERRLA, EOM intact.   Pulmonary: normal breath sounds, No wheezing.  CardiovascularNormal S1,S2.  No m/r/g.   Abdomen: Benign, Soft, non-tender. Renal:  No costovertebral tenderness  GU:  No performed at this time. Endoc: No evident thyromegaly, no signs of acromegaly. Skin:   warm, no rashes, no ecchymosis  Extremities: normal, no cyanosis, clubbing.  Other findings:    LABORATORY PANEL:   CBC No results for input(s): WBC, HGB, HCT, PLT in the last 168 hours. ------------------------------------------------------------------------------------------------------------------  Chemistries  No results for input(s): NA, K, CL, CO2, GLUCOSE, BUN, CREATININE, CALCIUM, MG, AST, ALT, ALKPHOS, BILITOT in the last 168 hours.  Invalid input(s):  GFRCGP ------------------------------------------------------------------------------------------------------------------  Cardiac Enzymes No results for input(s): TROPONINI in the last 168 hours. ------------------------------------------------------------  RADIOLOGY:  No results found.     Thank  you for the consultation and for allowing [facility] to assist in the care of your patient. Our recommendations are noted above.  Please contact us if we can be of further service.   Marda Stalker, MD.  Board Certified in Internal Medicine, Pulmonary Medicine, [place], and Sleep Medicine.  [facility] Office Number: [phone]  Patricia Pesa, M.D.  Merton Border, M.D  06/04/2017

## 2017-06-09 ENCOUNTER — Encounter: Payer: Self-pay | Admitting: *Deleted

## 2017-06-11 ENCOUNTER — Encounter: Payer: Self-pay | Admitting: Certified Nurse Midwife

## 2017-06-15 NOTE — Telephone Encounter (Signed)
Pt called to ask about referral, she called [facility] and they told her that it has not been faxed over yet. Please fax referral to [phone] cb for pt is [phone]

## 2017-06-15 NOTE — Telephone Encounter (Signed)
The referral has been re-faxed to [facility] at [phone].

## 2017-07-09 ENCOUNTER — Ambulatory Visit: Payer: Self-pay | Admitting: Family Medicine

## 2017-07-28 ENCOUNTER — Ambulatory Visit: Payer: Self-pay | Admitting: Family Medicine

## 2017-08-07 ENCOUNTER — Encounter: Payer: Self-pay | Admitting: Family Medicine

## 2017-08-07 ENCOUNTER — Ambulatory Visit: Payer: Medicaid Other | Admitting: Family Medicine

## 2017-08-07 VITALS — BP 100/60 | HR 90 | Temp 98.2°F | Resp 14 | Ht 64.0 in | Wt 206.6 lb

## 2017-08-07 DIAGNOSIS — E669 Obesity, unspecified: Secondary | ICD-10-CM | POA: Diagnosis not present

## 2017-08-07 DIAGNOSIS — K5900 Constipation, unspecified: Secondary | ICD-10-CM | POA: Diagnosis not present

## 2017-08-07 DIAGNOSIS — E041 Nontoxic single thyroid nodule: Secondary | ICD-10-CM

## 2017-08-07 DIAGNOSIS — R61 Generalized hyperhidrosis: Secondary | ICD-10-CM

## 2017-08-07 DIAGNOSIS — Z0000 Encounter for general adult medical examination without abnormal findings: Secondary | ICD-10-CM

## 2017-08-07 DIAGNOSIS — Z0001 Encounter for general adult medical examination with abnormal findings: Secondary | ICD-10-CM

## 2017-08-07 DIAGNOSIS — F339 Major depressive disorder, recurrent, unspecified: Secondary | ICD-10-CM | POA: Diagnosis not present

## 2017-08-07 DIAGNOSIS — Z800 Family history of malignant neoplasm of digestive organs: Secondary | ICD-10-CM | POA: Diagnosis not present

## 2017-08-07 DIAGNOSIS — D069 Carcinoma in situ of cervix, unspecified: Secondary | ICD-10-CM

## 2017-08-07 DIAGNOSIS — K921 Melena: Secondary | ICD-10-CM | POA: Diagnosis not present

## 2017-08-07 LAB — COMPLETE METABOLIC PANEL WITH GFR
AG Ratio: 1.6 (calc) (ref 1.0–2.5)
ALBUMIN MSPROF: 4.3 g/dL (ref 3.6–5.1)
ALKALINE PHOSPHATASE (APISO): 105 U/L (ref 33–115)
ALT: 18 U/L (ref 6–29)
AST: 19 U/L (ref 10–30)
BUN: 14 mg/dL (ref 7–25)
CALCIUM: 9.5 mg/dL (ref 8.6–10.2)
CO2: 28 mmol/L (ref 20–32)
CREATININE: 0.73 mg/dL (ref 0.50–1.10)
Chloride: 104 mmol/L (ref 98–110)
GFR, EST AFRICAN AMERICAN: 129 mL/min/{1.73_m2} (ref >=60)
GFR, EST NON AFRICAN AMERICAN: 111 mL/min/{1.73_m2} (ref >=60)
GLUCOSE: 87 mg/dL (ref 65–139)
Globulin: 2.7 g/dL (calc) (ref 1.9–3.7)
Potassium: 4.2 mmol/L (ref 3.5–5.3)
Sodium: 138 mmol/L (ref 135–146)
TOTAL PROTEIN: 7 g/dL (ref 6.1–8.1)
Total Bilirubin: 0.3 mg/dL (ref 0.2–1.2)

## 2017-08-07 LAB — CBC WITH DIFFERENTIAL/PLATELET
BASOS PCT: 0.5 %
Basophils Absolute: 47 cells/uL (ref 0–200)
EOS PCT: 4.3 %
Eosinophils Absolute: 404 cells/uL (ref 15–500)
HEMATOCRIT: 39.4 % (ref 35.0–45.0)
HEMOGLOBIN: 13.9 g/dL (ref 11.7–15.5)
LYMPHS ABS: 3497 {cells}/uL (ref 850–3900)
MCH: 28.8 pg (ref 27.0–33.0)
MCHC: 35.3 g/dL (ref 32.0–36.0)
MCV: 81.7 fL (ref 80.0–100.0)
MPV: 9.4 fL (ref 7.5–12.5)
Monocytes Relative: 7.4 %
NEUTROS ABS: 4756 {cells}/uL (ref 1500–7800)
Neutrophils Relative %: 50.6 %
Platelets: 341 10*3/uL (ref 140–400)
RBC: 4.82 10*6/uL (ref 3.80–5.10)
RDW: 13 % (ref 11.0–15.0)
Total Lymphocyte: 37.2 %
WBC mixed population: 696 cells/uL (ref 200–950)
WBC: 9.4 10*3/uL (ref 3.8–10.8)

## 2017-08-07 LAB — TSH: TSH: 1.95 mIU/L

## 2017-08-07 LAB — LIPID PANEL
CHOL/HDL RATIO: 3.7 (calc) (ref <5.0)
CHOLESTEROL: 242 mg/dL — AB (ref <200)
HDL: 66 mg/dL (ref >50)
LDL CHOLESTEROL (CALC): 135 mg/dL — AB
NON-HDL CHOLESTEROL (CALC): 176 mg/dL — AB (ref <130)
Triglycerides: 265 mg/dL — ABNORMAL HIGH (ref <150)

## 2017-08-07 MED ORDER — FLUOXETINE HCL 20 MG PO CAPS: 60.0000 mg | ORAL_CAPSULE | Freq: Every day | ORAL | 6 refills | Status: DC

## 2017-08-07 MED ORDER — BUSPIRONE HCL 15 MG PO TABS: 15.0000 mg | ORAL_TABLET | Freq: Two times a day (BID) | ORAL | 3 refills | Status: DC

## 2017-08-07 NOTE — Assessment & Plan Note (Signed)
See AVS

## 2017-08-07 NOTE — Patient Instructions (Addendum)
Please call [phone] to schedule your imaging test Please wait 2-3 days after the order has been placed to call and get your test scheduled  Check out the information at [facility].org entitled "Nutrition for Weight Loss: What You Need to Know about Fad Diets" Try to lose between 1-2 pounds per week by taking in fewer calories and burning off more calories You can succeed by limiting portions, limiting foods dense in calories and fat, becoming more active, and drinking 8 glasses of water a day (64 ounces) Don't skip meals, especially breakfast, as skipping meals may alter your metabolism Do not use over-the-counter weight loss pills or gimmicks that claim rapid weight loss A healthy BMI (or body mass index) is between 18.5 and 24.9 You can calculate your ideal BMI at the [place] website [employer]fr  Let's get labs today If you have not heard anything from my staff in a week about any orders/referrals/studies from today, please contact us here to follow-up [phone]  Health Maintenance, Female Adopting a healthy lifestyle and getting preventive care can go a long way to promote health and wellness. Talk with your health care provider about what schedule of regular examinations is right for you. This is a good chance for you to check in with your provider about disease prevention and staying healthy. In between checkups, there are plenty of things you can do on your own. Experts have done a lot of research about which lifestyle changes and preventive measures are most likely to keep you healthy. Ask your health care provider for more information. Weight and diet Eat a healthy diet  Be sure to include plenty of vegetables, fruits, low-fat dairy products, and lean protein.  Do not eat a lot of foods high in solid fats, added sugars, or salt.  Get regular exercise. This is one of the most important things you can do for your  health. ? Most adults should exercise for at least 150 minutes each week. The exercise should increase your heart rate and make you sweat (moderate-intensity exercise). ? Most adults should also do strengthening exercises at least twice a week. This is in addition to the moderate-intensity exercise.  Maintain a healthy weight  Body mass index (BMI) is a measurement that can be used to identify possible weight problems. It estimates body fat based on height and weight. Your health care provider can help determine your BMI and help you achieve or maintain a healthy weight.  For females 27 years of age and older: ? A BMI below 18.5 is considered underweight. ? A BMI of 18.5 to 24.9 is normal. ? A BMI of 25 to 29.9 is considered overweight. ? A BMI of 30 and above is considered obese.  Watch levels of cholesterol and blood lipids  You should start having your blood tested for lipids and cholesterol at 30 years of age, then have this test every 5 years.  You may need to have your cholesterol levels checked more often if: ? Your lipid or cholesterol levels are high. ? You are older than 30 years of age. ? You are at high risk for heart disease.  Cancer screening Lung Cancer  Lung cancer screening is recommended for adults 35-24 years old who are at high risk for lung cancer because of a history of smoking.  A yearly low-dose CT scan of the lungs is recommended for people who: ? Currently smoke. ? Have quit within the past 15 years. ? Have at least a 30-pack-year history  of smoking. A pack year is smoking an average of one pack of cigarettes a day for 1 year.  Yearly screening should continue until it has been 15 years since you quit.  Yearly screening should stop if you develop a health problem that would prevent you from having lung cancer treatment.  Breast Cancer  Practice breast self-awareness. This means understanding how your breasts normally appear and feel.  It also means  doing regular breast self-exams. Let your health care provider know about any changes, no matter how small.  If you are in your 20s or 30s, you should have a clinical breast exam (CBE) by a health care provider every 1-3 years as part of a regular health exam.  If you are 36 or older, have a CBE every year. Also consider having a breast X-ray (mammogram) every year.  If you have a family history of breast cancer, talk to your health care provider about genetic screening.  If you are at high risk for breast cancer, talk to your health care provider about having an MRI and a mammogram every year.  Breast cancer gene (BRCA) assessment is recommended for women who have family members with BRCA-related cancers. BRCA-related cancers include: ? Breast. ? Ovarian. ? Tubal. ? Peritoneal cancers.  Results of the assessment will determine the need for genetic counseling and BRCA1 and BRCA2 testing.  Cervical Cancer Your health care provider may recommend that you be screened regularly for cancer of the pelvic organs (ovaries, uterus, and vagina). This screening involves a pelvic examination, including checking for microscopic changes to the surface of your cervix (Pap test). You may be encouraged to have this screening done every 3 years, beginning at age 2.  For women ages 57-65, health care providers may recommend pelvic exams and Pap testing every 3 years, or they may recommend the Pap and pelvic exam, combined with testing for human papilloma virus (HPV), every 5 years. Some types of HPV increase your risk of cervical cancer. Testing for HPV may also be done on women of any age with unclear Pap test results.  Other health care providers may not recommend any screening for nonpregnant women who are considered low risk for pelvic cancer and who do not have symptoms. Ask your health care provider if a screening pelvic exam is right for you.  If you have had past treatment for cervical cancer or a  condition that could lead to cancer, you need Pap tests and screening for cancer for at least 20 years after your treatment. If Pap tests have been discontinued, your risk factors (such as having a new sexual partner) need to be reassessed to determine if screening should resume. Some women have medical problems that increase the chance of getting cervical cancer. In these cases, your health care provider may recommend more frequent screening and Pap tests.  Colorectal Cancer  This type of cancer can be detected and often prevented.  Routine colorectal cancer screening usually begins at 30 years of age and continues through 30 years of age.  Your health care provider may recommend screening at an earlier age if you have risk factors for colon cancer.  Your health care provider may also recommend using home test kits to check for hidden blood in the stool.  A small camera at the end of a tube can be used to examine your colon directly (sigmoidoscopy or colonoscopy). This is done to check for the earliest forms of colorectal cancer.  Routine screening usually begins  at age 49.  Direct examination of the colon should be repeated every 5-10 years through 30 years of age. However, you may need to be screened more often if early forms of precancerous polyps or small growths are found.  Skin Cancer  Check your skin from head to toe regularly.  Tell your health care provider about any new moles or changes in moles, especially if there is a change in a mole's shape or color.  Also tell your health care provider if you have a mole that is larger than the size of a pencil eraser.  Always use sunscreen. Apply sunscreen liberally and repeatedly throughout the day.  Protect yourself by wearing long sleeves, pants, a wide-brimmed hat, and sunglasses whenever you are outside.  Heart disease, diabetes, and high blood pressure  High blood pressure causes heart disease and increases the risk of stroke.  High blood pressure is more likely to develop in: ? People who have blood pressure in the high end of the normal range (130-139/85-89 mm Hg). ? People who are overweight or obese. ? People who are African American.  If you are 68-38 years of age, have your blood pressure checked every 3-5 years. If you are 67 years of age or older, have your blood pressure checked every year. You should have your blood pressure measured twice-once when you are at a hospital or clinic, and once when you are not at a hospital or clinic. Record the average of the two measurements. To check your blood pressure when you are not at a hospital or clinic, you can use: ? An automated blood pressure machine at a pharmacy. ? A home blood pressure monitor.  If you are between 59 years and 20 years old, ask your health care provider if you should take aspirin to prevent strokes.  Have regular diabetes screenings. This involves taking a blood sample to check your fasting blood sugar level. ? If you are at a normal weight and have a low risk for diabetes, have this test once every three years after 30 years of age. ? If you are overweight and have a high risk for diabetes, consider being tested at a younger age or more often. Preventing infection Hepatitis B  If you have a higher risk for hepatitis B, you should be screened for this virus. You are considered at high risk for hepatitis B if: ? You were born in a country where hepatitis B is common. Ask your health care provider which countries are considered high risk. ? Your parents were born in a high-risk country, and you have not been immunized against hepatitis B (hepatitis B vaccine). ? You have HIV or AIDS. ? You use needles to inject street drugs. ? You live with someone who has hepatitis B. ? You have had sex with someone who has hepatitis B. ? You get hemodialysis treatment. ? You take certain medicines for conditions, including cancer, organ transplantation, and  autoimmune conditions.  Hepatitis C  Blood testing is recommended for: ? Everyone born from [birth year] through 1963. ? Anyone with known risk factors for hepatitis C.  Sexually transmitted infections (STIs)  You should be screened for sexually transmitted infections (STIs) including gonorrhea and chlamydia if: ? You are sexually active and are younger than 30 years of age. ? You are older than 30 years of age and your health care provider tells you that you are at risk for this type of infection. ? Your sexual activity has changed since you  were last screened and you are at an increased risk for chlamydia or gonorrhea. Ask your health care provider if you are at risk.  If you do not have HIV, but are at risk, it may be recommended that you take a prescription medicine daily to prevent HIV infection. This is called pre-exposure prophylaxis (PrEP). You are considered at risk if: ? You are sexually active and do not regularly use condoms or know the HIV status of your partner(s). ? You take drugs by injection. ? You are sexually active with a partner who has HIV.  Talk with your health care provider about whether you are at high risk of being infected with HIV. If you choose to begin PrEP, you should first be tested for HIV. You should then be tested every 3 months for as long as you are taking PrEP. Pregnancy  If you are premenopausal and you may become pregnant, ask your health care provider about preconception counseling.  If you may become pregnant, take 400 to 800 micrograms (mcg) of folic acid every day.  If you want to prevent pregnancy, talk to your health care provider about birth control (contraception). Osteoporosis and menopause  Osteoporosis is a disease in which the bones lose minerals and strength with aging. This can result in serious bone fractures. Your risk for osteoporosis can be identified using a bone density scan.  If you are 9 years of age or older, or if you are at  risk for osteoporosis and fractures, ask your health care provider if you should be screened.  Ask your health care provider whether you should take a calcium or vitamin D supplement to lower your risk for osteoporosis.  Menopause may have certain physical symptoms and risks.  Hormone replacement therapy may reduce some of these symptoms and risks. Talk to your health care provider about whether hormone replacement therapy is right for you. Follow these instructions at home:  Schedule regular health, dental, and eye exams.  Stay current with your immunizations.  Do not use any tobacco products including cigarettes, chewing tobacco, or electronic cigarettes.  If you are pregnant, do not drink alcohol.  If you are breastfeeding, limit how much and how often you drink alcohol.  Limit alcohol intake to no more than 1 drink per day for nonpregnant women. One drink equals 12 ounces of beer, 5 ounces of wine, or 1 ounces of hard liquor.  Do not use street drugs.  Do not share needles.  Ask your health care provider for help if you need support or information about quitting drugs.  Tell your health care provider if you often feel depressed.  Tell your health care provider if you have ever been abused or do not feel safe at home. This information is not intended to replace advice given to you by your health care provider. Make sure you discuss any questions you have with your health care provider. Document Released: 10/28/2010 Document Revised: 09/20/2015 Document Reviewed: 01/16/2015 Elsevier Interactive Patient Education  [employer].

## 2017-08-07 NOTE — Assessment & Plan Note (Signed)
Check labs and [place]

## 2017-08-07 NOTE — Assessment & Plan Note (Signed)
Patient does not want to change dose; wishes to continue same medicines; denies SI/HI today

## 2017-08-07 NOTE — Assessment & Plan Note (Signed)
Seen by derm; will be checking thyroid labs to see if involved

## 2017-08-07 NOTE — Progress Notes (Signed)
BP 100/60 (BP Location: Left Arm, Patient Position: Sitting, Cuff Size: Normal)   Pulse 90   Temp 98.2 F (36.8 C) (Oral)   Resp 14   Ht '5\' 4"'$  (1.626 m)   Wt 206 lb 9.6 oz (93.7 kg)   SpO2 98%   BMI 35.46 kg/m    Subjective:    Patient ID: Alexandria [name], female    DOB: [birth year], 32 y.o.   MRN: [id number]  HPI: Alexandria [name] is a 32 y.o. female  Chief Complaint  Patient presents with  . Annual Exam  . Thyroid Problem    HPI  Patient was booked as "follow-up pap smear"; however, she had a baby since last visit and she has had CIN III; sees OB-GYN She has had a thyroid nodule; she is having excessive sweating; can feel the nodule and has a lot of anxiety; has depression; dry skin; neck itches like crazy and all of the skin itches; going on for a while Saw dermatologist for the excessive sweating, which works for the underarms; now sweats everywhere; put it on the skin, but not over the throat; saw someone in [place] and had a biopsy of it; about 5 years ago Feels like throat is swollen and a little noticeable when swallowing; voice has been raspy If she doesn't take a laxative and fiber in her water every day, she gets constipated; was seeing blood in the stool; supposed to start getting colonoscopies before 51 because her mother was diagnosed at age 71; she will start at 61; had some blood in her stool just after having a hard stool; she has not had a hard stool for 3 weeks and has not seen any more blood; exercising 4 times a week She also has a yeast infection Wonders if she needs a refill back to her derm (I told her to contact derm since she is already established) Mood issues; she denies SI/HI; wants to stay on the same doses of medicines; feels anxious, but thinks that might actually be her thyroid; she says that her OB started her on the medicine but her primary needed to continue it; she does not want to change the dose; just wants refills  USPSTF grade A and B  recommendations Depression:  Depression screen [facility] 2/9 08/07/2017 10/15/2016 07/14/2016 05/26/2016 10/18/2015  Decreased Interest 0 0 0 0 0  Down, Depressed, Hopeless 0 0 0 1 0  PHQ - 2 Score 0 0 0 1 0  Altered sleeping 2 - - - -  Tired, decreased energy 1 - - - -  Change in appetite 0 - - - -  Feeling bad or failure about yourself  0 - - - -  Trouble concentrating 1 - - - -  Moving slowly or fidgety/restless 0 - - - -  Suicidal thoughts 0 - - - -  PHQ-9 Score 4 - - - -  Difficult doing work/chores Not difficult at all - - - -   Hypertension: BP Readings from Last 3 Encounters:  08/07/17 100/60  06/04/17 122/80  06/01/17 102/72   Obesity: Wt Readings from Last 3 Encounters:  08/07/17 206 lb 9.6 oz (93.7 kg)  06/04/17 192 lb (87.1 kg)  06/01/17 196 lb (88.9 kg)   BMI Readings from Last 3 Encounters:  08/07/17 35.46 kg/m  06/04/17 32.96 kg/m  06/01/17 33.64 kg/m    Skin cancer: saw dermatologist for lesion removal on the left arm; paying attention to herself Lung cancer:  Quit 2  years ago Breast cancer: no lumps Colorectal cancer: discussed, will start at age 83, unless GI wants to do sooner Cervical cancer screening: through GYN since abnormal BRCA gene screening: family hx of breast and/or ovarian cancer and/or metastatic prostate cancer? no HIV, hep B, hep C: already done STD testing and prevention (chl/gon/syphilis): already done Intimate partner violence: no abuse Contraception: "I don't really have time right now" for sex; has the pill at pharmacy to pick up Osteoporosis: n/a Fall prevention/vitamin D: discussed, spend time outdoors, not in [place] Immunizations: UTD Diet: pretty good eater Exercise: 4x a week Alcohol: no Tobacco use: quit 2 years ago AAA: n/a Aspirin:n/a Glucose:  Glucose, Bld  Date Value Ref Range Status  11/27/2016 87 65 - 99 mg/dL Final  05/26/2016 84 65 - 99 mg/dL Final  08/01/2007 109 (H)  Final   Lipids:  Lab Results  Component  Value Date   CHOL 192 05/26/2016   Lab Results  Component Value Date   HDL 58 05/26/2016   Lab Results  Component Value Date   LDLCALC 102 (H) 05/26/2016   Lab Results  Component Value Date   TRIG 161 (H) 05/26/2016   Lab Results  Component Value Date   CHOLHDL 3.3 05/26/2016   No results found for: LDLDIRECT   Depression screen [facility] 2/9 08/07/2017 10/15/2016 07/14/2016 05/26/2016 10/18/2015  Decreased Interest 0 0 0 0 0  Down, Depressed, Hopeless 0 0 0 1 0  PHQ - 2 Score 0 0 0 1 0  Altered sleeping 2 - - - -  Tired, decreased energy 1 - - - -  Change in appetite 0 - - - -  Feeling bad or failure about yourself  0 - - - -  Trouble concentrating 1 - - - -  Moving slowly or fidgety/restless 0 - - - -  Suicidal thoughts 0 - - - -  PHQ-9 Score 4 - - - -  Difficult doing work/chores Not difficult at all - - - -    Relevant past medical, surgical, family and social history reviewed Past Medical History:  Diagnosis Date  . Anxiety   . CIN III (cervical intraepithelial neoplasia III)    2011 or 2012-LEEP  . Depression   . High risk HPV infection    followed by GYN  . Multinodular goiter    Past Surgical History:  Procedure Laterality Date  . 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 CERVICAL BIOPSY  W/ LOOP ELECTRODE EXCISION  2014   [facility] Dept  . COLPOSCOPY  06/15/2012   [facility], Dr Ardell Isaacs: CIN III   Family History  Problem Relation Age of Onset  . Insomnia Mother   . Colon cancer Mother 35       colon cancer  . Cervical cancer Maternal Aunt        Cervical Cancer  . Colon cancer Paternal Aunt        Colon cancer  . Asthma Brother   . Diabetes Maternal Grandmother   . Diabetes Maternal Grandfather    Social History   Tobacco Use  . Smoking status: Former Research scientist (life sciences)  . Smokeless tobacco: Never Used  Substance Use Topics  . Alcohol use: No    Alcohol/week: 0.0 oz  . Drug use: No    Interim medical history since last  visit reviewed. Allergies and medications reviewed  Review of Systems Per HPI unless specifically indicated above     Objective:  BP 100/60 (BP Location: Left Arm, Patient Position: Sitting, Cuff Size: Normal)   Pulse 90   Temp 98.2 F (36.8 C) (Oral)   Resp 14   Ht '5\' 4"'$  (1.626 m)   Wt 206 lb 9.6 oz (93.7 kg)   SpO2 98%   BMI 35.46 kg/m   Wt Readings from Last 3 Encounters:  08/07/17 206 lb 9.6 oz (93.7 kg)  06/04/17 192 lb (87.1 kg)  06/01/17 196 lb (88.9 kg)    Physical Exam  Constitutional: She appears well-developed and well-nourished.  HENT:  Head: Normocephalic and atraumatic.  Right Ear: Hearing, tympanic membrane, external ear and ear canal normal.  Left Ear: Hearing, tympanic membrane, external ear and ear canal normal.  Eyes: Conjunctivae and EOM are normal. Right eye exhibits no hordeolum. Left eye exhibits no hordeolum. No scleral icterus.  Neck: Carotid bruit is not present. No thyroid mass (palpable fullness right side near SCD) and no thyromegaly present.  Cardiovascular: Normal rate, regular rhythm, S1 normal, S2 normal and normal heart sounds.  No extrasystoles are present.  Pulmonary/Chest: Effort normal and breath sounds normal. No respiratory distress.  Abdominal: Soft. Normal appearance and bowel sounds are normal. She exhibits no distension, no abdominal bruit, no pulsatile midline mass and no mass. There is no hepatosplenomegaly. There is no tenderness. No hernia.  Musculoskeletal: Normal range of motion. She exhibits no edema.  Lymphadenopathy:       Head (right side): No submandibular adenopathy present.       Head (left side): No submandibular adenopathy present.    She has no cervical adenopathy.    She has no axillary adenopathy.  Neurological: She is alert. She displays no tremor. No cranial nerve deficit. She exhibits normal muscle tone. Gait normal.  Reflex Scores:      Patellar reflexes are 1+ on the right side and 1+ on the left  side. Skin: Skin is warm and dry. No bruising and no ecchymosis noted. No cyanosis. No pallor.  Psychiatric: Her speech is normal and behavior is normal. Thought content normal. Her mood appears not anxious. She does not exhibit a depressed mood.    Results for orders placed or performed in visit on 06/01/17  TSH  Result Value Ref Range   TSH 1.500 0.450 - 4.500 uIU/mL      Assessment & Plan:   Problem List Items Addressed This Visit      Endocrine   Thyroid nodule    Check labs and [place]      Relevant Orders   US THYROID     Musculoskeletal and Integument   Hyperhidrosis    Seen by derm; will be checking thyroid labs to see if involved        Genitourinary   CIN III (cervical intraepithelial neoplasia grade III) with severe dysplasia    Patient really should f/u with GYN for her pap smears; referral made today      Relevant Orders   Ambulatory referral to Obstetrics / Gynecology     Other   Obesity (BMI 35.0-39.9 without comorbidity)    See AVS      Depression, recurrent ([place])    Patient does not want to change dose; wishes to continue same medicines; denies SI/HI today      Relevant Medications   FLUoxetine (PROZAC) 20 MG capsule   busPIRone (BUSPAR) 15 MG tablet    Other Visit Diagnoses    Preventative health care    -  Primary   Relevant Orders  CBC with Differential/Platelet   COMPLETE METABOLIC PANEL WITH GFR   Lipid panel   TSH   Blood in stool       Relevant Orders   Ambulatory referral to Gastroenterology   Family history of colon cancer in mother       Relevant Orders   Ambulatory referral to Gastroenterology   Constipation, unspecified constipation type       Relevant Orders   Ambulatory referral to Gastroenterology       Follow up plan: Return in about 1 year (around 08/08/2018) for complete physical; 3 months for regular follow-up, prescriptions.  An after-visit summary was printed and given to the patient at [facility].  Please see the  patient instructions which may contain other information and recommendations beyond what is mentioned above in the assessment and plan.  Meds ordered this encounter  Medications  . FLUoxetine (PROZAC) 20 MG capsule    Sig: Take 3 capsules (60 mg total) by mouth daily. (for mood; stop escitalopram)    Dispense:  90 capsule    Refill:  6  . busPIRone (BUSPAR) 15 MG tablet    Sig: Take 1 tablet (15 mg total) by mouth 2 (two) times daily.    Dispense:  60 tablet    Refill:  3    Orders Placed This Encounter  Procedures  . US THYROID  . CBC with Differential/Platelet  . COMPLETE METABOLIC PANEL WITH GFR  . Lipid panel  . TSH  . Ambulatory referral to Obstetrics / Gynecology  . Ambulatory referral to Gastroenterology

## 2017-08-07 NOTE — Assessment & Plan Note (Signed)
Patient really should f/u with GYN for her pap smears; referral made today

## 2017-08-08 ENCOUNTER — Encounter: Payer: Self-pay | Admitting: Family Medicine

## 2017-08-11 ENCOUNTER — Other Ambulatory Visit: Payer: Self-pay

## 2017-08-11 DIAGNOSIS — R61 Generalized hyperhidrosis: Secondary | ICD-10-CM

## 2017-08-13 ENCOUNTER — Ambulatory Visit: Payer: Medicaid Other

## 2017-08-13 ENCOUNTER — Telehealth: Payer: Self-pay

## 2017-08-13 NOTE — Telephone Encounter (Signed)
Left detailed voicemail to call and reschedule.

## 2017-08-13 NOTE — Telephone Encounter (Signed)
Please encourage patient to reschedule

## 2017-08-13 NOTE — Telephone Encounter (Signed)
Copied from [place] [phone]. Topic: Inquiry >> Aug 13, 2017  4:05 PM Conception Chancy, NT wrote: Reason for CRM: Alexandria [name] is calling from [place] to inform dr. Sanda Klein of pt not coming to her appt today.

## 2017-08-13 NOTE — Telephone Encounter (Signed)
Copied from [place] [phone]. Topic: Inquiry >> Aug 13, 2017  4:05 PM Conception Chancy, NT wrote: Reason for CRM: Alexandria [name] is calling from [place] to inform dr. Sanda Klein of pt not coming to her appt today.

## 2017-08-19 ENCOUNTER — Encounter: Payer: Self-pay | Admitting: Obstetrics and Gynecology

## 2017-08-19 ENCOUNTER — Ambulatory Visit: Payer: Medicaid Other | Admitting: Obstetrics and Gynecology

## 2017-08-19 VITALS — BP 124/74 | Ht 64.0 in | Wt 210.0 lb

## 2017-08-19 DIAGNOSIS — Z87898 Personal history of other specified conditions: Secondary | ICD-10-CM | POA: Diagnosis not present

## 2017-08-19 DIAGNOSIS — Z8742 Personal history of other diseases of the female genital tract: Secondary | ICD-10-CM

## 2017-08-19 DIAGNOSIS — Z124 Encounter for screening for malignant neoplasm of cervix: Secondary | ICD-10-CM

## 2017-08-19 NOTE — Progress Notes (Signed)
Obstetrics & Gynecology Office Visit   Chief Complaint  Patient presents with  . Referral    Abnormal pap smear history  Referral from Dr. Enid Derry of [place] for the above.   History of Present Illness: 32 y.o. [phone] female who presents in referral from Dr. Enid Derry from [place] for management of the patient's abnormal pap smear history.  In general, the patient does not quite remember her entire pap smear history and only remembers that she has had some that were abnormal.  She believes she may have had a LEEP procedure at some point in the past.  She has no abnormal symptoms of abnormal bleeding today. She has no acute complaints. She is simply here for ongoing management of her pap smears.   Given that the patient did not recollect her pap smear history, I searched throughout Epic for every lab result in the standard location and based on outside records. Further, I looked in [facility]'s old EMR [facility]) and searched for pap smear results by looking for lab results collected by one of the providers at [facility] an all outside records I could find in that system where a pap smear result was documented.  The results of that search are summarized below:  2011-2012: unconfirmed LEEP procedure (see Past Medical History below). I can find no record of this.  This appears to be per patient report. 04/06/2012: ASCUS-HPV +  06/15/12: Colposcopy - CIN 2-3 (LEEP recommended. However, patient became pregnant prior to procedure being performed).  11/09/2012 - LGSIL (pregnant at time) 06/06/2013 - There is a note (I believe it is from [facility] Department) that states that she had a colposcopy on the date listed with the result of CIN 1 on biopsy. I can not find any record of this colposcopy procedure taking place. 10/19/2013: NILM, no HPV tested. The clinic note states explicitly that at this noted date the patient was 8 months postpartum and  had no follow-up from her CIN 2-3 from colposcopy on 06/05/12.   07/17/2014 - NILM, no HPV tested (initial prenatal visit)  03/29/2015 - NILM, no HPV tested (performed at [facility] postpartum). 05/26/2016 - ASCUS, HPV negative - insufficient endocervical component. Patient sent to [facility] for colposcopy/management.  07/11/2016 - NILM, HPV negative (performed by me at referral appointment from Dr. Sanda Klein). Recommended follow up pap smear in one year.  Patient deferred colposcopy given that she may not need colposcopy, if pap normal).   She returns today for this follow up pap smear.    Past Medical History:  Diagnosis Date  . Anxiety   . CIN III (cervical intraepithelial neoplasia III)    2011 or 2012-LEEP  . Depression   . High risk HPV infection    followed by GYN  . Multinodular goiter     Past Surgical History:  Procedure Laterality Date  . 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 CERVICAL BIOPSY  W/ LOOP ELECTRODE EXCISION  2014   [facility] Dept  . COLPOSCOPY  06/15/2012   [facility], Dr Ardell Isaacs: CIN III    Gynecologic History: Patient's last menstrual period was 08/05/2017.  Obstetric History: [id number]  Family History  Problem Relation Age of Onset  . Insomnia Mother   . Colon cancer Mother 2       colon cancer  . Cervical cancer Maternal Aunt        Cervical Cancer  . Colon cancer Paternal Aunt  Colon cancer  . Asthma Brother   . Diabetes Maternal Grandmother   . Diabetes Maternal Grandfather     Social History   Socioeconomic History  . Marital status: Divorced    Spouse name: Freida Busman  . Number of children: 5  . Years of education: Not on file  . Highest education level: Associate degree: occupational, Hotel manager, or vocational program  Occupational History  . Occupation: Delivery driver  Social Needs  . Financial resource strain: Not very hard  . Food insecurity:    Worry: Never true    Inability: Never true  .  Transportation needs:    Medical: No    Non-medical: No  Tobacco Use  . Smoking status: Former Research scientist (life sciences)  . Smokeless tobacco: Never Used  Substance and Sexual Activity  . Alcohol use: No    Alcohol/week: 0.0 oz  . Drug use: No  . Sexual activity: Yes    Partners: Male    Birth control/protection: None  Lifestyle  . Physical activity:    Days per week: 4 days    Minutes per session: 40 min  . Stress: Rather much  Relationships  . Social connections:    Talks on phone: More than three times a week    Gets together: Once a week    Attends religious service: 1 to 4 times per year    Active member of club or organization: No    Attends meetings of clubs or organizations: Never    Relationship status: Divorced  . Intimate partner violence:    Fear of current or ex partner: No    Emotionally abused: No    Physically abused: No    Forced sexual activity: No  Other Topics Concern  . Not on file  Social History Narrative  . Not on file   Allergies: No Known Allergies  Prior to Admission medications   Medication Sig Start Date End Date Taking? Authorizing Provider  busPIRone (BUSPAR) 15 MG tablet Take 1 tablet (15 mg total) by mouth 2 (two) times daily. 08/07/17   Arnetha Courser, MD  ferrous sulfate 325 (65 FE) MG tablet Take 325 mg by mouth daily with breakfast.    [provider]  FLUoxetine (PROZAC) 20 MG capsule Take 3 capsules (60 mg total) by mouth daily. (for mood; stop escitalopram) 08/07/17   Lada, Satira Anis, MD  Prenatal Multivit-Min-Fe-FA (PRENATAL VITAMINS PO) Take by mouth.    [provider]    Review of Systems  Constitutional: Negative.   HENT: Negative.   Eyes: Negative.   Respiratory: Negative.   Cardiovascular: Negative.   Gastrointestinal: Negative.   Genitourinary: Negative.   Musculoskeletal: Negative.   Skin: Negative.   Neurological: Negative.   Psychiatric/Behavioral: Negative.      Physical Exam BP 124/74   Ht 5\' 4"  (1.626 m)    Wt 210 lb (95.3 kg)   LMP 08/05/2017   BMI 36.05 kg/m  Patient's last menstrual period was 08/05/2017. Physical Exam  Constitutional: She is oriented to person, place, and time. She appears well-developed and well-nourished. No distress.  Genitourinary: Vagina normal and uterus normal. Pelvic exam was performed with patient supine. There is no rash, tenderness or lesion on the right labia. There is no rash, tenderness or lesion on the left labia. Vagina exhibits no lesion. No erythema, tenderness or bleeding in the vagina. No signs of injury around the vagina. No vaginal discharge found. Right adnexum does not display mass, does not display tenderness and does not  display fullness. Left adnexum does not display mass, does not display tenderness and does not display fullness. Cervix does not exhibit motion tenderness, lesion, polyp or cyanosis.   Uterus is mobile. Uterus is not enlarged, tender or exhibiting a mass.  HENT:  Head: Normocephalic and atraumatic.  Eyes: Conjunctivae are normal. No scleral icterus.  Neck: Normal range of motion. Neck supple.  Cardiovascular: Normal rate and regular rhythm. Exam reveals no gallop and no friction rub.  No murmur heard. Pulmonary/Chest: Effort normal and breath sounds normal. No respiratory distress. She has no wheezes. She has no rales.  Abdominal: Soft. Bowel sounds are normal. She exhibits no distension and no mass. There is no tenderness. There is no rebound and no guarding.  Musculoskeletal: Normal range of motion. She exhibits no edema.  Lymphadenopathy:       Right: No inguinal adenopathy present.       Left: No inguinal adenopathy present.  Neurological: She is alert and oriented to person, place, and time. No cranial nerve deficit.  Skin: Skin is warm and dry. No erythema.  Psychiatric: She has a normal mood and affect. Her behavior is normal. Judgment normal.    Female chaperone present for pelvic and breast  portions of the physical  exam  Assessment: 32 y.o. [id number] female here for  1. Pap smear for cervical cancer screening   2. History of abnormal cervical Pap smear     Plan: Problem List Items Addressed This Visit    None    Visit Diagnoses    Pap smear for cervical cancer screening    -  Primary   Relevant Orders   IGP, Aptima HPV, rfx 16/18,45   History of abnormal cervical Pap smear         I spent a great deal of time looking through every possible record of a pap smear, colposcopy, or excisional cervical procedure that I could find in [place] and in [place] (our old system, not active for [place] since 06/24/2016).  See the history of pap smears that I was able to obtain from above.  I counseled her on the importance of follow up for abnormal pap smears. However, since about 2015 she has had normal pap smears (apart from the ASCUS, HPV negative on 05/26/2016, which is probably OK given the result on 07/11/16).  Obtained pap smear today.  If normal, I would recommend a pap smear in one year with obligate HPV testing (she will be 32 years old).  If that pap smear is also normal, she can return to routine screening.    20 minutes spent in face to face discussion with > 50% spent in counseling,management, and coordination of care of her history of abnormal pap smears.   Return in about 1 year (around 08/20/2018) for Annual Gynecologic Examination with pap smear.   Prentice Docker, MD 08/19/2017  7:38 PM     CC: Arnetha Courser, MD [address] [place],  [id number]

## 2017-08-22 ENCOUNTER — Encounter: Payer: Self-pay | Admitting: Obstetrics and Gynecology

## 2017-08-25 ENCOUNTER — Ambulatory Visit
Admission: RE | Admit: 2017-08-25 | Discharge: 2017-08-25 | Disposition: A | Discharge status: Home / Self Care | Payer: Medicaid Other | Source: Ambulatory Visit | Attending: Family Medicine | Admitting: Family Medicine

## 2017-08-25 DIAGNOSIS — E042 Nontoxic multinodular goiter: Secondary | ICD-10-CM | POA: Insufficient documentation

## 2017-08-25 DIAGNOSIS — E041 Nontoxic single thyroid nodule: Secondary | ICD-10-CM | POA: Diagnosis present

## 2017-08-25 LAB — IGP, APTIMA HPV, RFX 16/18,45
HPV Aptima: POSITIVE — AB
PAP SMEAR COMMENT: 0

## 2017-08-28 ENCOUNTER — Telehealth: Payer: Self-pay | Admitting: Family Medicine

## 2017-08-28 ENCOUNTER — Telehealth: Payer: Self-pay | Admitting: Obstetrics and Gynecology

## 2017-08-28 NOTE — Telephone Encounter (Signed)
Please review thyroid ultrasound not biopsy.

## 2017-08-28 NOTE — Telephone Encounter (Signed)
Patient Is returning missed call . Please advise

## 2017-08-28 NOTE — Telephone Encounter (Signed)
Left generic vm 

## 2017-08-28 NOTE — Telephone Encounter (Signed)
Copied from [place] [phone]. Topic: Quick Communication - See Telephone Encounter >> Aug 28, 2017  3:18 PM Clack, Laban Emperor wrote: CRM for notification. See Telephone encounter for: 08/28/17.  Pt calling for results from her thyroid biopsy.  Please f/u with pt.

## 2017-08-30 NOTE — Telephone Encounter (Signed)
Radiologist recommends rescanning her thyroid gland in one year Encourage her to pay attention to this area; notify us sooner if any growth, change in voice/hoarseness, trouble swallowing, etc. We'll be glad to refer to ENT if changes Thank you

## 2017-08-31 NOTE — Telephone Encounter (Signed)
Pt.notified

## 2017-09-01 NOTE — Telephone Encounter (Signed)
Pt aware via vm that SDJ on vaca this week, but to call me back once she gets this

## 2017-09-08 NOTE — Telephone Encounter (Signed)
Discussed LGSIL, HPV+ results with patient. Recommend colposcopy per ASCCP guidelines. She voiced understanding and agreement with plan.  She will call and schedule.

## 2017-09-08 NOTE — Telephone Encounter (Signed)
Pt still has not returned our calls. Would you like to send a letter?

## 2017-09-16 ENCOUNTER — Ambulatory Visit (INDEPENDENT_AMBULATORY_CARE_PROVIDER_SITE_OTHER): Payer: Medicaid Other | Admitting: Obstetrics and Gynecology

## 2017-09-16 ENCOUNTER — Encounter: Payer: Self-pay | Admitting: Obstetrics and Gynecology

## 2017-09-16 VITALS — BP 98/60 | HR 88 | Ht 64.0 in | Wt 215.0 lb

## 2017-09-16 DIAGNOSIS — R87612 Low grade squamous intraepithelial lesion on cytologic smear of cervix (LGSIL): Secondary | ICD-10-CM | POA: Diagnosis not present

## 2017-09-16 NOTE — Progress Notes (Addendum)
Referring Provider:  Enid Derry, MD  HPI:  Alexandria [name] is a 32 y.o.  [phone]  who presents today for evaluation and management of abnormal cervical cytology.    Dysplasia History:   2011-2012: unconfirmed LEEP procedure (see Past Medical History below). I can find no record of this.  This appears to be per patient report. 04/06/2012: ASCUS-HPV +             06/15/12: Colposcopy - CIN 2-3 (LEEP recommended. However, patient became pregnant prior to procedure being performed).  11/09/2012 - LGSIL (pregnant at time) 06/06/2013 - There is a note (I believe it is from [facility] Department) that states that she had a colposcopy on the date listed with the result of CIN 1 on biopsy. I can not find any record of this colposcopy procedure taking place. 10/19/2013: NILM, no HPV tested. The clinic note states explicitly that at this noted date the patient was 8 months postpartum and had no follow-up from her CIN 2-3 from colposcopy on 06/05/12.   07/17/2014 - NILM, no HPV tested (initial prenatal visit)  03/29/2015 - NILM, no HPV tested (performed at [facility] postpartum). 05/26/2016 - ASCUS, HPV negative - insufficient endocervical component. Patient sent to [facility] for colposcopy/management.  07/11/2016 - NILM, HPV negative (performed by me at referral appointment from Dr. Sanda Klein). Recommended follow up pap smear in one year.  Patient deferred colposcopy given that she may not need colposcopy, if pap normal).  08/19/2017 - LGSIL, HPV +  OB History  Gravida Para Term Preterm AB Living  7 5 5   2 5   SAB TAB Ectopic Multiple Live Births  1 1   0 5    # Outcome Date GA Lbr Len/2nd Weight Sex Delivery Anes PTL Lv  7 Term 04/21/17 [redacted]w[redacted]d / 25:41 7 lb 3.3 oz (3.27 kg) M Vag-Spont EPI  LIV  6 Term 02/07/15 [redacted]w[redacted]d / 00:15 7 lb 14.6 oz (3.59 kg) M Vag-Spont EPI  LIV  5 Term 03/23/13 [redacted]w[redacted]d 00:25 / 00:13 8 lb 5.7 oz (3.79 kg) M Vag-Spont EPI  LIV  4 Term 09/17/08 [redacted]w[redacted]d  7 lb 12 oz (3.515 kg) M  Vag-Spont EPI    3 Term 08/27/06 [redacted]w[redacted]d  7 lb 12 oz (3.515 kg) M Vag-Spont EPI    2 SAB           1 TAB             Past Medical History:  Diagnosis Date  . Anxiety   . CIN III (cervical intraepithelial neoplasia III)    2011 or 2012-LEEP  . Depression   . High risk HPV infection    followed by GYN  . Multinodular goiter     Past Surgical History:  Procedure Laterality Date  . 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 CERVICAL BIOPSY  W/ LOOP ELECTRODE EXCISION  2014   [facility] Dept  . COLPOSCOPY  06/15/2012   [facility], Dr Ardell Isaacs: CIN III    SOCIAL HISTORY:  Social History   Substance and Sexual Activity  Alcohol Use No  . Alcohol/week: 0.0 oz    Social History   Substance and Sexual Activity  Drug Use No     Family History  Problem Relation Age of Onset  . Insomnia Mother   . Colon cancer Mother 63       colon cancer  . Cervical cancer Maternal Aunt  Cervical Cancer  . Colon cancer Paternal Aunt        Colon cancer  . Asthma Brother   . Diabetes Maternal Grandmother   . Diabetes Maternal Grandfather     ALLERGIES:  Patient has no known allergies.  Current Outpatient Medications on File Prior to Visit  Medication Sig Dispense Refill  . busPIRone (BUSPAR) 15 MG tablet Take 1 tablet (15 mg total) by mouth 2 (two) times daily. 60 tablet 3  . ferrous sulfate 325 (65 FE) MG tablet Take 325 mg by mouth daily with breakfast.    . FLUoxetine (PROZAC) 20 MG capsule Take 3 capsules (60 mg total) by mouth daily. (for mood; stop escitalopram) 90 capsule 6  . norethindrone (MICRONOR,CAMILA,ERRIN) 0.35 MG tablet Take 1 tablet (0.35 mg total) by mouth daily. (Patient not taking: Reported on 08/07/2017) 1 Package 11  . Prenatal Multivit-Min-Fe-FA (PRENATAL VITAMINS PO) Take by mouth.     No current facility-administered medications on file prior to visit.     Physical Exam: -Vitals:  BP 98/60 (BP Location: Left Arm,  Patient Position: Sitting, Cuff Size: Large)   Pulse 88   Ht 5\' 4"  (1.626 m)   Wt 215 lb (97.5 kg)   LMP 09/02/2017   Breastfeeding? Yes   BMI 36.90 kg/m  GEN: WD, WN, NAD.  A+ O x 3, good mood and affect. ABD:  NT, ND.  Soft, no masses.  No hernias noted.   Pelvic:   Vulva: Normal appearance.  No lesions.  Vagina: No lesions or abnormalities noted.  Support: Normal pelvic support.  Urethra No masses tenderness or scarring.  Meatus Normal size without lesions or prolapse.  Cervix: See below.  Anus: Normal exam.  No lesions.  Perineum: Normal exam.  No lesions.        Bimanual   Uterus: Normal size.  Non-tender.  Mobile.  AV.  Adnexae: No masses.  Non-tender to palpation.  Cul-de-sac: Negative for abnormality.   PROCEDURE: 1.  Urine Pregnancy Test:  not done 2.  Colposcopy performed with 4% acetic acid after verbal consent obtained                                         -Aceto-white Lesions Location(s): 6, 12, and 2 o'clock.              -Biopsy performed at 6, 12, and 2 o'clock               -ECC indicated and performed: Yes.       -Biopsy sites made hemostatic with pressure, AgNO3, and/or Monsel's solution   -Satisfactory colposcopy: No.    -Evidence of Invasive cervical CA :  NO  ASSESSMENT:  Alexandria [name] is a 32 y.o. [phone] here for  1. LGSIL on Pap smear of cervix    PLAN:  I discussed the grading system of pap smears and HPV high risk viral types.  We will discuss and base management after colpo results return.     Prentice Docker, MD  [facility] 09/16/2017  4:22 PM   ADDENDUM: All pathology results normal. Recommendation per ASCCP is to follow up in one year for a repeat pap smear with HPV obligate. She may accomplish this at her PCP or I am happy to perform the pap smear and follow up.   CC: Arnetha Courser, MD [phone]  [place], [place] [address]

## 2017-09-18 LAB — PATHOLOGY

## 2017-09-24 ENCOUNTER — Telehealth: Payer: Self-pay | Admitting: Obstetrics and Gynecology

## 2017-09-24 NOTE — Telephone Encounter (Signed)
Discussed normal colposcopy results. Per ASCCP recommendations, follow up pap smear with HPV in one year. She voiced understanding and agreement.

## 2017-09-29 ENCOUNTER — Ambulatory Visit: Payer: Medicaid Other | Admitting: Gastroenterology

## 2017-09-29 ENCOUNTER — Encounter: Payer: Self-pay | Admitting: Gastroenterology

## 2017-09-30 DIAGNOSIS — E042 Nontoxic multinodular goiter: Secondary | ICD-10-CM | POA: Diagnosis not present

## 2017-10-01 ENCOUNTER — Encounter: Payer: Self-pay | Admitting: Gastroenterology

## 2017-10-01 ENCOUNTER — Ambulatory Visit: Payer: Medicaid Other | Admitting: Gastroenterology

## 2017-11-06 ENCOUNTER — Ambulatory Visit: Payer: Medicaid Other | Admitting: Family Medicine

## 2017-11-17 ENCOUNTER — Ambulatory Visit: Payer: Medicaid Other | Admitting: Family Medicine

## 2017-11-17 ENCOUNTER — Encounter: Payer: Self-pay | Admitting: Family Medicine

## 2017-11-17 DIAGNOSIS — E669 Obesity, unspecified: Secondary | ICD-10-CM | POA: Diagnosis not present

## 2017-11-17 DIAGNOSIS — F339 Major depressive disorder, recurrent, unspecified: Secondary | ICD-10-CM

## 2017-11-17 MED ORDER — BUSPIRONE HCL 15 MG PO TABS: 15.0000 mg | ORAL_TABLET | Freq: Two times a day (BID) | ORAL | 11 refills | Status: DC

## 2017-11-17 NOTE — Patient Instructions (Signed)
Keep up the good work!  Check out the information at familydoctor.org entitled "Nutrition for Weight Loss: What You Need to Know about Fad Diets" Try to lose between 1-2 pounds per week by taking in fewer calories and burning off more calories You can succeed by limiting portions, limiting foods dense in calories and fat, becoming more active, and drinking 8 glasses of water a day (64 ounces) Don't skip meals, especially breakfast, as skipping meals may alter your metabolism Do not use over-the-counter weight loss pills or gimmicks that claim rapid weight loss A healthy BMI (or body mass index) is between 18.5 and 24.9 You can calculate your ideal BMI at the [employer] website ClubMonetize.fr  12 Ways to Curb Anxiety  ?Anxiety is normal human sensation. It is what helped our ancestors survive the pitfalls of the wilderness. Anxiety is defined as experiencing worry or nervousness about an imminent event or something with an uncertain outcome. It is a feeling experienced by most people at some point in their lives. Anxiety can be triggered by a very personal issue, such as the illness of a loved one, or an event of global proportions, such as a refugee crisis. Some of the symptoms of anxiety are:  Feeling restless.  Having a feeling of impending danger.  Increased heart rate.  Rapid breathing. Sweating.  Shaking.  Weakness or feeling tired.  Difficulty concentrating on anything except the current worry.  Insomnia.  Stomach or bowel problems. What can we do about anxiety we may be feeling? There are many techniques to help manage stress and relax. Here are 12 ways you can reduce your anxiety almost immediately: 1. Turn off the constant feed of information. Take a social media sabbatical. Studies have shown that social media directly contributes to social anxiety.  2. Monitor your television viewing habits. Are you watching shows that are also  contributing to your anxiety, such as 24-hour news stations? Try watching something else, or better yet, nothing at all. Instead, listen to music, read an inspirational book or practice a hobby. 3. Eat nutritious meals. Also, don't skip meals and keep healthful snacks on hand. Hunger and poor diet contributes to feeling anxious. 4. Sleep. Sleeping on a regular schedule for at least seven to eight hours a night will do wonders for your outlook when you are awake. 5. Exercise. Regular exercise will help rid your body of that anxious energy and help you get more restful sleep. 6. Try deep (diaphragmatic) breathing. Inhale slowly through your nose for five seconds and exhale through your mouth. 7. Practice acceptance and gratitude. When anxiety hits, accept that there are things out of your control that shouldn't be of immediate concern.  8. Seek out humor. When anxiety strikes, watch a funny video, read jokes or call a friend who makes you laugh. Laughter is healing for our bodies and releases endorphins that are calming. 9. Stay positive. Take the effort to replace negative thoughts with positive ones. Try to see a stressful situation in a positive light. Try to come up with solutions rather than dwelling on the problem. 10. Figure out what triggers your anxiety. Keep a journal and make note of anxious moments and the events surrounding them. This will help you identify triggers you can avoid or even eliminate. 11. Talk to someone. Let a trusted friend, family member or even trained professional know that you are feeling overwhelmed and anxious. Verbalize what you are feeling and why.  12. Volunteer. If your anxiety is triggered by  a crisis on a large scale, become an advocate and work to resolve the problem that is causing you unease. Anxiety is often unwelcome and can become overwhelming. If not kept in check, it can become a disorder that could require medical treatment. However, if you take the time to  care for yourself and avoid the triggers that make you anxious, you will be able to find moments of relaxation and clarity that make your life much more enjoyable.

## 2017-11-17 NOTE — Assessment & Plan Note (Signed)
Well-controlled; continue current medicine; refills offered

## 2017-11-17 NOTE — Assessment & Plan Note (Signed)
Stay hydrated with water; try to wear step counter; get to the park with the kids and stay active and play; avoid excessive heat; work on weight loss by limiting snacks; try shopping without children in tow

## 2017-11-17 NOTE — Progress Notes (Signed)
BP 110/70   Pulse 98   Temp 98.2 F (36.8 C) (Oral)   Resp 14   Ht 5\' 4"  (1.626 m)   Wt 229 lb 3.2 oz (104 kg)   LMP 11/04/2017   SpO2 96%   BMI 39.34 kg/m    Subjective:    Patient ID: Alexandria [name], female    DOB: Mar 04, 1986, 32 y.o.   MRN: [id number]  HPI: Alexandria [name] is a 32 y.o. female  Chief Complaint  Patient presents with  . Follow-up    HPI Patient is here for f/u BP is great; not depressed She is not taking the birth control right now; not hoping to get pregnant; would not be upset if she did get pregnant; no problems with the medicine at all She says that her kids are home from the summer; kids are 71 years of age and younger; sugary snacks in the house; she says that is why she can't lose weight; she eats 3 balanced meals a day and drinks water throughout the day She thinks she gets enough exercise; taking care of kids all day, walking back and forth cleaning up; no running or jogging; moving all day; she does not know how many steps she is getting; she has a step monitor and will start monitoring Dr. Evorn Gong at dermatologist office prescribes the glycopyrrolate, excessive sweating Her anxiety level has been pretty good later; when their dad is at work and she's there with all five kids alone, the kids will fight over the PlayStation, lots of stuff going on; gets a little anxious; feels good overall; I offered parenting classes or counseling; she says she is managing fine; does the best she can do; everybody is pretty good, everybody gets fed, doing well with potty training Not feeling depressed or down; 60 mg of fluoxetine daily; buspar is mostly twice a day every day; sometimes does not take the 2nd dose  Depression screen [facility] 2/9 11/17/2017 08/07/2017 10/15/2016 07/14/2016 05/26/2016  Decreased Interest 0 0 0 0 0  Down, Depressed, Hopeless 0 0 0 0 1  PHQ - 2 Score 0 0 0 0 1  Altered sleeping 0 2 - - -  Tired, decreased energy 0 1 - - -  Change in appetite 0  0 - - -  Feeling bad or failure about yourself  0 0 - - -  Trouble concentrating 0 1 - - -  Moving slowly or fidgety/restless 0 0 - - -  Suicidal thoughts 0 0 - - -  PHQ-9 Score 0 4 - - -  Difficult doing work/chores Not difficult at all Not difficult at all - - -    Relevant past medical, surgical, family and social history reviewed Past Medical History:  Diagnosis Date  . Anxiety   . CIN III (cervical intraepithelial neoplasia III)    2011 or 2012-LEEP  . Depression   . High risk HPV infection    followed by GYN  . Multinodular goiter    Past Surgical History:  Procedure Laterality Date  . 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 CERVICAL BIOPSY  W/ LOOP ELECTRODE EXCISION  2014   [facility] Dept  . COLPOSCOPY  06/15/2012   [facility], Dr Ardell Isaacs: CIN III   Family History  Problem Relation Age of Onset  . Insomnia Mother   . Colon cancer Mother 101       colon cancer  . Cervical cancer Maternal Aunt  Cervical Cancer  . Colon cancer Paternal Aunt        Colon cancer  . Asthma Brother   . Diabetes Maternal Grandmother   . Diabetes Maternal Grandfather    Social History   Tobacco Use  . Smoking status: Former Research scientist (life sciences)  . Smokeless tobacco: Never Used  Substance Use Topics  . Alcohol use: No    Alcohol/week: 0.0 oz  . Drug use: No    Interim medical history since last visit reviewed. Allergies and medications reviewed  Review of Systems Per HPI unless specifically indicated above     Objective:    BP 110/70   Pulse 98   Temp 98.2 F (36.8 C) (Oral)   Resp 14   Ht 5\' 4"  (1.626 m)   Wt 229 lb 3.2 oz (104 kg)   LMP 11/04/2017   SpO2 96%   BMI 39.34 kg/m   Wt Readings from Last 3 Encounters:  11/17/17 229 lb 3.2 oz (104 kg)  09/16/17 215 lb (97.5 kg)  08/19/17 210 lb (95.3 kg)    Physical Exam  Constitutional: She appears well-developed and well-nourished.  HENT:  Mouth/Throat: Mucous membranes are normal.    Eyes: EOM are normal. No scleral icterus.  Cardiovascular: Normal rate and regular rhythm.  Pulmonary/Chest: Effort normal and breath sounds normal.  Psychiatric: She has a normal mood and affect. Her behavior is normal.      Assessment & Plan:   Problem List Items Addressed This Visit      Other   Obesity (BMI 35.0-39.9 without comorbidity)    Stay hydrated with water; try to wear step counter; get to the park with the kids and stay active and play; avoid excessive heat; work on weight loss by limiting snacks; try shopping without children in tow      Depression, recurrent ([place])    Well-controlled; continue current medicine; refills offered      Relevant Medications   busPIRone (BUSPAR) 15 MG tablet       Follow up plan: Return in about 6 months (around 05/20/2018) for follow-up visit with Dr. Sanda Klein.  An after-visit summary was printed and given to the patient at [facility].  Please see the patient instructions which may contain other information and recommendations beyond what is mentioned above in the assessment and plan.  Meds ordered this encounter  Medications  . busPIRone (BUSPAR) 15 MG tablet    Sig: Take 1 tablet (15 mg total) by mouth 2 (two) times daily.    Dispense:  60 tablet    Refill:  11    No orders of the defined types were placed in this encounter.

## 2017-12-30 ENCOUNTER — Ambulatory Visit: Payer: Medicaid Other | Admitting: Obstetrics and Gynecology

## 2018-01-08 ENCOUNTER — Ambulatory Visit (INDEPENDENT_AMBULATORY_CARE_PROVIDER_SITE_OTHER): Payer: Medicaid Other | Admitting: Obstetrics and Gynecology

## 2018-01-08 ENCOUNTER — Encounter: Payer: Self-pay | Admitting: Obstetrics and Gynecology

## 2018-01-08 VITALS — BP 120/72 | HR 102 | Ht 64.0 in | Wt 230.0 lb

## 2018-01-08 DIAGNOSIS — N914 Secondary oligomenorrhea: Secondary | ICD-10-CM

## 2018-01-08 DIAGNOSIS — Z3202 Encounter for pregnancy test, result negative: Secondary | ICD-10-CM | POA: Diagnosis not present

## 2018-01-08 DIAGNOSIS — Z30011 Encounter for initial prescription of contraceptive pills: Secondary | ICD-10-CM

## 2018-01-08 DIAGNOSIS — N912 Amenorrhea, unspecified: Secondary | ICD-10-CM

## 2018-01-08 DIAGNOSIS — N915 Oligomenorrhea, unspecified: Secondary | ICD-10-CM | POA: Insufficient documentation

## 2018-01-08 LAB — POCT URINE PREGNANCY: PREG TEST UR: NEGATIVE

## 2018-01-08 MED ORDER — NORGESTIMATE-ETH ESTRADIOL 0.25-35 MG-MCG PO TABS
1.0000 | ORAL_TABLET | Freq: Every day | ORAL | 3 refills | Status: DC
Start: 2018-01-08 — End: 2018-05-20

## 2018-01-08 NOTE — Progress Notes (Signed)
Obstetrics & Gynecology Office Visit   Chief Complaint  Patient presents with  . Amenorrhea    negative home tests    History of Present Illness: 32 y.o. [id number] female who presents for amenorrhea.  Her last menstrual period was 11/06/17.  Up until that time she had a regular, monthly menses that came about every 30 days, lasting for about 4-5 days.  She has never had absence of menses before.  She has an eight-month old whom she never breast fed. However, she has a three year-old who occasionally breast feeds.  She has been on no contraception since the birth of her 65 month old.  The only medication that is different for her is glycopyrrolate for excessive sweating. She started taking this about 1 month ago.  She denies headaches and changes in her vision.  She is still lactating.  She denies skin and hair changes. She states that she is gaining weight.  She has gained 20 pounds in 5 months.  She attributes a very busy lifestyle to her weight gain.  She denies any new stress in life.   Past Medical History:  Diagnosis Date  . Anxiety   . CIN III (cervical intraepithelial neoplasia III)    2011 or 2012-LEEP  . Depression   . High risk HPV infection    followed by GYN  . Multinodular goiter    Past Surgical History:  Procedure Laterality Date  . 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 CERVICAL BIOPSY  W/ LOOP ELECTRODE EXCISION  2014   [facility] Dept  . COLPOSCOPY  06/15/2012   [facility], Dr Ardell Isaacs: CIN III   Gynecologic History: No LMP recorded.  Obstetric History: [id number]  Family History  Problem Relation Age of Onset  . Insomnia Mother   . Colon cancer Mother 47       colon cancer  . Cervical cancer Maternal Aunt        Cervical Cancer  . Colon cancer Paternal Aunt        Colon cancer  . Asthma Brother   . Diabetes Maternal Grandmother   . Diabetes Maternal Grandfather     Social History   Socioeconomic History  . Marital  status: Divorced    Spouse name: Freida Busman  . Number of children: 5  . Years of education: Not on file  . Highest education level: Associate degree: occupational, Hotel manager, or vocational program  Occupational History  . Occupation: Delivery driver  Social Needs  . Financial resource strain: Not very hard  . Food insecurity:    Worry: Never true    Inability: Never true  . Transportation needs:    Medical: No    Non-medical: No  Tobacco Use  . Smoking status: Former Research scientist (life sciences)  . Smokeless tobacco: Never Used  Substance and Sexual Activity  . Alcohol use: No    Alcohol/week: 0.0 standard drinks  . Drug use: No  . Sexual activity: Yes    Partners: Male    Birth control/protection: None  Lifestyle  . Physical activity:    Days per week: 4 days    Minutes per session: 40 min  . Stress: Rather much  Relationships  . Social connections:    Talks on phone: More than three times a week    Gets together: Once a week    Attends religious service: 1 to 4 times per year    Active member of club or organization: No    Attends  meetings of clubs or organizations: Never    Relationship status: Divorced  . Intimate partner violence:    Fear of current or ex partner: No    Emotionally abused: No    Physically abused: No    Forced sexual activity: No  Other Topics Concern  . Not on file  Social History Narrative  . Not on file   Allergies: No Known Allergies  Prior to Admission medications   Medication Sig Start Date End Date Taking? Authorizing Provider  busPIRone (BUSPAR) 15 MG tablet Take 1 tablet (15 mg total) by mouth 2 (two) times daily. 11/17/17   Arnetha Courser, MD  ferrous sulfate 325 (65 FE) MG tablet Take 325 mg by mouth daily with breakfast.    [provider]  FLUoxetine (PROZAC) 20 MG capsule Take 3 capsules (60 mg total) by mouth daily. (for mood; stop escitalopram) 08/07/17   Lada, Satira Anis, MD  glycopyrrolate (ROBINUL) 1 MG tablet Take 2 mg by mouth daily.  10/25/17   [provider]   Multivit-Min-Fe-FA (VITAMINS PO) Take by mouth.    [provider]    Review of Systems  Constitutional: Negative.   HENT: Negative.   Eyes: Negative.   Respiratory: Negative.   Cardiovascular: Negative.   Gastrointestinal: Negative.   Genitourinary: Negative.   Musculoskeletal: Negative.   Skin: Negative.   Neurological: Negative.   Psychiatric/Behavioral: Negative.      Physical Exam BP 120/72 (BP Location: Left Arm, Patient Position: Sitting, Cuff Size: Normal)   Pulse (!) 102   Ht 5\' 4"  (1.626 m)   Wt 230 lb (104.3 kg)   Breastfeeding? No   BMI 39.48 kg/m  No LMP recorded. Physical Exam  Constitutional: She is oriented to person, place, and time. She appears well-developed and well-nourished. No distress.  HENT:  Head: Normocephalic and atraumatic.  Eyes: Conjunctivae are normal. No scleral icterus.  Neck: Normal range of motion. Neck supple. No thyromegaly present.  Cardiovascular: Normal rate and regular rhythm. Exam reveals no gallop and no friction rub.  No murmur heard. Pulmonary/Chest: Effort normal and breath sounds normal. No respiratory distress. She has no wheezes. She has no rales.  Abdominal: Soft. Bowel sounds are normal. She exhibits no distension and no mass. There is no tenderness. There is no rebound and no guarding.  Lymphadenopathy:    She has no cervical adenopathy.  Neurological: She is alert and oriented to person, place, and time. No cranial nerve deficit.  Skin: Skin is warm and dry. No erythema.  Psychiatric: She has a normal mood and affect. Her behavior is normal. Judgment normal.   Urine pregnancy test: negative  Assessment: 32 y.o. [id number] female here for  1. Secondary oligomenorrhea   2. Amenorrhea   3. Encounter for initial prescription of contraceptive pills      Plan: Problem List Items Addressed This Visit      Other   Oligomenorrhea - Primary   Relevant Medications    norgestimate-ethinyl estradiol (SPRINTEC 28) 0.25-35 MG-MCG tablet    Other Visit Diagnoses    Amenorrhea       Relevant Orders   POCT urine pregnancy (Completed)   Encounter for initial prescription of contraceptive pills       Relevant Medications   norgestimate-ethinyl estradiol ([place] 28) 0.25-35 MG-MCG tablet     This is a relatively new phenomenon for her. She has an unexplained new weight gain and a missed menses.  Will cycle her with Sprintec and this will  also provide her with contraception. Discussed that this could be stress-related. She will attempt weight loss and lifestyle modifications in the mean time.  Will perform a full workup if she continues to have issues.   20 minutes spent in face to face discussion with > 50% spent in counseling,management, and coordination of care of her secondary oligomenorrhea.  Prentice Docker, MD 01/10/2018 5:56 PM

## 2018-01-10 ENCOUNTER — Encounter: Payer: Self-pay | Admitting: Obstetrics and Gynecology

## 2018-04-25 ENCOUNTER — Other Ambulatory Visit: Payer: Self-pay | Admitting: Family Medicine

## 2018-05-20 ENCOUNTER — Encounter: Payer: Self-pay | Admitting: Family Medicine

## 2018-05-20 ENCOUNTER — Ambulatory Visit: Payer: Medicaid Other | Admitting: Family Medicine

## 2018-05-20 DIAGNOSIS — E041 Nontoxic single thyroid nodule: Secondary | ICD-10-CM

## 2018-05-20 DIAGNOSIS — D069 Carcinoma in situ of cervix, unspecified: Secondary | ICD-10-CM

## 2018-05-20 DIAGNOSIS — F339 Major depressive disorder, recurrent, unspecified: Secondary | ICD-10-CM | POA: Diagnosis not present

## 2018-05-20 DIAGNOSIS — F419 Anxiety disorder, unspecified: Secondary | ICD-10-CM

## 2018-05-20 DIAGNOSIS — E042 Nontoxic multinodular goiter: Secondary | ICD-10-CM

## 2018-05-20 DIAGNOSIS — E669 Obesity, unspecified: Secondary | ICD-10-CM | POA: Diagnosis not present

## 2018-05-20 MED ORDER — FLUOXETINE HCL 20 MG PO CAPS: 60.0000 mg | ORAL_CAPSULE | Freq: Every day | ORAL | 6 refills | Status: DC

## 2018-05-20 NOTE — Assessment & Plan Note (Signed)
Managed by endocrinolgoist

## 2018-05-20 NOTE — Assessment & Plan Note (Signed)
Talked with patient about hurdles to reaching ideal weight; trouble shooting to get more active; staying hydrated; try to lose 0.5 to 1 pound a week over the coming year

## 2018-05-20 NOTE — Assessment & Plan Note (Signed)
Continue medicine 

## 2018-05-20 NOTE — Assessment & Plan Note (Signed)
Managed by endocrinologist

## 2018-05-20 NOTE — Assessment & Plan Note (Signed)
Continue current medicine ?

## 2018-05-20 NOTE — Progress Notes (Signed)
BP 118/68   Pulse 99   Temp 97.9 F (36.6 C)   Ht 5\' 4"  (1.626 m)   Wt 228 lb 1.6 oz (103.5 kg)   LMP 04/19/2018 (Exact Date)   SpO2 95%   BMI 39.15 kg/m    Subjective:    Patient ID: Alexandria [name], female    DOB: Jun 27, 1985, 33 y.o.   MRN: [id number]  HPI: Alexandria [name] is a 33 y.o. female  Chief Complaint  Patient presents with  . Follow-up    HPI Here for follow-up; no medical excitement since last visit meds reviewed; no longer taking OCPs; prescribed by GYN; no reactions, just quit taking  She has goiter and a thyroid nodule; she went to a specialist; they did an ultrasound she says; everything was still about the same, compared to prior US; she goes back in the summer; [facility]; weight is about the same  Obesity; trying to eat better; she is moving around constantly, cooking 3x a day and cleaning for 5 kids; doing yoga; nothing formalized, but active around the house; drinking enough water she thinks Down 2 pounds since September; ideal weight would be 170 pounds; she has always been a little heavier, never skinny her entire life; she thinks that is a healthy weight, happy at that weight; hurdles to that goal asked: her diet, sodas, really enjoys [employer] and [employer]; tries to limit to two drinks a day, versus 8 cans of soda a day; busy with the kids, keeping her from exercising  Depression; doing well; wants to stay on medicine; has tried to wean off in the past and it did go well; she wishes to continue; helps anxiety as well; depression has been recurrent, started at age 70 years of age; major; runs in the family; using the buspar every day, helping more when being inside the house 24/7 with the cold weather  Depression screen [facility] 2/9 05/20/2018 11/17/2017 08/07/2017 10/15/2016 07/14/2016  Decreased Interest 0 0 0 0 0  Down, Depressed, Hopeless 0 0 0 0 0  PHQ - 2 Score 0 0 0 0 0  Altered sleeping 0 0 2 - -  Tired, decreased energy 0 0 1 - -  Change in  appetite 0 0 0 - -  Feeling bad or failure about yourself  0 0 0 - -  Trouble concentrating 0 0 1 - -  Moving slowly or fidgety/restless 0 0 0 - -  Suicidal thoughts 0 0 0 - -  PHQ-9 Score 0 0 4 - -  Difficult doing work/chores Not difficult at all Not difficult at all Not difficult at all - -   Fall Risk  05/20/2018 11/17/2017 08/07/2017 10/15/2016 07/14/2016  Falls in the past year? 0 No No Yes Yes  Number falls in past yr: - - - 1 1  Injury with Fall? - - - No No    Relevant past medical, surgical, family and social history reviewed Past Medical History:  Diagnosis Date  . Anxiety   . CIN III (cervical intraepithelial neoplasia III)    2011 or 2012-LEEP  . Depression   . High risk HPV infection    followed by GYN  . Multinodular goiter    Past Surgical History:  Procedure Laterality Date  . 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 CERVICAL BIOPSY  W/ LOOP ELECTRODE EXCISION  2014   [facility] Dept  . COLPOSCOPY  06/15/2012   [facility], Dr  Mizinger: CIN III   Family History  Problem Relation Age of Onset  . Insomnia Mother   . Colon cancer Mother 30       colon cancer  . Cervical cancer Maternal Aunt        Cervical Cancer  . Colon cancer Paternal Aunt        Colon cancer  . Asthma Brother   . Diabetes Maternal Grandmother   . Diabetes Maternal Grandfather    Social History   Tobacco Use  . Smoking status: Former Research scientist (life sciences)  . Smokeless tobacco: Never Used  Substance Use Topics  . Alcohol use: No    Alcohol/week: 0.0 standard drinks  . Drug use: No     Office Visit from 05/20/2018 in [facility]  AUDIT-C Score  0      Interim medical history since last visit reviewed. Allergies and medications reviewed  Review of Systems Per HPI unless specifically indicated above     Objective:    BP 118/68   Pulse 99   Temp 97.9 F (36.6 C)   Ht 5\' 4"  (1.626 m)   Wt 228 lb 1.6 oz (103.5 kg)   LMP 04/19/2018  (Exact Date)   SpO2 95%   BMI 39.15 kg/m   Wt Readings from Last 3 Encounters:  05/20/18 228 lb 1.6 oz (103.5 kg)  01/08/18 230 lb (104.3 kg)  11/17/17 229 lb 3.2 oz (104 kg)    Physical Exam Constitutional:      Appearance: She is well-developed. She is obese.  Eyes:     General: No scleral icterus. Neck:     Thyroid: No thyromegaly or thyroid tenderness.     Comments: Suggestion of palpable enlargement inferior LEFT side more than right Cardiovascular:     Rate and Rhythm: Normal rate and regular rhythm.  Pulmonary:     Effort: Pulmonary effort is normal.     Breath sounds: Normal breath sounds.  Neurological:     Mental Status: She is alert.  Psychiatric:        Mood and Affect: Mood is not anxious or depressed.        Behavior: Behavior normal.     Results for orders placed or performed in visit on 01/08/18  POCT urine pregnancy  Result Value Ref Range   Preg Test, Ur Negative Negative      Assessment & Plan:   Problem List Items Addressed This Visit      Endocrine   Thyroid nodule    Managed by endocrinologist      Goiter, nontoxic, multinodular    Managed by endocrinolgoist        Genitourinary   CIN III (cervical intraepithelial neoplasia grade III) with severe dysplasia    Hx of abnormal pap smears; seeing GYN        Other   Obesity (BMI 35.0-39.9 without comorbidity)    Talked with patient about hurdles to reaching ideal weight; trouble shooting to get more active; staying hydrated; try to lose 0.5 to 1 pound a week over the coming year      Depression, recurrent ([place])    Continue current medicine      Relevant Medications   FLUoxetine (PROZAC) 20 MG capsule   Anxiety    Continue medicine      Relevant Medications   FLUoxetine (PROZAC) 20 MG capsule       Follow up plan: Return in about 6 months (around 11/18/2018) for follow-up visit with Dr. Sanda Klein.  An after-visit summary was printed and given to the patient at [facility].  Please  see the patient instructions which may contain other information and recommendations beyond what is mentioned above in the assessment and plan.  Meds ordered this encounter  Medications  . FLUoxetine (PROZAC) 20 MG capsule    Sig: Take 3 capsules (60 mg total) by mouth daily. (for mood; stop escitalopram)    Dispense:  90 capsule    Refill:  6    No orders of the defined types were placed in this encounter.

## 2018-05-20 NOTE — Patient Instructions (Signed)
Obesity, Adult Obesity is the condition of having too much total body fat. Being overweight or obese means that your weight is greater than what is considered healthy for your body size. Obesity is determined by a measurement called BMI. BMI is an estimate of body fat and is calculated from height and weight. For adults, a BMI of 30 or higher is considered obese. Obesity can eventually lead to other health concerns and major illnesses, including:  Stroke.  Coronary artery disease (CAD).  Type 2 diabetes.  Some types of cancer, including cancers of the colon, breast, uterus, and gallbladder.  Osteoarthritis.  High blood pressure (hypertension).  High cholesterol.  Sleep apnea.  Gallbladder stones.  Infertility problems. What are the causes? The main cause of obesity is taking in (consuming) more calories than your body uses for energy. Other factors that contribute to this condition may include:  Being born with genes that make you more likely to become obese.  Having a medical condition that causes obesity. These conditions include: ? Hypothyroidism. ? Polycystic ovarian syndrome (PCOS). ? Binge-eating disorder. ? Cushing syndrome.  Taking certain medicines, such as steroids, antidepressants, and seizure medicines.  Not being physically active (sedentary lifestyle).  Living where there are limited places to exercise safely or buy healthy foods.  Not getting enough sleep. What increases the risk? The following factors may increase your risk of this condition:  Having a family history of obesity.  Being a woman of African-American descent.  Being a man of Hispanic descent. What are the signs or symptoms? Having excessive body fat is the main symptom of this condition. How is this diagnosed? This condition may be diagnosed based on:  Your symptoms.  Your medical history.  A physical exam. Your health care provider may measure: ? Your BMI. If you are an adult  with a BMI between 25 and less than 30, you are considered overweight. If you are an adult with a BMI of 30 or higher, you are considered obese. ? The distances around your hips and your waist (circumferences). These may be compared to each other to help diagnose your condition. ? Your skinfold thickness. Your health care provider may gently pinch a fold of your skin and measure it. How is this treated? Treatment for this condition often includes changing your lifestyle. Treatment may include some or all of the following:  Dietary changes. Work with your health care provider and a dietitian to set a weight-loss goal that is healthy and reasonable for you. Dietary changes may include eating: ? Smaller portions. A portion size is the amount of a particular food that is healthy for you to eat at one time. This varies from person to person. ? Low-calorie or low-fat options. ? More whole grains, fruits, and vegetables.  Regular physical activity. This may include aerobic activity (cardio) and strength training.  Medicine to help you lose weight. Your health care provider may prescribe medicine if you are unable to lose 1 pound a week after 6 weeks of eating more healthily and doing more physical activity.  Surgery. Surgical options may include gastric banding and gastric bypass. Surgery may be done if: ? Other treatments have not helped to improve your condition. ? You have a BMI of 40 or higher. ? You have life-threatening health problems related to obesity. Follow these instructions at home:  Eating and drinking   Follow recommendations from your health care provider about what you eat and drink. Your health care provider may advise you  to: ? Limit fast foods, sweets, and processed snack foods. ? Choose low-fat options, such as low-fat milk instead of whole milk. ? Eat 5 or more servings of fruits or vegetables every day. ? Eat at home more often. This gives you more control over what you  eat. ? Choose healthy foods when you eat out. ? Learn what a healthy portion size is. ? Keep low-fat snacks on hand. ? Avoid sugary drinks, such as soda, fruit juice, iced tea sweetened with sugar, and flavored milk. ? Eat a healthy breakfast.  Drink enough water to keep your urine clear or pale yellow.  Do not go without eating for long periods of time (do not fast) or follow a fad diet. Fasting and fad diets can be unhealthy and even dangerous. Physical Activity  Exercise regularly, as told by your health care provider. Ask your health care provider what types of exercise are safe for you and how often you should exercise.  Warm up and stretch before being active.  Cool down and stretch after being active.  Rest between periods of activity. Lifestyle  Limit the time that you spend in front of your TV, computer, or video game system.  Find ways to reward yourself that do not involve food.  Limit alcohol intake to no more than 1 drink a day for nonpregnant women and 2 drinks a day for men. One drink equals 12 oz of beer, 5 oz of wine, or 1 oz of hard liquor. General instructions  Keep a weight loss journal to keep track of the food you eat and how much you exercise you get.  Take over-the-counter and prescription medicines only as told by your health care provider.  Take vitamins and supplements only as told by your health care provider.  Consider joining a support group. Your health care provider may be able to recommend a support group.  Keep all follow-up visits as told by your health care provider. This is important. Contact a health care provider if:  You are unable to meet your weight loss goal after 6 weeks of dietary and lifestyle changes. This information is not intended to replace advice given to you by your health care provider. Make sure you discuss any questions you have with your health care provider. Document Released: 05/22/2004 Document Revised: 09/17/2015  Document Reviewed: 01/31/2015 Elsevier Interactive Patient Education  2019 Elsevier Inc.  Preventing Unhealthy [employer], Adult Staying at a healthy weight is important to your overall health. When fat builds up in your body, you may become overweight or obese. Being overweight or obese increases your risk of developing certain health problems, such as heart disease, diabetes, sleeping problems, joint problems, and some types of cancer. Unhealthy weight gain is often the result of making unhealthy food choices or not getting enough exercise. You can make changes to your lifestyle to prevent obesity and stay as healthy as possible. What nutrition changes can be made?   Eat only as much as your body needs. To do this: ? Pay attention to signs that you are hungry or full. Stop eating as soon as you feel full. ? If you feel hungry, try drinking water first before eating. Drink enough water so your urine is clear or pale yellow. ? Eat smaller portions. Pay attention to portion sizes when eating out. ? Look at serving sizes on food labels. Most foods contain more than one serving per container. ? Eat the recommended number of calories for your gender and activity  level. For most active people, a daily total of 2,000 calories is appropriate. If you are trying to lose weight or are not very active, you may need to eat fewer calories. Talk with your health care provider or a diet and nutrition specialist (dietitian) about how many calories you need each day.  Choose healthy foods, such as: ? Fruits and vegetables. At each meal, try to fill at least half of your plate with fruits and vegetables. ? Whole grains, such as whole-wheat bread, brown rice, and quinoa. ? Lean meats, such as chicken or fish. ? Other healthy proteins, such as beans, eggs, or tofu. ? Healthy fats, such as nuts, seeds, fatty fish, and olive oil. ? Low-fat or fat-free dairy products.  Check food labels, and avoid food and drinks  that: ? Are high in calories. ? Have added sugar. ? Are high in sodium. ? Have saturated fats or trans fats.  Cook foods in healthier ways, such as by baking, broiling, or grilling.  Make a meal plan for the week, and shop with a grocery list to help you stay on track with your purchases. Try to avoid going to the grocery store when you are hungry.  When grocery shopping, try to shop around the outside of the store first, where the fresh foods are. Doing this helps you to avoid prepackaged foods, which can be high in sugar, salt (sodium), and fat. What lifestyle changes can be made?   Exercise for 30 or more minutes on 5 or more days each week. Exercising may include brisk walking, yard work, biking, running, swimming, and team sports like basketball and soccer. Ask your health care provider which exercises are safe for you.  Do muscle-strengthening activities, such as lifting weights or using resistance bands, on 2 or more days a week.  Do not use any products that contain nicotine or tobacco, such as cigarettes and e-cigarettes. If you need help quitting, ask your health care provider.  Limit alcohol intake to no more than 1 drink a day for nonpregnant women and 2 drinks a day for men. One drink equals 12 oz of beer, 5 oz of wine, or 1 oz of hard liquor.  Try to get 7-9 hours of sleep each night. What other changes can be made?  Keep a food and activity journal to keep track of: ? What you ate and how many calories you had. Remember to count the calories in sauces, dressings, and side dishes. ? Whether you were active, and what exercises you did. ? Your calorie, weight, and activity goals.  Check your weight regularly. Track any changes. If you notice you have gained weight, make changes to your diet or activity routine.  Avoid taking weight-loss medicines or supplements. Talk to your health care provider before starting any new medicine or supplement.  Talk to your health care  provider before trying any new diet or exercise plan. Why are these changes important? Eating healthy, staying active, and having healthy habits can help you to prevent obesity. Those changes also:  Help you manage stress and emotions.  Help you connect with friends and family.  Improve your self-esteem.  Improve your sleep.  Prevent long-term health problems. What can happen if changes are not made? Being obese or overweight can cause you to develop joint or bone problems, which can make it hard for you to stay active or do activities you enjoy. Being obese or overweight also puts stress on your heart and lungs and can  lead to health problems like diabetes, heart disease, and some cancers. Where to find more information Talk with your health care provider or a dietitian about healthy eating and healthy lifestyle choices. You may also find information from:  U.S. Department of Agriculture, MyPlate: FormerBoss.no  American Heart Association: www.heart.org  Centers for Disease Control and Prevention: http://www.wolf.info/ Summary  Staying at a healthy weight is important to your overall health. It helps you to prevent certain diseases and health problems, such as heart disease, diabetes, joint problems, sleep disorders, and some types of cancer.  Being obese or overweight can cause you to develop joint or bone problems, which can make it hard for you to stay active or do activities you enjoy.  You can prevent unhealthy weight gain by eating a healthy diet, exercising regularly, not smoking, limiting alcohol, and getting enough sleep.  Talk with your health care provider or a dietitian for guidance about healthy eating and healthy lifestyle choices. This information is not intended to replace advice given to you by your health care provider. Make sure you discuss any questions you have with your health care provider. Document Released: 04/15/2016 Document Revised: 01/23/2017 Document  Reviewed: 05/21/2016 Elsevier Interactive Patient Education  2019 [employer].

## 2018-05-20 NOTE — Assessment & Plan Note (Signed)
Hx of abnormal pap smears; seeing GYN

## 2018-07-29 ENCOUNTER — Telehealth: Payer: Self-pay | Admitting: Family Medicine

## 2018-07-29 DIAGNOSIS — E041 Nontoxic single thyroid nodule: Secondary | ICD-10-CM

## 2018-07-29 NOTE — Telephone Encounter (Signed)
-----   Message from Arnetha Courser, MD sent at 08/30/2017 10:57 AM EDT ----- Regarding: rescan thyroid Aug 27, 2018 rescan thyroid Aug 27, 2018

## 2018-07-29 NOTE — Assessment & Plan Note (Signed)
rescan thyroid may 2020

## 2018-07-29 NOTE — Telephone Encounter (Signed)
Left detailed voicemail

## 2018-07-29 NOTE — Telephone Encounter (Signed)
IMPRESSION: Interval enlargement of the two right thyroid nodules. Nodule #1 in the inferior right thyroid lobe has been previously biopsied and consistent with a Hurtle cell lesion, Bethesda class 3.  Nodule #2 in the superior right thyroid lobe is poorly defined and difficult to characterize the composition. This nodule meets criteria for 1 year follow-up.  The above is in keeping with the ACR TI-RADS recommendations - J Am Coll Radiol 2017;14:587-595.   Electronically Signed   By: Markus Daft M.D.   On: 08/25/2017 17:46  ---------------------------------------------------------  Let patient know that it will be time to rescan her thyroid in early May I'm ordering that and will ask her to work with scheduling to get that done in May Thank you

## 2018-08-09 ENCOUNTER — Encounter: Payer: Medicaid Other | Admitting: Family Medicine

## 2018-08-24 ENCOUNTER — Ambulatory Visit: Payer: Medicaid Other

## 2018-09-02 ENCOUNTER — Ambulatory Visit: Payer: Medicaid Other

## 2018-09-22 DIAGNOSIS — E042 Nontoxic multinodular goiter: Secondary | ICD-10-CM | POA: Diagnosis not present

## 2018-09-27 ENCOUNTER — Telehealth: Payer: Self-pay

## 2018-09-27 NOTE — Telephone Encounter (Signed)
Copied from [place] [id number]. Topic: General - Other >> Sep 27, 2018  3:23 PM Celene Kras A wrote: Reason for CRM: Pt called requesting a referral to a dermatologist. Please advise.

## 2018-09-27 NOTE — Telephone Encounter (Signed)
Can do virtual video visit or in-person.

## 2018-09-28 NOTE — Telephone Encounter (Signed)
Lvm to sch appt for referral to dermatology/ chang up coming appt with Lada also

## 2018-10-04 ENCOUNTER — Encounter: Payer: Self-pay | Admitting: Nurse Practitioner

## 2018-10-04 ENCOUNTER — Ambulatory Visit (INDEPENDENT_AMBULATORY_CARE_PROVIDER_SITE_OTHER): Payer: Medicaid Other | Admitting: Nurse Practitioner

## 2018-10-04 DIAGNOSIS — F419 Anxiety disorder, unspecified: Secondary | ICD-10-CM | POA: Diagnosis not present

## 2018-10-04 DIAGNOSIS — E042 Nontoxic multinodular goiter: Secondary | ICD-10-CM | POA: Diagnosis not present

## 2018-10-04 DIAGNOSIS — R61 Generalized hyperhidrosis: Secondary | ICD-10-CM | POA: Diagnosis not present

## 2018-10-04 DIAGNOSIS — F339 Major depressive disorder, recurrent, unspecified: Secondary | ICD-10-CM

## 2018-10-04 MED ORDER — BUSPIRONE HCL 15 MG PO TABS: 15.0000 mg | ORAL_TABLET | Freq: Three times a day (TID) | ORAL | 3 refills | Status: AC

## 2018-10-04 NOTE — Progress Notes (Signed)
Virtual Visit via Video Note  I connected with Alexandria [name] on 10/04/18 at  9:40 AM EDT by a video enabled telemedicine application and verified that I am speaking with the correct person using two identifiers.   Staff discussed the limitations of evaluation and management by telemedicine and the availability of in person appointments. The patient expressed understanding and agreed to proceed.  Patient location: home  My location: work office Other people present:  none HPI  Patient sees dermatologist for excessive sweating and spitz nevus robinul has provided a lot of relief taking it BID.  Paitent denies any concerning lesions, dry skin, rashes.  Patient is taking prozac 60mg  daily has been taking this for a year or 2 and it has been working well for her mood.   Patient is taking buspar 15mg  BID with some relief but not as much as xanax did in the past. Helps with overall feelings of anxiety but feels like it wears off. Patient denies panic attacks  Patient is being evaluated by Dr. Honor Junes- endocrinology for multinodular goiter, states she has a follow-up appointment coming up within the next month.   PHQ2/9: Depression screen [facility] 2/9 10/04/2018 05/20/2018 11/17/2017 08/07/2017 10/15/2016  Decreased Interest 0 0 0 0 0  Down, Depressed, Hopeless 0 0 0 0 0  PHQ - 2 Score 0 0 0 0 0  Altered sleeping 0 0 0 2 -  Tired, decreased energy 0 0 0 1 -  Change in appetite 0 0 0 0 -  Feeling bad or failure about yourself  0 0 0 0 -  Trouble concentrating 0 0 0 1 -  Moving slowly or fidgety/restless 0 0 0 0 -  Suicidal thoughts 0 0 0 0 -  PHQ-9 Score 0 0 0 4 -  Difficult doing work/chores Not difficult at all Not difficult at all Not difficult at all Not difficult at all -     PHQ reviewed. Negative  Patient Active Problem List   Diagnosis Date Noted  . Oligomenorrhea 01/08/2018  . Thyroid nodule 08/07/2017  . Postpartum care following vaginal delivery 04/21/2017  . Obesity (BMI  35.0-39.9 without comorbidity) 07/14/2016  . Depression, recurrent ([place]) 07/11/2016  . Neoplasm of uncertain behavior of skin 05/26/2016  . Verruca vulgaris 10/18/2015  . 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 Anxiety 10/06/2012  . CIN III (cervical intraepithelial neoplasia grade III) with severe dysplasia 08/06/2012    Past Medical History:  Diagnosis Date  . Anxiety   . CIN III (cervical intraepithelial neoplasia III)    2011 or 2012-LEEP  . Depression   . High risk HPV infection    followed by GYN  . Multinodular goiter     Past Surgical History:  Procedure Laterality Date  . 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 CERVICAL BIOPSY  W/ LOOP ELECTRODE EXCISION  2014   [facility]  . COLPOSCOPY  06/15/2012   [facility], Dr Ardell Isaacs: CIN III    Social History   Tobacco Use  . Smoking status: Former Research scientist (life sciences)  . Smokeless tobacco: Never Used  Substance Use Topics  . Alcohol use: No    Alcohol/week: 0.0 standard drinks     Current Outpatient Medications:  .  busPIRone (BUSPAR) 15 MG tablet, Take 1 tablet (15 mg total) by mouth 2 (two) times daily., Disp: 60 tablet, Rfl: 11 .  ferrous sulfate 325 (65 FE) MG tablet, Take 325 mg by mouth daily  with breakfast., Disp: , Rfl:  .  FLUoxetine (PROZAC) 20 MG capsule, Take 3 capsules (60 mg total) by mouth daily. (for mood; stop escitalopram), Disp: 90 capsule, Rfl: 6 .  Multiple Vitamins-Calcium (ONE-A-DAY WOMENS PO), Take 1 tablet by mouth., Disp: , Rfl:  .  Prenatal Multivit-Min-Fe-FA (PRENATAL VITAMINS PO), Take by mouth., Disp: , Rfl:  .  glycopyrrolate (ROBINUL) 1 MG tablet, Take 2 mg by mouth daily., Disp: , Rfl: 2  No Known Allergies  ROS   No other specific complaints in a complete review of systems (except as listed in HPI above).  Objective  There were no vitals filed for this visit.   There is no height or weight on file to calculate  BMI.  Nursing Note and Vital Signs reviewed.  Physical Exam   Constitutional: Patient appears well-developed and well-nourished. No distress.  HENT: Head: Normocephalic and atraumatic. Pulmonary/Chest: Effort normal  Musculoskeletal: Normal range of motion,  Neurological: alert and oriented, speech normal.  Skin: No rash noted. No erythema.  Psychiatric: Patient has a normal mood and affect. behavior is normal. Judgment and thought content normal.    Assessment & Plan  1. Hyperhidrosis - Ambulatory referral to Dermatology  2. Depression, recurrent ([place]) Stable, continue meds  3. Anxiety Increased dose, re-evlaluate in 2 months or sooner if needed - busPIRone (BUSPAR) 15 MG tablet; Take 1 tablet (15 mg total) by mouth 3 (three) times daily.  Dispense: 270 tablet; Refill: 3  4. Excessive sweating - Ambulatory referral to Dermatology  5. Goiter, nontoxic, multinodular Follow-up with endo  Follow Up Instructions:  Has physical in august   I discussed the assessment and treatment plan with the patient. The patient was provided an opportunity to ask questions and all were answered. The patient agreed with the plan and demonstrated an understanding of the instructions.   The patient was advised to call back or seek an in-person evaluation if the symptoms worsen or if the condition fails to improve as anticipated.  I provided 15 minutes of non-face-to-face time during this encounter.   Fredderick Severance, NP

## 2018-10-07 DIAGNOSIS — E042 Nontoxic multinodular goiter: Secondary | ICD-10-CM | POA: Diagnosis not present

## 2018-11-09 ENCOUNTER — Telehealth: Payer: Self-pay

## 2018-11-09 NOTE — Telephone Encounter (Signed)
Pt is not due for a pap until April 2022, last pap was April 2019. Called patient and left VM asking for clarification on the need for a pap.    Copied from [place] [id number]. Topic: General - Other >> Nov 08, 2018  2:20 PM Celene Kras A wrote: Reason for CRM: Pt called and is requesting to have an order put in for a papsmear. Please advise.

## 2018-11-10 DIAGNOSIS — E042 Nontoxic multinodular goiter: Secondary | ICD-10-CM | POA: Diagnosis not present

## 2018-11-11 DIAGNOSIS — E042 Nontoxic multinodular goiter: Secondary | ICD-10-CM | POA: Diagnosis not present

## 2018-11-17 ENCOUNTER — Ambulatory Visit: Payer: Medicaid Other | Admitting: Nurse Practitioner

## 2018-11-18 ENCOUNTER — Ambulatory Visit: Payer: Medicaid Other | Admitting: Family Medicine

## 2018-12-06 ENCOUNTER — Encounter: Payer: Medicaid Other | Admitting: Nurse Practitioner

## 2018-12-06 ENCOUNTER — Encounter: Payer: Medicaid Other | Admitting: Family Medicine

## 2018-12-22 ENCOUNTER — Encounter: Payer: Medicaid Other | Admitting: Nurse Practitioner

## 2019-01-03 ENCOUNTER — Other Ambulatory Visit: Payer: Self-pay | Admitting: Family Medicine

## 2019-01-15 IMAGING — US US THYROID
1 series · 13 of 25 positions shown · non-contrast
Comparison: 12/30/2013

CLINICAL DATA: Thyroid nodule. Right thyroid nodule biopsy on
01/12/2014. Biopsy was consistent with a Hurthle cell lesion.

EXAM:
THYROID ULTRASOUND
TECHNIQUE: Ultrasound examination of the thyroid gland and adjacent soft
tissues was performed.

[Series 1: us thyroid · 13 of 61 slices shown]
[im 1/61]
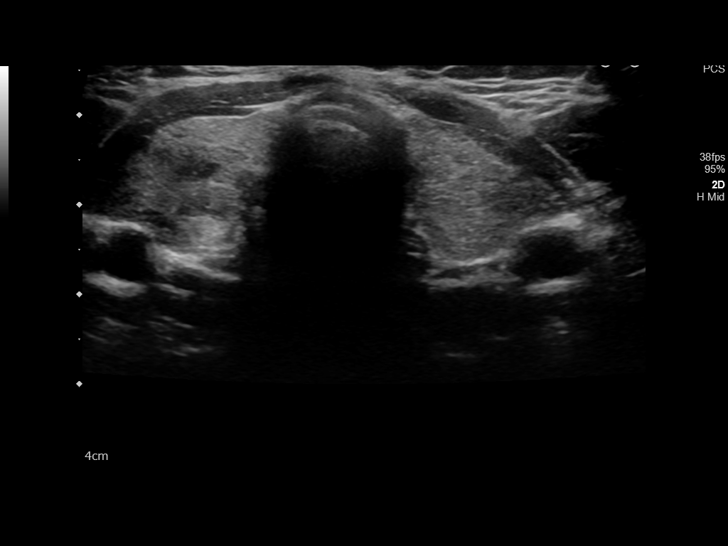
[im 6/61]
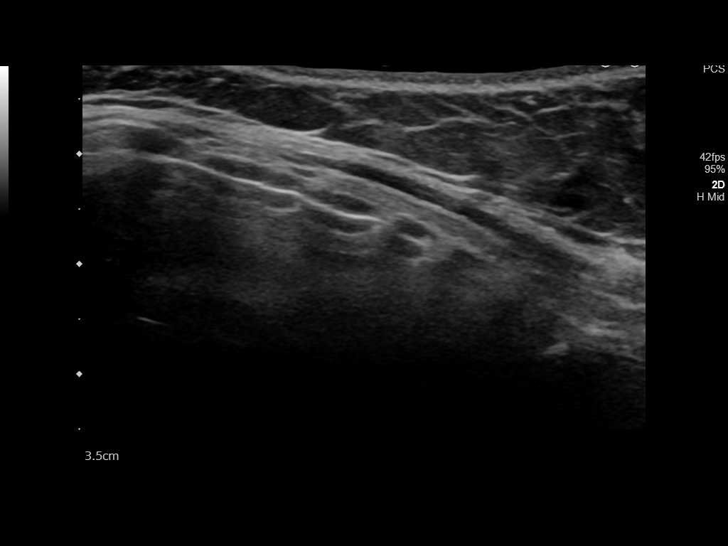
[im 11/61]
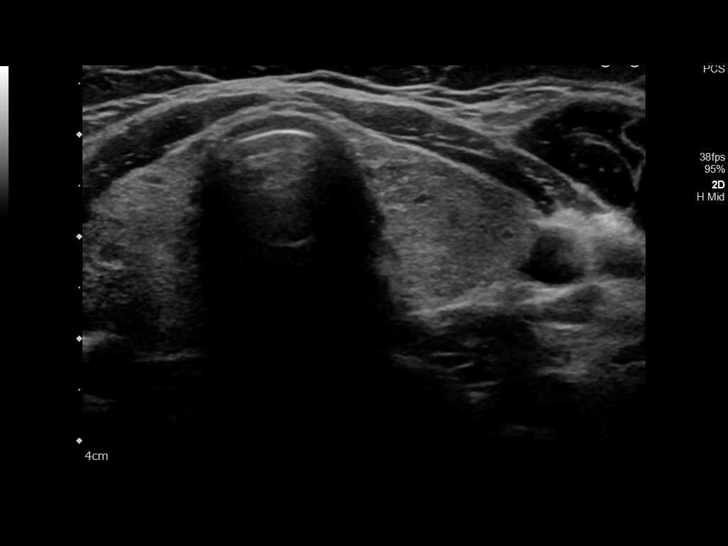
[im 16/61]
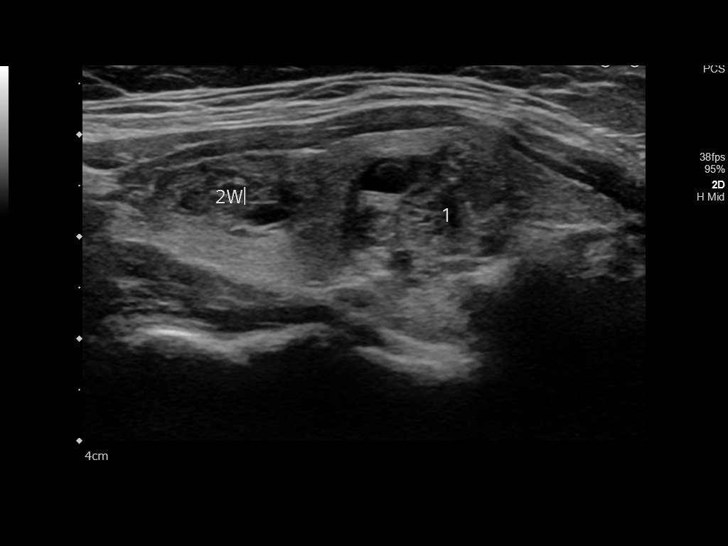
[im 21/61]
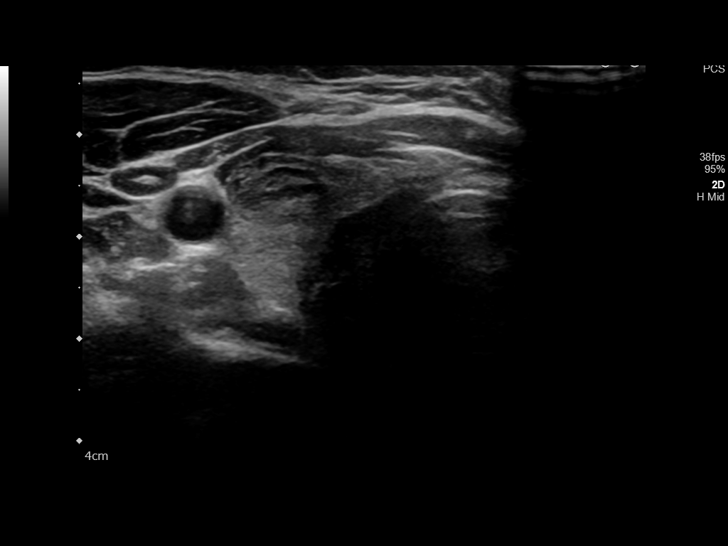
[im 26/61]
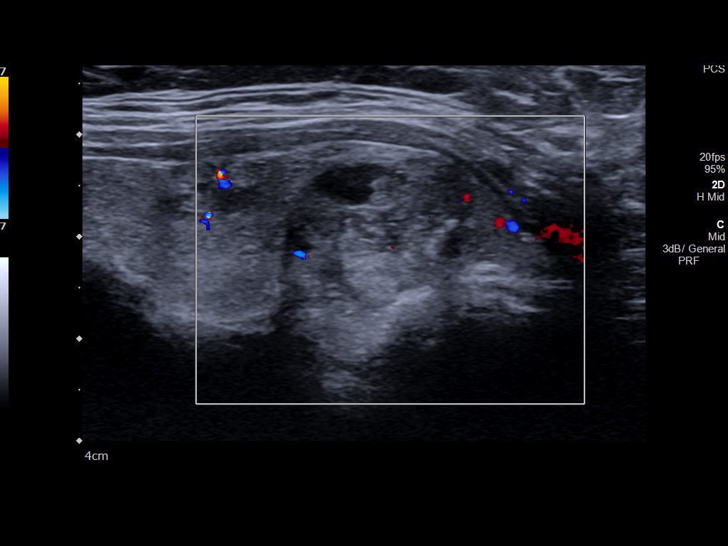
[im 31/61]
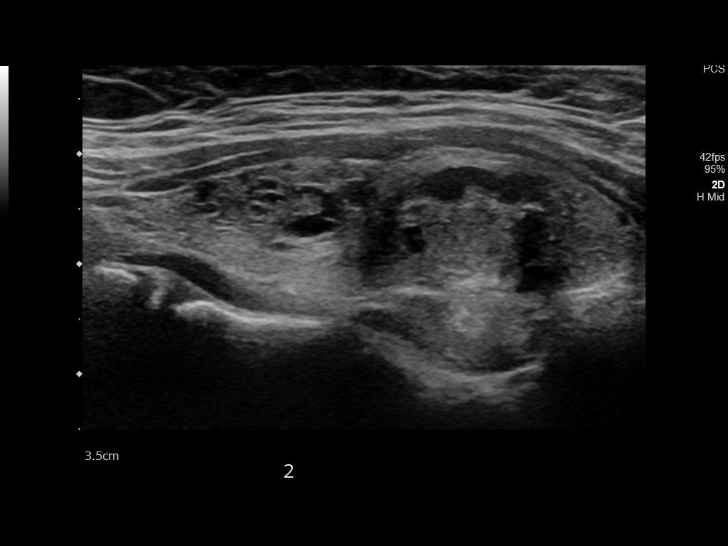
[im 36/61]
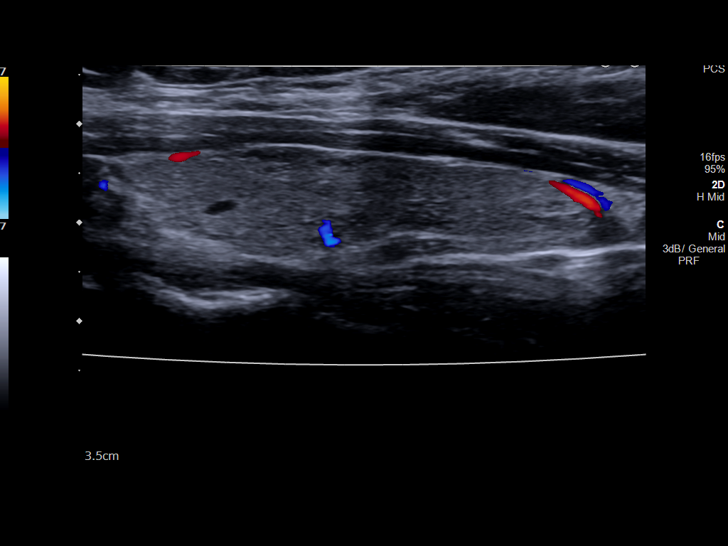
[im 41/61]
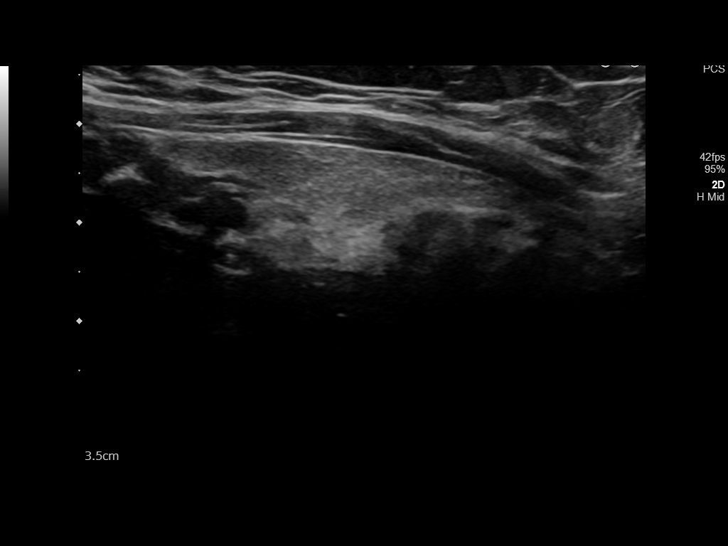
[im 46/61]
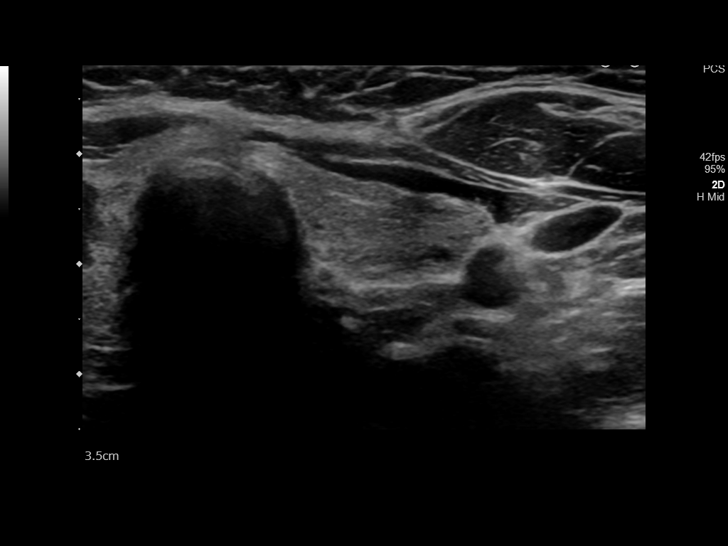
[im 51/61]
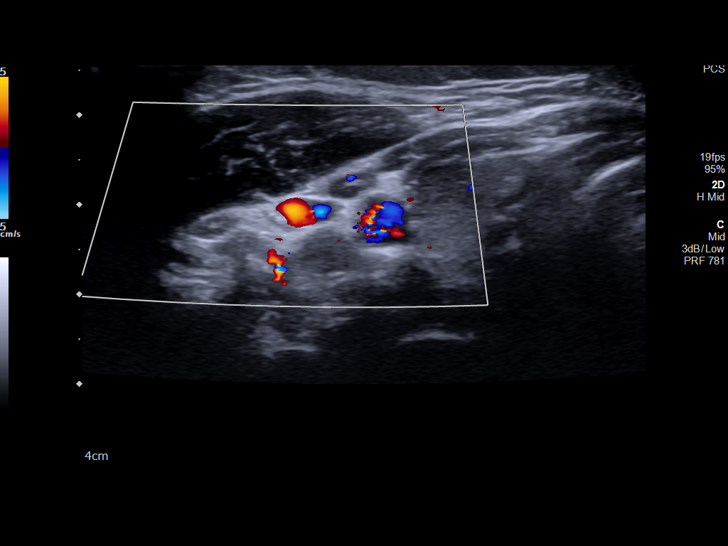
[im 56/61]
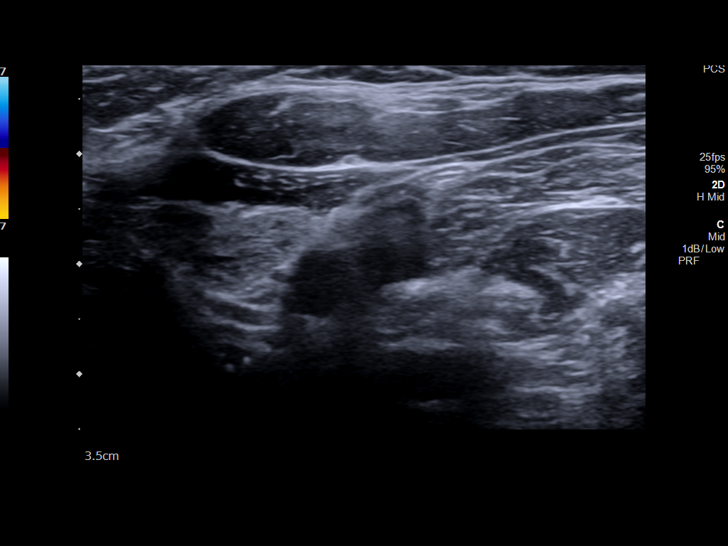
[im 61/61]
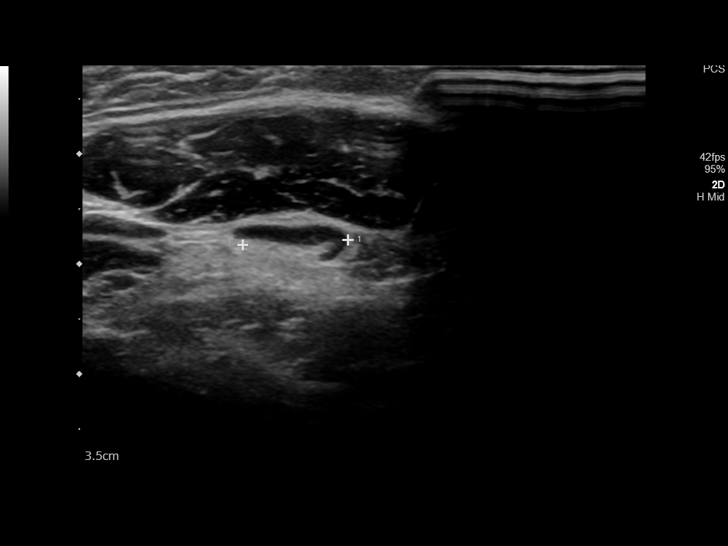

[13 of 25 positions shown; findings below may reference images not displayed]

FINDINGS: Parenchymal Echotexture: Mildly heterogenous

Isthmus: 0.3 cm, previously 0.2 cm

Right lobe: 5.4 x 2.1 x 2.1 cm, previously 4.9 x 2.0 x 1.9 cm

Left lobe: 5.2 x 1.5 x 1.2 cm, previously 4.9 x 1.6 x 1.0 cm

_________________________________________________________

Estimated total number of nodules >/= 1 cm: 2

Number of spongiform nodules >/=  2 cm not described below (TR1): 0

Number of mixed cystic and solid nodules >/= 1.5 cm not described
below (TR2): 0

_________________________________________________________

Again noted is a poorly defined heterogeneous nodule in the inferior
right thyroid lobe that was previously biopsied. This is labeled as
nodule #1. This nodule measures 2.5 x 1.4 x 1.7 cm and previously
measured 1.8 x 1.4 x 1.4 cm. This nodule has a few small cystic
components.

Nodule # 2:

Prior biopsy: No

Location: Right; Superior

Maximum size: 1.7 cm; Other 2 dimensions: 0.8 x 1.1 cm, previously,
1.4 x 0.5 x 0.8 cm

Composition: cannot determine (2)

Echogenicity: cannot determine (1)

Shape: not taller-than-wide (0)

Margins: ill-defined (0)

Echogenic foci: none (0)

ACR TI-RADS total points: 3.

ACR TI-RADS risk category:  TR3 (3 points).

Significant change in size (>/= 20% in two dimensions and minimal
increase of 2 mm): Yes

Change in features: Yes. Nodule is more isoechoic than the previous
examination.

Change in ACR TI-RADS risk category: No

ACR TI-RADS recommendations:

*Given size (>/= 1.5 - 2.4 cm) and appearance, a follow-up
ultrasound in 1 year should be considered based on TI-RADS criteria.

_________________________________________________________

Two small hypoechoic nodules or cysts in the left thyroid lobe,
largest measuring 0.3 cm. The small left thyroid nodules do not meet
criteria for biopsy or dedicated follow-up.

Mildly prominent lymph node along the left side of the neck
measuring 0.7 cm in the short axis. Overall, no significant lymph
node enlargement.
IMPRESSION: Interval enlargement of the two right thyroid nodules. Nodule #1 in
the inferior right thyroid lobe has been previously biopsied and
consistent with a Hurtle cell lesion, [REDACTED] class 3.

Nodule #2 in the superior right thyroid lobe is poorly defined and
difficult to characterize the composition. This nodule meets
criteria for 1 year follow-up.

The above is in keeping with the ACR TI-RADS recommendations - [HOSPITAL] 0921;[DATE].

## 2019-01-26 DIAGNOSIS — L74519 Primary focal hyperhidrosis, unspecified: Secondary | ICD-10-CM | POA: Diagnosis not present

## 2019-01-26 DIAGNOSIS — Z872 Personal history of diseases of the skin and subcutaneous tissue: Secondary | ICD-10-CM | POA: Diagnosis not present

## 2019-01-26 DIAGNOSIS — Z09 Encounter for follow-up examination after completed treatment for conditions other than malignant neoplasm: Secondary | ICD-10-CM | POA: Diagnosis not present

## 2019-01-26 DIAGNOSIS — L700 Acne vulgaris: Secondary | ICD-10-CM | POA: Diagnosis not present

## 2019-02-01 ENCOUNTER — Telehealth: Payer: Self-pay | Admitting: Internal Medicine

## 2019-02-01 NOTE — Telephone Encounter (Signed)
Left message for pt

## 2019-02-01 NOTE — Telephone Encounter (Signed)
Called and spoke to pt.  Pt is requesting that labs be drawn, as ordered by Dr. Ashby Dawes at last [place].  Pt last seen 05/2017. I have made pt aware that OV will be needed with another provider prior to labs, as DR is no longer with our practice.  Pt has been scheduled for OV on 03/03/2019.

## 2019-02-01 NOTE — Telephone Encounter (Signed)
Pt would like a call back at [phone]

## 2019-02-02 ENCOUNTER — Telehealth: Payer: Self-pay | Admitting: Family Medicine

## 2019-02-02 ENCOUNTER — Encounter: Payer: Medicaid Other | Admitting: Family Medicine

## 2019-02-02 NOTE — Telephone Encounter (Signed)
Pt does not want an appt unless emily recommends. Pt has history of RLS and both legs are hurting her. Pt has an appt with pulmonologist on 03-03-2019. Pt would like something call into [facility]. Pt has tried tylenol with little relief. Pt still has dull pain

## 2019-02-03 NOTE — Telephone Encounter (Signed)
Please make patient appointment to be seen

## 2019-02-03 NOTE — Telephone Encounter (Signed)
Please advise 

## 2019-02-03 NOTE — Telephone Encounter (Addendum)
Unfortunately, I have only seen her 1 time over a year ago, and there is no documentation about RLS in her chart.  She will need an appointment to discuss.

## 2019-02-07 ENCOUNTER — Other Ambulatory Visit: Payer: Self-pay

## 2019-02-07 ENCOUNTER — Ambulatory Visit (INDEPENDENT_AMBULATORY_CARE_PROVIDER_SITE_OTHER): Payer: Medicaid Other | Admitting: Family Medicine

## 2019-02-07 ENCOUNTER — Encounter: Payer: Self-pay | Admitting: Family Medicine

## 2019-02-07 DIAGNOSIS — M79604 Pain in right leg: Secondary | ICD-10-CM | POA: Diagnosis not present

## 2019-02-07 DIAGNOSIS — E669 Obesity, unspecified: Secondary | ICD-10-CM

## 2019-02-07 DIAGNOSIS — M79605 Pain in left leg: Secondary | ICD-10-CM | POA: Diagnosis not present

## 2019-02-07 MED ORDER — GABAPENTIN 100 MG PO CAPS: ORAL_CAPSULE | ORAL | 1 refills | Status: DC

## 2019-02-07 NOTE — Progress Notes (Signed)
Name: Alexandria [name]   MRN: [id number]    DOB: 08-Feb-1986   Date:02/07/2019       Progress Note  Subjective  Chief Complaint  Chief Complaint  Patient presents with  . Knee Pain    bilateral  . Ankle Pain    bilateral    I connected with  [name]  on 02/07/19 at  1:40 PM EDT by a video enabled telemedicine application and verified that I am speaking with the correct person using two identifiers.  I discussed the limitations of evaluation and management by telemedicine and the availability of in person appointments. The patient expressed understanding and agreed to proceed. Staff also discussed with the patient that there may be a patient responsible charge related to this service. Patient Location: [facility] (parked) Provider Location: Office Additional Individuals present: None  HPI  Pt presents with concern for bilateral leg pain, history of RLS since she was in grade school, however symptoms have been worsening lately.  She has tried gabapentin in the past and this did help her symptoms, though she is no longer taking this.  She is taking an iron supplement and is seeing Pulmonology for low ferritin.  She has never had any numbness/tingling, no history of nerve conduction studies in the past, no joint swelling.  Pain is described as a dull aching, worse at night, does occasionally get joint stiffness (hands, knees, and low back) with overuse.    Obesity: She has been losing some weight - walking more and trying limit portions, staying away from sweets and eating healthier.   Patient Active Problem List   Diagnosis Date Noted  . Oligomenorrhea 01/08/2018  . Thyroid nodule 08/07/2017  . Postpartum care following vaginal delivery 04/21/2017  . Obesity (BMI 35.0-39.9 without comorbidity) 07/14/2016  . Depression, recurrent ([place]) 07/11/2016  . Neoplasm of uncertain behavior of skin 05/26/2016  . Verruca vulgaris 10/18/2015  . 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 Anxiety 10/06/2012  . CIN III (cervical intraepithelial neoplasia grade III) with severe dysplasia 08/06/2012    Social History   Tobacco Use  . Smoking status: Former Research scientist (life sciences)  . Smokeless tobacco: Never Used  Substance Use Topics  . Alcohol use: No    Alcohol/week: 0.0 standard drinks    Current Outpatient Medications:  .  busPIRone (BUSPAR) 15 MG tablet, Take 1 tablet (15 mg total) by mouth 3 (three) times daily., Disp: 270 tablet, Rfl: 3 .  ferrous sulfate 325 (65 FE) MG tablet, Take 325 mg by mouth daily with breakfast., Disp: , Rfl:  .  FLUoxetine (PROZAC) 20 MG capsule, TAKE 3 CAPSULES (60 MG TOTAL) BY MOUTH DAILY. (FOR MOOD STOP ESCITALOPRAM), Disp: 90 capsule, Rfl: 6 .  glycopyrrolate (ROBINUL) 1 MG tablet, Take 2 mg by mouth daily., Disp: , Rfl: 2 .  Multiple Vitamins-Calcium (ONE-A-DAY WOMENS PO), Take 1 tablet by mouth., Disp: , Rfl:  .  Prenatal Multivit-Min-Fe-FA (PRENATAL VITAMINS PO), Take by mouth., Disp: , Rfl:   No Known Allergies  I personally reviewed active problem list, medication list, allergies, lab results with the patient/caregiver today.  ROS  Ten systems reviewed and is negative except as mentioned in HPI  Objective  Virtual encounter, vitals not obtained.  There is no height or weight on file to calculate BMI.  Nursing Note and Vital Signs reviewed.  Physical Exam  Constitutional: Patient appears well-developed and well-nourished. No distress.  HENT: Head: Normocephalic and atraumatic.  Neck: Normal range  of motion. Pulmonary/Chest: Effort normal. No respiratory distress. Speaking in complete sentences Neurological: Pt is alert and oriented to person, place, and time. Coordination, speech are normal.  Psychiatric: Patient has a normal mood and affect. behavior is normal. Judgment and thought content normal.  No results found for this or any previous visit (from the past 72 hour(s)).  Assessment & Plan  1.  Bilateral leg pain - gabapentin (NEURONTIN) 100 MG capsule; Take 1 tab each night x7 days, then increase to 1 tab twice daily x7 days, then increase to 1 tab three times daily ongoing.  Dispense: 90 capsule; Refill: 1  2. Obesity (BMI 35.0-39.9 without comorbidity) - Discussed importance of 150 minutes of physical activity weekly, eat two servings of fish weekly, eat one serving of tree nuts ( cashews, pistachios, pecans, almonds.Marland Kitchen) every other day, eat 6 servings of fruit/vegetables daily and drink plenty of water and avoid sweet beverages.  - gabapentin (NEURONTIN) 100 MG capsule; Take 1 tab each night x7 days, then increase to 1 tab twice daily x7 days, then increase to 1 tab three times daily ongoing.  Dispense: 90 capsule; Refill: 1   -Red flags and when to present for emergency care or RTC including fever >101.90F, chest pain, shortness of breath, new/worsening/un-resolving symptoms, reviewed with patient at time of visit. Follow up and care instructions discussed and provided in AVS. - I discussed the assessment and treatment plan with the patient. The patient was provided an opportunity to ask questions and all were answered. The patient agreed with the plan and demonstrated an understanding of the instructions.  I provided 11 minutes of non-face-to-face time during this encounter.  Hubbard Hartshorn, FNP

## 2019-02-17 ENCOUNTER — Encounter: Payer: Self-pay | Admitting: Family Medicine

## 2019-02-18 ENCOUNTER — Other Ambulatory Visit: Payer: Self-pay

## 2019-02-18 ENCOUNTER — Telehealth: Payer: Self-pay | Admitting: Emergency Medicine

## 2019-02-18 ENCOUNTER — Ambulatory Visit
Admission: EM | Admit: 2019-02-18 | Discharge: 2019-02-18 | Disposition: A | Discharge status: Home / Self Care | Payer: Medicaid Other | Attending: Family Medicine | Admitting: Family Medicine

## 2019-02-18 ENCOUNTER — Encounter: Payer: Self-pay | Admitting: Emergency Medicine

## 2019-02-18 DIAGNOSIS — B9689 Other specified bacterial agents as the cause of diseases classified elsewhere: Secondary | ICD-10-CM | POA: Diagnosis not present

## 2019-02-18 DIAGNOSIS — N898 Other specified noninflammatory disorders of vagina: Secondary | ICD-10-CM | POA: Diagnosis not present

## 2019-02-18 DIAGNOSIS — B3731 Acute candidiasis of vulva and vagina: Secondary | ICD-10-CM

## 2019-02-18 DIAGNOSIS — B373 Candidiasis of vulva and vagina: Secondary | ICD-10-CM

## 2019-02-18 DIAGNOSIS — N760 Acute vaginitis: Secondary | ICD-10-CM

## 2019-02-18 LAB — WET PREP, GENITAL
Sperm: NONE SEEN
Trich, Wet Prep: NONE SEEN

## 2019-02-18 MED ORDER — METRONIDAZOLE 500 MG PO TABS: 500.0000 mg | ORAL_TABLET | Freq: Two times a day (BID) | ORAL | 0 refills | Status: DC

## 2019-02-18 MED ORDER — FLUCONAZOLE 150 MG PO TABS: ORAL_TABLET | ORAL | 0 refills | Status: DC

## 2019-02-18 NOTE — Discharge Instructions (Signed)
It was very nice seeing you today in clinic. Thank you for entrusting me with your care.   Please utilize the medications that we discussed. Your prescriptions has been called in to your pharmacy. Increase fluid intake to keep urinary tract flushed in order to prevent developing a UTI.   Make arrangements to follow up with your regular doctor in 1 week for re-evaluation if not improving. If your symptoms/condition worsens, please seek follow up care either here or in the ER. Please remember, our [facility] providers are "right here with you" when you need [place].   Again, it was my pleasure to take care of you today. Thank you for choosing our clinic. I hope that you start to feel better quickly.   Honor Loh, MSN, APRN, FNP-C, CEN Advanced Practice Provider [facility]

## 2019-02-18 NOTE — Telephone Encounter (Signed)
See my chart encounter.

## 2019-02-18 NOTE — Telephone Encounter (Signed)
Would like something called in for yeast. Think it is BV again. Have a odor and a discharge

## 2019-02-18 NOTE — Telephone Encounter (Signed)
Copied from [place] [id number]. Topic: General - Other >> Feb 18, 2019 12:54 PM Mcneil, Ja-Kwan wrote: Reason for CRM: Pt stated she would like for a Rx for Flagyl to call in due to a yeast infection. Pt requests call back

## 2019-02-18 NOTE — ED Triage Notes (Signed)
Patient c/o vaginal discharge and itching that started a week ago.  Patient denies any pain.

## 2019-02-19 NOTE — ED Provider Notes (Signed)
Alexandria [name], [place]   Name: Alexandria [name] DOB: [birth year] MRN: [id number] CSN: [id number] PCP: Arnetha Courser, MD  Arrival date and time:  02/18/19 1712  Chief Complaint:  Vaginal Discharge and Vaginal Itching   NOTE: Prior to seeing the patient today, I have reviewed the triage nursing documentation and vital signs. Clinical staff has updated patient's PMH/PSHx, current medication list, and drug allergies/intolerances to ensure comprehensive history available to assist in medical decision making.   History:   HPI: Alexandria [name] is a 33 y.o. female who presents today with complaints of vaginal discharge that began approximately 1 week ago. She has been using a retained supply of Nystatin cream without improvement of her symptoms; "it just caused me to be more irritated down there". Patient advising that she has been taking more bubble baths with her daughter, which generally result in her getting an infection. Additionally, patient reports that she recent used a new "aloe fragrance" feminine wash, which intensified her symptoms. Discharge is reported to be white in color and as having a significant odor. She has intense vaginal itching. She denies any associated vaginal/pelvic pain. She has not appreciated any bleeding. Patient has not experienced any concurrent urinary symptoms; no dysuria, frequency, urgency, or gross hematuria. Patient denies any associated nausea, vomiting, fever/chills, or pain in her lower back, flank area, or abdomen. Patient advises that she does not have a significant history for STIs in the past, but has had bacterial vaginosis (BV) before. Patient endorses that she engages in unprotected sexual activity. She notes that sexual activity is in the context of a committed monogamous relationship with a single female partner. Patient's last menstrual period was 02/04/2019 (approximate). There are no concerns that she is currently pregnant.   Past Medical History:  Diagnosis  Date  . Anxiety   . CIN III (cervical intraepithelial neoplasia III)    2011 or 2012-LEEP  . Depression   . High risk HPV infection    followed by GYN  . Multinodular goiter     Past Surgical History:  Procedure Laterality Date  . 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 CERVICAL BIOPSY  W/ LOOP ELECTRODE EXCISION  2014   [facility]  . COLPOSCOPY  06/15/2012   [facility], Dr Ardell Isaacs: CIN III    Family History  Problem Relation Age of Onset  . Insomnia Mother   . Colon cancer Mother 58       colon cancer  . Cervical cancer Maternal Aunt        Cervical Cancer  . Colon cancer Paternal Aunt        Colon cancer  . Asthma Brother   . Diabetes Maternal Grandmother   . Diabetes Maternal Grandfather     Social History   Tobacco Use  . Smoking status: Former Research scientist (life sciences)  . Smokeless tobacco: Never Used  Substance Use Topics  . Alcohol use: No    Alcohol/week: 0.0 standard drinks  . Drug use: No    Patient Active Problem List   Diagnosis Date Noted  . Oligomenorrhea 01/08/2018  . Thyroid nodule 08/07/2017  . Obesity (BMI 35.0-39.9 without comorbidity) 07/14/2016  . Depression, recurrent ([place]) 07/11/2016  . Neoplasm of uncertain behavior of skin 05/26/2016  . Verruca vulgaris 10/18/2015  . 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 Anxiety 10/06/2012  . CIN III (cervical intraepithelial neoplasia grade III) with severe dysplasia 08/06/2012    Home Medications:  Current Meds  Medication Sig  . busPIRone (BUSPAR) 15 MG tablet Take 1 tablet (15 mg total) by mouth 3 (three) times daily.  . ferrous sulfate 325 (65 FE) MG tablet Take 325 mg by mouth daily with breakfast.  . FLUoxetine (PROZAC) 20 MG capsule TAKE 3 CAPSULES (60 MG TOTAL) BY MOUTH DAILY. (FOR MOOD STOP ESCITALOPRAM)  . gabapentin (NEURONTIN) 100 MG capsule Take 1 tab each night x7 days, then increase to 1 tab twice daily x7 days, then  increase to 1 tab three times daily ongoing.  . Multiple Vitamins-Calcium (ONE-A-DAY WOMENS PO) Take 1 tablet by mouth.    Allergies:   Patient has no known allergies.  Review of Systems (ROS): Review of Systems  Constitutional: Negative for chills and fever.  Respiratory: Negative for cough and shortness of breath.   Cardiovascular: Negative for chest pain and palpitations.  Gastrointestinal: Negative for abdominal pain, nausea and vomiting.  Genitourinary: Positive for vaginal discharge. Negative for decreased urine volume, dysuria, frequency, hematuria, pelvic pain, urgency, vaginal bleeding and vaginal pain.       (+) vaginal itching  Musculoskeletal: Negative for back pain.  Skin: Negative for color change, pallor and rash.  Neurological: Negative for dizziness, syncope, weakness and headaches.  All other systems reviewed and are negative.    Vital Signs: Today's Vitals   02/18/19 1719 02/18/19 1722 02/18/19 1759  BP:  101/63   Pulse:  84   Resp:  14   Temp:  98.3 F (36.8 C)   TempSrc:  Oral   SpO2:  99%   Weight: 210 lb (95.3 kg)    Height: 5\' 4"  (1.626 m)    PainSc: 0-No pain  0-No pain    Physical Exam: Physical Exam  Constitutional: She is oriented to person, place, and time and well-developed, well-nourished, and in no distress.  HENT:  Head: Normocephalic and atraumatic.  Mouth/Throat: Mucous membranes are normal.  Eyes: Pupils are equal, round, and reactive to light.  Cardiovascular: Normal rate, regular rhythm, normal heart sounds and intact distal pulses. Exam reveals no gallop.  Pulmonary/Chest: Effort normal and breath sounds normal. No respiratory distress.  Abdominal: Soft. Normal appearance and bowel sounds are normal. She exhibits no distension. There is no abdominal tenderness.  Genitourinary:    Genitourinary Comments: Exam deferred. No vaginal/pelvic pain or bleeding. Patient is not currently pregnant. She has elected to self collect specimen swab  for wet prep.   Neurological: She is alert and oriented to person, place, and time. Gait normal.  Skin: Skin is warm and dry. No rash noted.  Psychiatric: Mood, memory, affect and judgment normal.  Nursing note and vitals reviewed.   Urgent Care Treatments / Results:   LABS: PLEASE NOTE: all labs that were ordered this encounter are listed, however only abnormal results are displayed. Labs Reviewed  WET PREP, GENITAL - Abnormal; Notable for the following components:      Result Value   Yeast Wet Prep HPF POC PRESENT (*)    Clue Cells Wet Prep HPF POC PRESENT (*)    WBC, Wet Prep HPF POC MODERATE (*)    All other components within normal limits    EKG: -None  RADIOLOGY: No results found.  PROCEDURES: Procedures  MEDICATIONS RECEIVED THIS VISIT: Medications - No data to display  PERTINENT CLINICAL COURSE NOTES/UPDATES:   Initial Impression / Assessment and Plan / Urgent Care Course:  Pertinent labs & imaging results that were available during my care of the patient were  personally reviewed by me and considered in my medical decision making (see lab/imaging section of note for values and interpretations).  Alexandria [name] is a 33 y.o. female who presents to [facility] today with complaints of Vaginal Discharge and Vaginal Itching   Patient is well appearing overall in clinic today. She does not appear to be in any acute distress. Presenting symptoms (see HPI) and exam as documented above.  Wet prep (+) for clue cells, which is consistent with bacterial vaginosis (BV) infection. Will treat with a 7 day course of oral metronidazole. Patient encouraged to complete the entire course of antibiotics even if she begins to feel better. Educated on need to avoid all ETOH while she is taking this medication in order to prevent a disulfiram like reaction that will result in significant nausea and vomiting. Patient encouraged to increase her fluid intake as much as possible.  Discussed that water is always best to flush the urinary tract and prevent development of a urinary tract infection while on treatment for the BV. Additionally, wet prep sample also (+) for candida consistent with vulvovaginal candidiasis. Will send in fluconazole dose (150 mg x 1 - may repeat in 72 hours if still symptomatic) for patient to use for the vaginal itching. Education provided regarding causes of these infections. Discussed altered vaginal pH associated with bubble baths and scented soaps/feminine washes. Encouraged to avoid both is possible.   Discussed follow up with primary care physician in 1 week for re-evaluation. I have reviewed the follow up and strict return precautions for any new or worsening symptoms. Patient is aware of symptoms that would be deemed urgent/emergent, and would thus require further evaluation either here or in the emergency department. At the time of discharge, she verbalized understanding and consent with the discharge plan as it was reviewed with her. All questions were fielded by provider and/or clinic staff prior to patient discharge.    Final Clinical Impressions / Urgent Care Diagnoses:   Final diagnoses:  BV (bacterial vaginosis)  Vaginal discharge  Vaginal itching  Vulvovaginal candidiasis    New Prescriptions:  [place] Controlled Substance Registry consulted? Not Applicable  Meds ordered this encounter  Medications  . fluconazole (DIFLUCAN) 150 MG tablet    Sig: Take 1 tablet (150 mg) PO x 1 dose. May repeat 150 mg dose in 3 days if still symptomatic.    Dispense:  2 tablet    Refill:  0  . metroNIDAZOLE (FLAGYL) 500 MG tablet    Sig: Take 1 tablet (500 mg total) by mouth 2 (two) times daily.    Dispense:  14 tablet    Refill:  0    Recommended Follow up Care:  Patient encouraged to follow up with the following provider within the specified time frame, or sooner as dictated by the severity of her symptoms. As always, she was instructed that for  any urgent/emergent care needs, she should seek care either here or in the emergency department for more immediate evaluation.  Follow-up Information    Lada, Satira Anis, MD In 1 week.   Specialty: Family Medicine Why: General reassessment of symptoms if not improving Contact information: [address] [place] [address] [phone]         NOTE: This note was prepared using Dragon dictation software along with smaller phrase technology. Despite my best ability to proofread, there is the potential that transcriptional errors may still occur from this process, and are completely unintentional.  Karen Kitchens, NP 02/19/19 [phone]

## 2019-02-23 ENCOUNTER — Telehealth: Payer: Self-pay

## 2019-02-23 NOTE — Telephone Encounter (Signed)
Received paperwork from Disability requesting records (patient attempting to qualify for disability). Documents faxed to [employer].  Will route to triage to f/u at patient appt.

## 2019-03-03 ENCOUNTER — Other Ambulatory Visit: Payer: Self-pay | Admitting: Internal Medicine

## 2019-03-03 ENCOUNTER — Encounter: Payer: Self-pay | Admitting: Family Medicine

## 2019-03-03 ENCOUNTER — Other Ambulatory Visit: Payer: Self-pay

## 2019-03-03 ENCOUNTER — Other Ambulatory Visit
Admission: RE | Admit: 2019-03-03 | Discharge: 2019-03-03 | Disposition: A | Discharge status: Home / Self Care | Payer: Medicaid Other | Source: Ambulatory Visit | Attending: Internal Medicine | Admitting: Internal Medicine

## 2019-03-03 ENCOUNTER — Encounter: Payer: Self-pay | Admitting: Internal Medicine

## 2019-03-03 ENCOUNTER — Ambulatory Visit: Payer: Medicaid Other | Admitting: Internal Medicine

## 2019-03-03 VITALS — BP 112/70 | HR 94 | Temp 97.0°F | Ht 64.0 in | Wt 234.0 lb

## 2019-03-03 DIAGNOSIS — G2581 Restless legs syndrome: Secondary | ICD-10-CM | POA: Diagnosis not present

## 2019-03-03 DIAGNOSIS — M130 Polyarthritis, unspecified: Secondary | ICD-10-CM

## 2019-03-03 DIAGNOSIS — M2550 Pain in unspecified joint: Secondary | ICD-10-CM

## 2019-03-03 DIAGNOSIS — M1990 Unspecified osteoarthritis, unspecified site: Secondary | ICD-10-CM

## 2019-03-03 LAB — FERRITIN: Ferritin: 39 ng/mL (ref 11–307)

## 2019-03-03 NOTE — Patient Instructions (Signed)
Check Ferritin level Recommend Rheum otology consultation

## 2019-03-03 NOTE — Telephone Encounter (Signed)
This matter was dicussed during 03/03/2019 phone visit.

## 2019-03-03 NOTE — Progress Notes (Signed)
  [facility] Consultation        Date: 03/03/2019  MRN# [id number] ERNESHA SCRIPTURE 08-Jan-1986  Referring Physician: Dr. lada for snoring.   Alexandria [name] is a 33 y.o. old female seen in consultation for chief complaint of:   restless leg syndrome  HPI:  Patient does not have evidence of sleep apnea according to records Patient does have restless leg syndrome Sleep study previously showed predominantly spontaneous arousals with an arousal index of 5 an hour noted to have restless leg syndrome without significant arousals She reached adequate sleep time and REM during the sleep study Patient does have intermittent joint pain and swelling in the ankles and feet She does have some stiffness every now and then Bilateral wrist pain occasionally Patient needs to see rheumatology for assessment   Medication:   Reviewed, includes prozac.    Allergies:  Patient has no known allergies.   Review of Systems:  Gen:  Denies  fever, sweats, chills weight loss  HEENT: Denies blurred vision, double vision, ear pain, eye pain, hearing loss, nose bleeds, sore throat Cardiac:  No dizziness, chest pain or heaviness, chest tightness,edema, No JVD Resp:   No cough, -sputum production, -shortness of breath,-wheezing, -hemoptysis,  Gi: Denies swallowing difficulty, stomach pain, nausea or vomiting, diarrhea, constipation, bowel incontinence Gu:  Denies bladder incontinence, burning urine Ext:   Denies Joint pain, stiffness or swelling Skin: Denies  skin rash, easy bruising or bleeding or hives Endoc:  Denies polyuria, polydipsia , polyphagia or weight change Psych:   Denies depression, insomnia or hallucinations  Other:  All other systems negative Pulse 94   LMP 02/04/2019 (Approximate)   SpO2 97%    Physical Examination:   GENERAL:NAD, no fevers, chills, no weakness no fatigue HEAD: Normocephalic, atraumatic.  EYES: PERLA, EOMI No scleral icterus.  MOUTH: Moist  mucosal membrane.  EAR, NOSE, THROAT: Clear without exudates. No external lesions.  NECK: Supple. No thyromegaly.  No JVD.  PULMONARY: CTA B/L no wheezing, rhonchi, crackles CARDIOVASCULAR: S1 and S2. Regular rate and rhythm. No murmurs GASTROINTESTINAL: Soft, nontender, nondistended. Positive bowel sounds.  MUSCULOSKELETAL: No swelling, clubbing, or edema.  NEUROLOGIC: No gross focal neurological deficits. 5/5 strength all extremities SKIN: No ulceration, lesions, rashes, or cyanosis.  PSYCHIATRIC: Insight, judgment intact. -depression -anxiety ALL OTHER ROS ARE NEGATIVE      Assessment and Plan:  Patient does not have any respiratory or cardiac issues at this time  Excessive daytime sleepiness from restless leg syndrome Patient will need ferritin levels to assess adequacy of iron therapy Continue iron therapy as prescribed  Polyarthritis and arthralgias Associated with stiffness Recommend rheumatology referral   COVID-19 EDUCATION: The signs and symptoms of COVID-19 were discussed with the patient and how to seek care for testing.  The importance of social distancing was discussed today. Hand Washing Techniques and avoid touching face was advised.     MEDICATION ADJUSTMENTS/LABS AND TESTS ORDERED: Iron supplementation Rheumatology referral  CURRENT MEDICATIONS REVIEWED AT LENGTH WITH PATIENT TODAY   Patient satisfied with Plan of action and management. All questions answered  Follow up as needed   Corrin Parker, M.D.  [facility]  Medical Director [place] Director [facility] Cardio-Pulmonary Department

## 2019-03-10 ENCOUNTER — Other Ambulatory Visit: Payer: Self-pay

## 2019-03-10 ENCOUNTER — Ambulatory Visit: Payer: Medicaid Other | Admitting: Family Medicine

## 2019-03-10 ENCOUNTER — Encounter: Payer: Self-pay | Admitting: Family Medicine

## 2019-03-10 VITALS — BP 120/70 | HR 87 | Temp 97.9°F | Resp 14 | Ht 64.0 in | Wt 236.2 lb

## 2019-03-10 DIAGNOSIS — Z8742 Personal history of other diseases of the female genital tract: Secondary | ICD-10-CM

## 2019-03-10 DIAGNOSIS — Z131 Encounter for screening for diabetes mellitus: Secondary | ICD-10-CM

## 2019-03-10 DIAGNOSIS — Z1159 Encounter for screening for other viral diseases: Secondary | ICD-10-CM

## 2019-03-10 DIAGNOSIS — Z862 Personal history of diseases of the blood and blood-forming organs and certain disorders involving the immune mechanism: Secondary | ICD-10-CM

## 2019-03-10 DIAGNOSIS — E669 Obesity, unspecified: Secondary | ICD-10-CM

## 2019-03-10 DIAGNOSIS — Z13220 Encounter for screening for lipoid disorders: Secondary | ICD-10-CM | POA: Diagnosis not present

## 2019-03-10 DIAGNOSIS — M79605 Pain in left leg: Secondary | ICD-10-CM | POA: Diagnosis not present

## 2019-03-10 DIAGNOSIS — Z0000 Encounter for general adult medical examination without abnormal findings: Secondary | ICD-10-CM | POA: Diagnosis not present

## 2019-03-10 DIAGNOSIS — F419 Anxiety disorder, unspecified: Secondary | ICD-10-CM | POA: Diagnosis not present

## 2019-03-10 DIAGNOSIS — D069 Carcinoma in situ of cervix, unspecified: Secondary | ICD-10-CM | POA: Diagnosis not present

## 2019-03-10 DIAGNOSIS — M79604 Pain in right leg: Secondary | ICD-10-CM | POA: Diagnosis not present

## 2019-03-10 DIAGNOSIS — E042 Nontoxic multinodular goiter: Secondary | ICD-10-CM

## 2019-03-10 DIAGNOSIS — Z800 Family history of malignant neoplasm of digestive organs: Secondary | ICD-10-CM

## 2019-03-10 MED ORDER — GABAPENTIN 100 MG PO CAPS: ORAL_CAPSULE | ORAL | 0 refills | Status: DC

## 2019-03-10 MED ORDER — HYDROXYZINE PAMOATE 25 MG PO CAPS: 25.0000 mg | ORAL_CAPSULE | Freq: Three times a day (TID) | ORAL | 0 refills | Status: DC | PRN

## 2019-03-10 NOTE — Progress Notes (Signed)
Name: Alexandria [name]   MRN: [id number]    DOB: February 07, 1986   Date:03/10/2019       Progress Note  Subjective  Chief Complaint  Chief Complaint  Patient presents with  . Annual Exam    HPI  Patient presents for annual CPE.  Diet: Has not been doing great with her diet and exercise lately.  Eating a lot of carbs lately. Eating some vegetables.  Eats out 4 times a week.  Exercise:   USPSTF grade A and B recommendations    Office Visit from 03/10/2019 in [facility]  AUDIT-C Score  0     Depression: Phq 9 is  Positive - states she feels no worse than usual; still taking her buspar and prozac.  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 She does have some times of very high anxiety, but no panic attacks. Depression screen [facility] 2/9 03/10/2019 02/07/2019 10/04/2018 05/20/2018 11/17/2017  Decreased Interest 1 0 0 0 0  Down, Depressed, Hopeless 0 0 0 0 0  PHQ - 2 Score 1 0 0 0 0  Altered sleeping 1 0 0 0 0  Tired, decreased energy 1 0 0 0 0  Change in appetite 2 0 0 0 0  Feeling bad or failure about yourself  1 0 0 0 0  Trouble concentrating 0 0 0 0 0  Moving slowly or fidgety/restless 0 0 0 0 0  Suicidal thoughts 0 0 0 0 0  PHQ-9 Score 6 0 0 0 0  Difficult doing work/chores Somewhat difficult Not difficult at all Not difficult at all Not difficult at all Not difficult at all   Hypertension: BP Readings from Last 3 Encounters:  03/10/19 120/70  03/03/19 112/70  02/18/19 101/63   Obesity: Wt Readings from Last 3 Encounters:  03/10/19 236 lb 3.2 oz (107.1 kg)  03/03/19 234 lb (106.1 kg)  02/18/19 210 lb (95.3 kg)   BMI Readings from Last 3 Encounters:  03/10/19 40.54 kg/m  03/03/19 40.17 kg/m  02/18/19 36.05 kg/m     Hep C Screening: Needs testing. STD testing and prevention (HIV/chl/gon/syphilis): HIV negative in 2018; declines additional testing.  Intimate partner violence: No concerns Sexual History/Pain during  Intercourse:  No concerns Menstrual History/LMP/Abnormal Bleeding: No concerns - periods are regular. Incontinence Symptoms: No concerns  Breast cancer:  - Last Mammogram: N/A - BRCA gene screening: No family history.   Osteoporosis: discussed high calcium and vitamin D supplementation and weight bearing exercises.  Cervical cancer screening: Needs referral back to GYN for hx CIN III  Skin cancer: discussed atypical lesions and monitoring for these; she sees dermatology and has not had any issues. Colorectal cancer: Mother passed from [facility] - was diagnosed at 10; will start colonoscopy at 33yo - sooner if changes in BM. No changes in BM's - no blood in stool, dark and tarry stool, mucus in stool, or constipation/diarrhea.  Lung cancer: Former smoker - quit in 2017.  Low Dose CT Chest recommended if Age 4-80 years, 30 pack-year currently smoking OR have quit w/in 15years. Patient does not qualify.   ECG: Denies chest pain, shortness of breath, or palpitations.   Advanced Care Planning: A voluntary discussion about advance care planning including the explanation and discussion of advance directives.  Discussed health care proxy and Living will, and the patient was able to identify a health care proxy as [facility]).  Patient does not have a living will at present  time. If patient does have living will, I have requested they bring this to the clinic to be scanned in to their chart.  Lipids: Lab Results  Component Value Date   CHOL 242 (H) 08/07/2017   CHOL 192 05/26/2016   Lab Results  Component Value Date   HDL 66 08/07/2017   HDL 58 05/26/2016   Lab Results  Component Value Date   LDLCALC 135 (H) 08/07/2017   LDLCALC 102 (H) 05/26/2016   Lab Results  Component Value Date   TRIG 265 (H) 08/07/2017   TRIG 161 (H) 05/26/2016   Lab Results  Component Value Date   CHOLHDL 3.7 08/07/2017   CHOLHDL 3.3 05/26/2016   No results found for: LDLDIRECT  Glucose: Glucose, Bld   Date Value Ref Range Status  08/07/2017 87 65 - 139 mg/dL Final    Comment:    .        Non-fasting reference interval .   11/27/2016 87 65 - 99 mg/dL Final  05/26/2016 84 65 - 99 mg/dL Final    Patient Active Problem List   Diagnosis Date Noted  . Oligomenorrhea 01/08/2018  . Thyroid nodule 08/07/2017  . Obesity (BMI 35.0-39.9 without comorbidity) 07/14/2016  . Depression, recurrent ([place]) 07/11/2016  . Neoplasm of uncertain behavior of skin 05/26/2016  . Verruca vulgaris 10/18/2015  . 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 Anxiety 10/06/2012  . CIN III (cervical intraepithelial neoplasia grade III) with severe dysplasia 08/06/2012    Past Surgical History:  Procedure Laterality Date  . 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 CERVICAL BIOPSY  W/ LOOP ELECTRODE EXCISION  2014   [facility] Dept  . COLPOSCOPY  06/15/2012   [facility], Dr Ardell Isaacs: CIN III    Family History  Problem Relation Age of Onset  . Insomnia Mother   . Colon cancer Mother 11       colon cancer  . Cervical cancer Maternal Aunt        Cervical Cancer  . Colon cancer Paternal Aunt        Colon cancer  . Asthma Brother   . Diabetes Maternal Grandmother   . Diabetes Maternal Grandfather     Social History   Socioeconomic History  . Marital status: Divorced    Spouse name: Freida Busman  . Number of children: 5  . Years of education: Not on file  . Highest education level: Associate degree: occupational, Hotel manager, or vocational program  Occupational History  . Occupation: unemployed  Social Needs  . Financial resource strain: Not very hard  . Food insecurity    Worry: Never true    Inability: Never true  . Transportation needs    Medical: No    Non-medical: No  Tobacco Use  . Smoking status: Former Research scientist (life sciences)  . Smokeless tobacco: Never Used  Substance and Sexual Activity  . Alcohol use: No    Alcohol/week: 0.0 standard  drinks  . Drug use: No  . Sexual activity: Yes    Partners: Male    Birth control/protection: None  Lifestyle  . Physical activity    Days per week: 0 days    Minutes per session: Not on file  . Stress: To some extent  Relationships  . Social connections    Talks on phone: More than three times a week    Gets together: Never    Attends religious service: 1 to 4 times per year  Active member of club or organization: No    Attends meetings of clubs or organizations: Never    Relationship status: Divorced  . Intimate partner violence    Fear of current or ex partner: No    Emotionally abused: No    Physically abused: No    Forced sexual activity: No  Other Topics Concern  . Not on file  Social History Narrative  . Not on file     Current Outpatient Medications:  .  busPIRone (BUSPAR) 15 MG tablet, Take 1 tablet (15 mg total) by mouth 3 (three) times daily., Disp: 270 tablet, Rfl: 3 .  ferrous sulfate 325 (65 FE) MG tablet, Take 325 mg by mouth daily with breakfast., Disp: , Rfl:  .  FLUoxetine (PROZAC) 20 MG capsule, TAKE 3 CAPSULES (60 MG TOTAL) BY MOUTH DAILY. (FOR MOOD STOP ESCITALOPRAM), Disp: 90 capsule, Rfl: 6 .  gabapentin (NEURONTIN) 100 MG capsule, Take 1 tab each night x7 days, then increase to 1 tab twice daily x7 days, then increase to 1 tab three times daily ongoing. (Patient taking differently: Take 100 mg by mouth 3 (three) times daily. ), Disp: 90 capsule, Rfl: 1 .  glycopyrrolate (ROBINUL) 1 MG tablet, Take 2 mg by mouth daily., Disp: , Rfl: 2 .  Multiple Vitamins-Calcium (ONE-A-DAY WOMENS PO), Take 1 tablet by mouth., Disp: , Rfl:  .  fluconazole (DIFLUCAN) 150 MG tablet, Take 1 tablet (150 mg) PO x 1 dose. May repeat 150 mg dose in 3 days if still symptomatic. (Patient not taking: Reported on 03/10/2019), Disp: 2 tablet, Rfl: 0 .  metroNIDAZOLE (FLAGYL) 500 MG tablet, Take 1 tablet (500 mg total) by mouth 2 (two) times daily. (Patient not taking: Reported on  03/10/2019), Disp: 14 tablet, Rfl: 0  No Known Allergies   ROS  Constitutional: Negative for fever or weight change.  Respiratory: Negative for cough and shortness of breath.   Cardiovascular: Negative for chest pain or palpitations.  Gastrointestinal: Negative for abdominal pain, no bowel changes.  Musculoskeletal: Negative for gait problem or joint swelling.  Skin: Negative for rash.  Neurological: Negative for dizziness or headache.  No other specific complaints in a complete review of systems (except as listed in HPI above).  Objective  Vitals:   03/10/19 1325  BP: 120/70  Pulse: 87  Resp: 14  Temp: 97.9 F (36.6 C)  TempSrc: Temporal  SpO2: 96%  Weight: 236 lb 3.2 oz (107.1 kg)  Height: '5\' 4"'$  (1.626 m)    Body mass index is 40.54 kg/m.  Physical Exam  Constitutional: Patient appears well-developed and well-nourished. No distress.  HENT: Head: Normocephalic and atraumatic. Ears: B TMs ok, no erythema or effusion; Nose: Nose normal. Mouth/Throat: Oropharynx is clear and moist. No oropharyngeal exudate.  Eyes: Conjunctivae and EOM are normal. Pupils are equal, round, and reactive to light. No scleral icterus.  Neck: Normal range of motion. Neck supple. No JVD present. No thyromegaly present.  Cardiovascular: Normal rate, regular rhythm and normal heart sounds.  No murmur heard. No BLE edema. Pulmonary/Chest: Effort normal and breath sounds normal. No respiratory distress. Abdominal: Soft. Bowel sounds are normal, no distension. There is no tenderness. no masses Breast: no lumps or masses, no nipple discharge or rashes FEMALE GENITALIA: Deferred Musculoskeletal: Normal range of motion, no joint effusions. No gross deformities Neurological: he is alert and oriented to person, place, and time. No cranial nerve deficit. Coordination, balance, strength, speech and gait are normal.  Skin: Skin is warm  and dry. No rash noted. No erythema.  Psychiatric: Patient has a normal  mood and affect. behavior is normal. Judgment and thought content normal.  Recent Results (from the past 2160 hour(s))  Wet prep, genital     Status: Abnormal   Collection Time: 02/18/19  5:26 PM   Specimen: Vaginal  Result Value Ref Range   Yeast Wet Prep HPF POC PRESENT (A) NONE SEEN   Trich, Wet Prep NONE SEEN NONE SEEN   Clue Cells Wet Prep HPF POC PRESENT (A) NONE SEEN   WBC, Wet Prep HPF POC MODERATE (A) NONE SEEN   Sperm NONE SEEN     Comment: Performed at [facility], [address] [place], [place] [address]  Ferritin     Status: None   Collection Time: 03/03/19  3:14 PM  Result Value Ref Range   Ferritin 39 11 - 307 ng/mL    Comment: Performed at [facility], [address] [place],  [address]   Fall Risk: Fall Risk  03/10/2019 02/07/2019 10/04/2018 05/20/2018 11/17/2017  Falls in the past year? 0 0 0 0 No  Number falls in past yr: 0 0 - - -  Injury with Fall? 0 0 - - -  Follow up Falls evaluation completed Falls evaluation completed - - -   Assessment & Plan  1. Well woman exam (no gynecological exam) -USPSTF grade A and B recommendations reviewed with patient; age-appropriate recommendations, preventive care, screening tests, etc discussed and encouraged; healthy living encouraged; see AVS for patient education given to patient -Discussed importance of 150 minutes of physical activity weekly, eat two servings of fish weekly, eat one serving of tree nuts ( cashews, pistachios, pecans, almonds.Marland Kitchen) every other day, eat 6 servings of fruit/vegetables daily and drink plenty of water and avoid sweet beverages.  - Lipid panel - COMPLETE METABOLIC PANEL WITH GFR - TSH - CBC with Differential/Platelet  2. History of abnormal cervical Pap smear - Ambulatory referral to Gynecology  3. Bilateral leg pain - gabapentin (NEURONTIN) 100 MG capsule; Take 1 tab each night x7 days, then increase to 1 tab twice daily x7 days, then increase to 1 tab three  times daily ongoing.  Dispense: 90 capsule; Refill: 0  4. Obesity (BMI 35.0-39.9 without comorbidity) - gabapentin (NEURONTIN) 100 MG capsule; Take 1 tab each night x7 days, then increase to 1 tab twice daily x7 days, then increase to 1 tab three times daily ongoing.  Dispense: 90 capsule; Refill: 0  5. Goiter, nontoxic, multinodular - TSH  6. CIN III (cervical intraepithelial neoplasia grade III) with severe dysplasia - Ambulatory referral to Gynecology  7. History of anemia - CBC with Differential/Platelet  8. Lipid screening - Lipid panel  9. Diabetes mellitus screening - COMPLETE METABOLIC PANEL WITH GFR  10. Anxiety - hydrOXYzine (VISTARIL) 25 MG capsule; Take 1 capsule (25 mg total) by mouth 3 (three) times daily as needed.  Dispense: 30 capsule; Refill: 0  11. Need for hepatitis C screening test - Hepatitis C antibody

## 2019-03-10 NOTE — Patient Instructions (Signed)
Preventive Care 21-31 Years Old, Female Preventive care refers to visits with your health care provider and lifestyle choices that can promote health and wellness. This includes:  A yearly physical exam. This may also be called an annual well check.  Regular dental visits and eye exams.  Immunizations.  Screening for certain conditions.  Healthy lifestyle choices, such as eating a healthy diet, getting regular exercise, not using drugs or products that contain nicotine and tobacco, and limiting alcohol use. What can I expect for my preventive care visit? Physical exam Your health care provider will check your:  Height and weight. This may be used to calculate body mass index (BMI), which tells if you are at a healthy weight.  Heart rate and blood pressure.  Skin for abnormal spots. Counseling Your health care provider may ask you questions about your:  Alcohol, tobacco, and drug use.  Emotional well-being.  Home and relationship well-being.  Sexual activity.  Eating habits.  Work and work environment.  Method of birth control.  Menstrual cycle.  Pregnancy history. What immunizations do I need?  Influenza (flu) vaccine  This is recommended every year. Tetanus, diphtheria, and pertussis (Tdap) vaccine  You may need a Td booster every 10 years. Varicella (chickenpox) vaccine  You may need this if you have not been vaccinated. Human papillomavirus (HPV) vaccine  If recommended by your health care provider, you may need three doses over 6 months. Measles, mumps, and rubella (MMR) vaccine  You may need at least one dose of MMR. You may also need a second dose. Meningococcal conjugate (MenACWY) vaccine  One dose is recommended if you are age 19-21 years and a first-year college student living in a residence hall, or if you have one of several medical conditions. You may also need additional booster doses. Pneumococcal conjugate (PCV13) vaccine  You may need  this if you have certain conditions and were not previously vaccinated. Pneumococcal polysaccharide (PPSV23) vaccine  You may need one or two doses if you smoke cigarettes or if you have certain conditions. Hepatitis A vaccine  You may need this if you have certain conditions or if you travel or work in places where you may be exposed to hepatitis A. Hepatitis B vaccine  You may need this if you have certain conditions or if you travel or work in places where you may be exposed to hepatitis B. Haemophilus influenzae type b (Hib) vaccine  You may need this if you have certain conditions. You may receive vaccines as individual doses or as more than one vaccine together in one shot (combination vaccines). Talk with your health care provider about the risks and benefits of combination vaccines. What tests do I need?  Blood tests  Lipid and cholesterol levels. These may be checked every 5 years starting at age 20.  Hepatitis C test.  Hepatitis B test. Screening  Diabetes screening. This is done by checking your blood sugar (glucose) after you have not eaten for a while (fasting).  Sexually transmitted disease (STD) testing.  BRCA-related cancer screening. This may be done if you have a family history of breast, ovarian, tubal, or peritoneal cancers.  Pelvic exam and Pap test. This may be done every 3 years starting at age 21. Starting at age 30, this may be done every 5 years if you have a Pap test in combination with an HPV test. Talk with your health care provider about your test results, treatment options, and if necessary, the need for more tests.   Follow these instructions at home: Eating and drinking   Eat a diet that includes fresh fruits and vegetables, whole grains, lean protein, and low-fat dairy.  Take vitamin and mineral supplements as recommended by your health care provider.  Do not drink alcohol if: ? Your health care provider tells you not to drink. ? You are  pregnant, may be pregnant, or are planning to become pregnant.  If you drink alcohol: ? Limit how much you have to 0-1 drink a day. ? Be aware of how much alcohol is in your drink. In the [place] one drink equals one 12 oz bottle of beer (355 mL), one 5 oz glass of wine (148 mL), or one 1 oz glass of hard liquor (44 mL). Lifestyle  Take daily care of your teeth and gums.  Stay active. Exercise for at least 30 minutes on 5 or more days each week.  Do not use any products that contain nicotine or tobacco, such as cigarettes, e-cigarettes, and chewing tobacco. If you need help quitting, ask your health care provider.  If you are sexually active, practice safe sex. Use a condom or other form of birth control (contraception) in order to prevent pregnancy and STIs (sexually transmitted infections). If you plan to become pregnant, see your health care provider for a preconception visit. What's next?  Visit your health care provider once a year for a well check visit.  Ask your health care provider how often you should have your eyes and teeth checked.  Stay up to date on all vaccines. This information is not intended to replace advice given to you by your health care provider. Make sure you discuss any questions you have with your health care provider. Document Released: 06/10/2001 Document Revised: 12/24/2017 Document Reviewed: 12/24/2017 Elsevier Patient Education  2020 [employer].

## 2019-03-11 ENCOUNTER — Telehealth: Payer: Self-pay | Admitting: Obstetrics and Gynecology

## 2019-03-11 LAB — CBC WITH DIFFERENTIAL/PLATELET
Absolute Monocytes: 578 cells/uL (ref 200–950)
Basophils Absolute: 39 cells/uL (ref 0–200)
Basophils Relative: 0.5 %
Eosinophils Absolute: 169 cells/uL (ref 15–500)
Eosinophils Relative: 2.2 %
HCT: 39.9 % (ref 35.0–45.0)
Hemoglobin: 13.4 g/dL (ref 11.7–15.5)
Lymphs Abs: 2888 cells/uL (ref 850–3900)
MCH: 28.3 pg (ref 27.0–33.0)
MCHC: 33.6 g/dL (ref 32.0–36.0)
MCV: 84.2 fL (ref 80.0–100.0)
MPV: 9.4 fL (ref 7.5–12.5)
Monocytes Relative: 7.5 %
Neutro Abs: 4027 cells/uL (ref 1500–7800)
Neutrophils Relative %: 52.3 %
Platelets: 317 10*3/uL (ref 140–400)
RBC: 4.74 10*6/uL (ref 3.80–5.10)
RDW: 12.6 % (ref 11.0–15.0)
Total Lymphocyte: 37.5 %
WBC: 7.7 10*3/uL (ref 3.8–10.8)

## 2019-03-11 LAB — LIPID PANEL
Cholesterol: 261 mg/dL — ABNORMAL HIGH (ref <200)
HDL: 60 mg/dL (ref >=50)
LDL Cholesterol (Calc): 158 mg/dL (calc) — ABNORMAL HIGH
Non-HDL Cholesterol (Calc): 201 mg/dL (calc) — ABNORMAL HIGH (ref <130)
Total CHOL/HDL Ratio: 4.4 (calc) (ref <5.0)
Triglycerides: 260 mg/dL — ABNORMAL HIGH (ref <150)

## 2019-03-11 LAB — COMPLETE METABOLIC PANEL WITH GFR
AG Ratio: 1.6 (calc) (ref 1.0–2.5)
ALT: 19 U/L (ref 6–29)
AST: 15 U/L (ref 10–30)
Albumin: 4.3 g/dL (ref 3.6–5.1)
Alkaline phosphatase (APISO): 73 U/L (ref 31–125)
BUN: 11 mg/dL (ref 7–25)
CO2: 25 mmol/L (ref 20–32)
Calcium: 9.7 mg/dL (ref 8.6–10.2)
Chloride: 103 mmol/L (ref 98–110)
Creat: 0.69 mg/dL (ref 0.50–1.10)
GFR, Est African American: 134 mL/min/{1.73_m2} (ref >=60)
GFR, Est Non African American: 116 mL/min/{1.73_m2} (ref >=60)
Globulin: 2.7 g/dL (calc) (ref 1.9–3.7)
Glucose, Bld: 93 mg/dL (ref 65–99)
Potassium: 4.2 mmol/L (ref 3.5–5.3)
Sodium: 139 mmol/L (ref 135–146)
Total Bilirubin: 0.2 mg/dL (ref 0.2–1.2)
Total Protein: 7 g/dL (ref 6.1–8.1)

## 2019-03-11 LAB — HEPATITIS C ANTIBODY
Hepatitis C Ab: NONREACTIVE
SIGNAL TO CUT-OFF: 0.14 (ref <1.00)

## 2019-03-11 LAB — TSH: TSH: 1.49 mIU/L

## 2019-03-11 NOTE — Telephone Encounter (Signed)
[facility] referring to Dr Glennon Mac for  Hx abnormal pap; continuation of treatment/eval/ repeat pap smear. Called and left voicemail for patient to call back to be schedule

## 2019-03-15 ENCOUNTER — Telehealth: Payer: Self-pay

## 2019-03-15 ENCOUNTER — Other Ambulatory Visit: Payer: Self-pay

## 2019-03-15 DIAGNOSIS — Z800 Family history of malignant neoplasm of digestive organs: Secondary | ICD-10-CM

## 2019-03-15 DIAGNOSIS — Z1211 Encounter for screening for malignant neoplasm of colon: Secondary | ICD-10-CM

## 2019-03-15 NOTE — Telephone Encounter (Signed)
Gastroenterology Pre-Procedure Review  Request Date: Tuesday 04/05/19 Requesting Physician: Dr. Allen Norris  PATIENT REVIEW QUESTIONS: The patient responded to the following health history questions as indicated:    1. Are you having any GI issues? no 2. Do you have a personal history of Polyps? no 3. Do you have a family history of Colon Cancer or Polyps? yes (Mother colon cancer) 4. Diabetes Mellitus? no 5. Joint replacements in the past 12 months?no 6. Major health problems in the past 3 months?no 7. Any artificial heart valves, MVP, or defibrillator?no    MEDICATIONS & ALLERGIES:    Patient reports the following regarding taking any anticoagulation/antiplatelet therapy:   Plavix, Coumadin, Eliquis, Xarelto, Lovenox, Pradaxa, Brilinta, or Effient? no Aspirin? no  Patient confirms/reports the following medications:  Current Outpatient Medications  Medication Sig Dispense Refill  . busPIRone (BUSPAR) 15 MG tablet Take 1 tablet (15 mg total) by mouth 3 (three) times daily. 270 tablet 3  . ferrous sulfate 325 (65 FE) MG tablet Take 325 mg by mouth daily with breakfast.    . FLUoxetine (PROZAC) 20 MG capsule TAKE 3 CAPSULES (60 MG TOTAL) BY MOUTH DAILY. (FOR MOOD STOP ESCITALOPRAM) 90 capsule 6  . gabapentin (NEURONTIN) 100 MG capsule Take 1 tab each night x7 days, then increase to 1 tab twice daily x7 days, then increase to 1 tab three times daily ongoing. 90 capsule 0  . glycopyrrolate (ROBINUL) 1 MG tablet Take 2 mg by mouth daily.  2  . hydrOXYzine (VISTARIL) 25 MG capsule Take 1 capsule (25 mg total) by mouth 3 (three) times daily as needed. 30 capsule 0  . Multiple Vitamins-Calcium (ONE-A-DAY WOMENS PO) Take 1 tablet by mouth.     No current facility-administered medications for this visit.     Patient confirms/reports the following allergies:  No Known Allergies  No orders of the defined types were placed in this encounter.   AUTHORIZATION INFORMATION Primary  Insurance: 1D#: Group #:  Secondary Insurance: 1D#: Group #:  SCHEDULE INFORMATION: Date: Tuesday 04/05/19 Time: Location:[facility]

## 2019-03-15 NOTE — Telephone Encounter (Signed)
Copied from [place] [id number]. Topic: Referral - Status >> Mar 15, 2019  8:11 AM Lennox Solders wrote: Reason for CRM: penny with [facility] does not accept medicaid   Patient wants to go to [facility]. Referral has been sent as requested.

## 2019-03-23 ENCOUNTER — Encounter: Payer: Self-pay | Admitting: Family Medicine

## 2019-03-23 DIAGNOSIS — E782 Mixed hyperlipidemia: Secondary | ICD-10-CM

## 2019-03-23 DIAGNOSIS — Z8249 Family history of ischemic heart disease and other diseases of the circulatory system: Secondary | ICD-10-CM

## 2019-03-23 MED ORDER — ATORVASTATIN CALCIUM 10 MG PO TABS: 10.0000 mg | ORAL_TABLET | Freq: Every day | ORAL | 3 refills | Status: DC

## 2019-03-31 ENCOUNTER — Encounter: Payer: Self-pay | Admitting: Family Medicine

## 2019-04-01 ENCOUNTER — Other Ambulatory Visit: Payer: Self-pay

## 2019-04-01 ENCOUNTER — Other Ambulatory Visit
Admission: RE | Admit: 2019-04-01 | Discharge: 2019-04-01 | Disposition: A | Discharge status: Home / Self Care | Payer: Medicaid Other | Source: Ambulatory Visit | Attending: Gastroenterology | Admitting: Gastroenterology

## 2019-04-01 DIAGNOSIS — Z01812 Encounter for preprocedural laboratory examination: Secondary | ICD-10-CM | POA: Diagnosis not present

## 2019-04-01 DIAGNOSIS — Z20828 Contact with and (suspected) exposure to other viral communicable diseases: Secondary | ICD-10-CM | POA: Diagnosis not present

## 2019-04-01 LAB — SARS CORONAVIRUS 2 (TAT 6-24 HRS): SARS Coronavirus 2: NEGATIVE

## 2019-04-04 ENCOUNTER — Encounter: Payer: Self-pay | Admitting: Certified Registered Nurse Anesthetist

## 2019-04-05 ENCOUNTER — Encounter: Payer: Self-pay | Admitting: Family Medicine

## 2019-04-05 ENCOUNTER — Ambulatory Visit
Admission: RE | Admit: 2019-04-05 | Discharge: 2019-04-05 | Disposition: A | Discharge status: Home / Self Care | Payer: Medicaid Other | Attending: Gastroenterology | Admitting: Gastroenterology

## 2019-04-05 ENCOUNTER — Encounter
Admission: RE | Disposition: A | Discharge status: Home / Self Care | Payer: Self-pay | Source: Home / Self Care | Attending: Gastroenterology

## 2019-04-05 ENCOUNTER — Other Ambulatory Visit: Payer: Self-pay

## 2019-04-05 DIAGNOSIS — Z539 Procedure and treatment not carried out, unspecified reason: Secondary | ICD-10-CM | POA: Insufficient documentation

## 2019-04-05 DIAGNOSIS — Z800 Family history of malignant neoplasm of digestive organs: Secondary | ICD-10-CM | POA: Diagnosis not present

## 2019-04-05 DIAGNOSIS — Z1211 Encounter for screening for malignant neoplasm of colon: Secondary | ICD-10-CM | POA: Diagnosis not present

## 2019-04-05 DIAGNOSIS — Z3201 Encounter for pregnancy test, result positive: Secondary | ICD-10-CM

## 2019-04-05 SURGERY — COLONOSCOPY WITH PROPOFOL
Anesthesia: General

## 2019-04-05 MED ORDER — SODIUM CHLORIDE 0.9 % IV SOLN: INTRAVENOUS | Status: DC

## 2019-04-07 ENCOUNTER — Other Ambulatory Visit (HOSPITAL_COMMUNITY)
Admission: RE | Admit: 2019-04-07 | Discharge: 2019-04-07 | Disposition: A | Discharge status: Home / Self Care | Payer: Medicaid Other | Source: Ambulatory Visit | Attending: Obstetrics and Gynecology | Admitting: Obstetrics and Gynecology

## 2019-04-07 ENCOUNTER — Encounter: Payer: Self-pay | Admitting: Obstetrics and Gynecology

## 2019-04-07 ENCOUNTER — Ambulatory Visit (INDEPENDENT_AMBULATORY_CARE_PROVIDER_SITE_OTHER): Payer: Medicaid Other | Admitting: Obstetrics and Gynecology

## 2019-04-07 ENCOUNTER — Other Ambulatory Visit: Payer: Self-pay

## 2019-04-07 VITALS — BP 110/64 | HR 80 | Ht 64.0 in | Wt 238.0 lb

## 2019-04-07 DIAGNOSIS — Z331 Pregnant state, incidental: Secondary | ICD-10-CM

## 2019-04-07 DIAGNOSIS — R87612 Low grade squamous intraepithelial lesion on cytologic smear of cervix (LGSIL): Secondary | ICD-10-CM | POA: Insufficient documentation

## 2019-04-07 NOTE — Progress Notes (Signed)
Obstetrics & Gynecology Office Visit   Chief Complaint  Patient presents with  . Abnormal Pap Smear    referred by Raelyn Ensign, abnormal pap, first trimester pregnancy  The patient is seen in referral at the request of Hubbard Hartshorn, FNP from [facility] for history of CIN III, LGSIL pap smear with HPV positive last yera.  History of Present Illness: 33 y.o. [id number] female who presents in request of Raelyn Ensign, FNP, from [facility] for a history of CIN III and LGSIL pap smear with HPV positive last year. Pap smear history: 2011-2012: unconfirmed LEEP procedure (see Past Medical History below). I can find no record of this.This appears to be per patient report. 04/06/2012:ASCUS-HPV + 06/15/12: Colposcopy - CIN 2-3 (LEEP recommended. However, patient became pregnant prior to procedure being performed).  11/09/2012- LGSIL (pregnant at time) 06/06/2013- There is a note (I believe it is from [facility]) that states that she had a colposcopy on the date listed with the result of CIN 1 on biopsy. I can not find any record of this colposcopy procedure taking place. 10/19/2013: NILM, no HPV tested. The clinic note states explicitly that at this noted date the patient was 8 months postpartum and had no follow-up from her CIN 2-3 from colposcopy on 06/05/12.  07/17/2014- NILM, no HPV tested (initial prenatal visit)  03/29/2015- NILM, no HPV tested(performed at [facility] postpartum). 05/26/2016- ASCUS, HPV negative - insufficient endocervical component. Patient sent to [facility] for colposcopy/management.  07/11/2016 - NILM, HPV negative(performed by me at referral appointment from Dr. Sanda Klein). Recommended follow up pap smear in one year. Patient deferred colposcopy given that she may not need colposcopy, if pap normal).  08/19/2017 - LGSIL, HPV + 09/16/2017 - Colposcopy - all biopsies negative  Of note, she states that she had a recent  positive pregnancy test.     Past Medical History:  Diagnosis Date  . Anxiety   . CIN III (cervical intraepithelial neoplasia III)    2011 or 2012-LEEP  . Depression   . High risk HPV infection    followed by GYN  . Multinodular goiter     Past Surgical History:  Procedure Laterality Date  . 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 CERVICAL BIOPSY  W/ LOOP ELECTRODE EXCISION  2014   [facility] Dept  . COLPOSCOPY  06/15/2012   [facility], Dr Ardell Isaacs: CIN III    Gynecologic History: Patient's last menstrual period was 03/02/2019 (approximate).  Obstetric History: [id number]  Family History  Problem Relation Age of Onset  . Insomnia Mother   . Colon cancer Mother 50       colon cancer  . Cervical cancer Maternal Aunt        Cervical Cancer  . Colon cancer Paternal Aunt        Colon cancer  . Asthma Brother   . Diabetes Maternal Grandmother   . Diabetes Maternal Grandfather     Social History   Socioeconomic History  . Marital status: Divorced    Spouse name: Freida Busman  . Number of children: 5  . Years of education: Not on file  . Highest education level: Associate degree: occupational, Hotel manager, or vocational program  Occupational History  . Occupation: unemployed  Tobacco Use  . Smoking status: Former Smoker    Packs/day: 0.50    Years: 10.00    Pack years: 5.00    Quit date: 06/27/2015    Years since quitting:  3.7  . Smokeless tobacco: Never Used  Substance and Sexual Activity  . Alcohol use: No    Alcohol/week: 0.0 standard drinks  . Drug use: No  . Sexual activity: Yes    Partners: Male    Birth control/protection: None  Other Topics Concern  . Not on file  Social History Narrative  . Not on file   Social Determinants of Health   Financial Resource Strain: Low Risk   . Difficulty of Paying Living Expenses: Not very hard  Food Insecurity: No Food Insecurity  . Worried About Charity fundraiser in the Last Year: Never  true  . Ran Out of Food in the Last Year: Never true  Transportation Needs: No Transportation Needs  . Lack of Transportation (Medical): No  . Lack of Transportation (Non-Medical): No  Physical Activity: Unknown  . Days of Exercise per Week: 0 days  . Minutes of Exercise per Session: Not asked  Stress: Stress Concern Present  . Feeling of Stress : To some extent  Social Connections: Somewhat Isolated  . Frequency of Communication with Friends and Family: More than three times a week  . Frequency of Social Gatherings with Friends and Family: Never  . Attends Religious Services: 1 to 4 times per year  . Active Member of Clubs or Organizations: No  . Attends Archivist Meetings: Never  . Marital Status: Divorced  Human resources officer Violence: Not At Risk  . Fear of Current or Ex-Partner: No  . Emotionally Abused: No  . Physically Abused: No  . Sexually Abused: No    No Known Allergies  Prior to Admission medications   Medication Sig Start Date End Date Taking? Authorizing Provider  busPIRone (BUSPAR) 15 MG tablet Take 1 tablet (15 mg total) by mouth 3 (three) times daily. 10/04/18  Yes Poulose, Bethel Born, NP  ferrous sulfate 325 (65 FE) MG tablet Take 325 mg by mouth daily with breakfast.   Yes [provider]  FLUoxetine (PROZAC) 20 MG capsule TAKE 3 CAPSULES (60 MG TOTAL) BY MOUTH DAILY. (FOR MOOD STOP ESCITALOPRAM) 01/05/19  Yes Hubbard Hartshorn, FNP  gabapentin (NEURONTIN) 100 MG capsule Take 1 tab each night x7 days, then increase to 1 tab twice daily x7 days, then increase to 1 tab three times daily ongoing. 03/10/19  Yes Hubbard Hartshorn, FNP  hydrOXYzine (VISTARIL) 25 MG capsule Take 1 capsule (25 mg total) by mouth 3 (three) times daily as needed. 03/10/19  Yes Hubbard Hartshorn, FNP  Multiple Vitamins-Calcium (ONE-A-DAY WOMENS PO) Take 1 tablet by mouth.   Yes [provider]  atorvastatin (LIPITOR) 10 MG tablet Take 1 tablet (10 mg total) by mouth  daily. Patient not taking: Reported on 04/07/2019 03/23/19   Hubbard Hartshorn, FNP  glycopyrrolate (ROBINUL) 1 MG tablet Take 2 mg by mouth daily. 10/25/17   [provider]    Review of Systems  Constitutional: Negative.   HENT: Negative.   Eyes: Negative.   Respiratory: Negative.   Cardiovascular: Negative.   Gastrointestinal: Positive for nausea and vomiting. Negative for abdominal pain, blood in stool, constipation, diarrhea, heartburn and melena.  Genitourinary: Negative.   Musculoskeletal: Negative.   Skin: Negative.   Neurological: Negative.   Psychiatric/Behavioral: Positive for depression. The patient is nervous/anxious.      Physical Exam BP 110/64 (BP Location: Left Arm, Patient Position: Sitting, Cuff Size: Large)   Pulse 80   Ht 5\' 4"  (1.626 m)   Wt 238 lb (108  kg)   LMP 03/02/2019 (Approximate)   BMI 40.85 kg/m  Patient's last menstrual period was 03/02/2019 (approximate). Physical Exam Constitutional:      General: She is not in acute distress.    Appearance: Normal appearance.  Genitourinary:     Pelvic exam was performed with patient in the lithotomy position.     Vulva, inguinal canal, urethra, vagina, cervix and uterus normal.  HENT:     Head: Normocephalic and atraumatic.  Eyes:     General: No scleral icterus.    Conjunctiva/sclera: Conjunctivae normal.  Neurological:     General: No focal deficit present.     Mental Status: She is alert and oriented to person, place, and time.     Cranial Nerves: No cranial nerve deficit.  Psychiatric:        Mood and Affect: Mood normal.        Behavior: Behavior normal.        Judgment: Judgment normal.    Female chaperone present for pelvic and breast  portions of the physical exam  Assessment: 33 y.o. [id number] female here for  1. LGSIL on Pap smear of cervix   2. Incidental pregnancy confirmed      Plan: Problem List Items Addressed This Visit    None    Visit Diagnoses    LGSIL on Pap smear  of cervix    -  Primary   Relevant Orders   Cytology - PAP   Incidental pregnancy confirmed       Relevant Orders   US OB Comp Less 14 Wks     Pap smear with STD screen today (for new OB patient) and for history of LGSIL/HPV+ pap smear last year and history of CIN III.   Discussed safe medications in pregnancy.  Return in about 2 weeks (around 04/21/2019) for U/S for dating/viability and NOB after (can be 2 or 3 weeks).   Prentice Docker, MD 04/07/2019 4:13 PM     Hubbard Hartshorn, [address],  [place] [id number]

## 2019-04-10 ENCOUNTER — Other Ambulatory Visit: Payer: Self-pay | Admitting: Family Medicine

## 2019-04-10 DIAGNOSIS — F419 Anxiety disorder, unspecified: Secondary | ICD-10-CM

## 2019-04-10 NOTE — Telephone Encounter (Signed)
Please have patient follow up with her OB about the hydroxyzine as she is newly pregnant.  I am not comfortable refilling at this time, however, Dr. Glennon Mac may be more comfortable. Thanks!

## 2019-04-11 LAB — CYTOLOGY - PAP
Adequacy: ABSENT
Chlamydia: NEGATIVE
Comment: NEGATIVE
Comment: NEGATIVE
Comment: NORMAL
Diagnosis: NEGATIVE
High risk HPV: NEGATIVE
Neisseria Gonorrhea: NEGATIVE

## 2019-04-27 ENCOUNTER — Ambulatory Visit (INDEPENDENT_AMBULATORY_CARE_PROVIDER_SITE_OTHER): Payer: Medicaid Other

## 2019-04-27 ENCOUNTER — Other Ambulatory Visit: Payer: Self-pay

## 2019-04-27 ENCOUNTER — Other Ambulatory Visit (HOSPITAL_COMMUNITY)
Admission: RE | Admit: 2019-04-27 | Discharge: 2019-04-27 | Disposition: A | Discharge status: Home / Self Care | Payer: Medicaid Other | Source: Ambulatory Visit | Attending: Advanced Practice Midwife | Admitting: Advanced Practice Midwife

## 2019-04-27 ENCOUNTER — Ambulatory Visit (INDEPENDENT_AMBULATORY_CARE_PROVIDER_SITE_OTHER): Payer: Medicaid Other | Admitting: Advanced Practice Midwife

## 2019-04-27 ENCOUNTER — Encounter: Payer: Self-pay | Admitting: Advanced Practice Midwife

## 2019-04-27 VITALS — BP 120/80 | Wt 236.0 lb

## 2019-04-27 DIAGNOSIS — O3481 Maternal care for other abnormalities of pelvic organs, first trimester: Secondary | ICD-10-CM | POA: Diagnosis not present

## 2019-04-27 DIAGNOSIS — Z6841 Body Mass Index (BMI) 40.0 and over, adult: Secondary | ICD-10-CM

## 2019-04-27 DIAGNOSIS — F419 Anxiety disorder, unspecified: Secondary | ICD-10-CM

## 2019-04-27 DIAGNOSIS — Z3A08 8 weeks gestation of pregnancy: Secondary | ICD-10-CM

## 2019-04-27 DIAGNOSIS — N8311 Corpus luteum cyst of right ovary: Secondary | ICD-10-CM

## 2019-04-27 DIAGNOSIS — O99341 Other mental disorders complicating pregnancy, first trimester: Secondary | ICD-10-CM | POA: Diagnosis not present

## 2019-04-27 DIAGNOSIS — O99211 Obesity complicating pregnancy, first trimester: Secondary | ICD-10-CM

## 2019-04-27 DIAGNOSIS — Z331 Pregnant state, incidental: Secondary | ICD-10-CM

## 2019-04-27 DIAGNOSIS — Z3A01 Less than 8 weeks gestation of pregnancy: Secondary | ICD-10-CM | POA: Diagnosis not present

## 2019-04-27 DIAGNOSIS — O99210 Obesity complicating pregnancy, unspecified trimester: Secondary | ICD-10-CM

## 2019-04-27 DIAGNOSIS — O0990 Supervision of high risk pregnancy, unspecified, unspecified trimester: Secondary | ICD-10-CM | POA: Insufficient documentation

## 2019-04-27 DIAGNOSIS — Z113 Encounter for screening for infections with a predominantly sexual mode of transmission: Secondary | ICD-10-CM | POA: Diagnosis not present

## 2019-04-27 MED ORDER — HYDROXYZINE HCL 25 MG PO TABS: 25.0000 mg | ORAL_TABLET | Freq: Two times a day (BID) | ORAL | 2 refills | Status: DC | PRN

## 2019-04-27 NOTE — Patient Instructions (Signed)
Perinatal Anxiety When a woman feels excessive tension or worry (anxiety) during pregnancy or during the first 12 months after she gives birth, she has a condition called perinatal anxiety. Anxiety can interfere with work, school, relationships, and other everyday activities. If it is not managed properly, it can also cause problems in the mother and her baby.  If you are pregnant and you have symptoms of an anxiety disorder, it is important to talk with your health care provider. What are the causes? The exact cause of this condition is not known. Hormonal changes during and after pregnancy may play a role in causing perinatal anxiety. What increases the risk? You are more likely to develop this condition if:  You have a personal or family history of depression, anxiety, or mood disorders.  You experience a stressful life event during pregnancy, such as the death of a loved one.  You have a lot of regular life stress, such as being a single parent.  You have thyroid problems. What are the signs or symptoms? Perinatal anxiety can be different for everyone. It may include:  Panic attacks (panic disorder). These are intense episodes of fear or discomfort that may also cause sweating, nausea, shortness of breath, or fear of dying. They usually last 5-15 minutes.  Reliving an upsetting (traumatic) event through distressing thoughts, dreams, or flashbacks (post-traumatic stress disorder, or PTSD).  Excessive worry about multiple problems (generalized anxiety disorder).  Fear and stress about leaving certain people or loved ones (separation anxiety).  Performing repetitive tasks (compulsions) to relieve stress or worry (obsessive compulsive disorder, or OCD).  Fear of certain objects or situations (phobias).  Excessive worrying, such as a constant feeling that something bad is going to happen.  Inability to relax.  Difficulty concentrating.  Sleep problems.  Frequent nightmares or  disturbing thoughts. How is this diagnosed? This condition is diagnosed based on a physical exam and mental evaluation. In some cases, your health care provider may use an anxiety screening tool. These tools include a list of questions that can help a health care provider diagnose anxiety. Your health care provider may refer you to a mental health expert who specializes in anxiety. How is this treated? This condition may be treated with:  Medicines. Your health care provider will only give you medicines that have been proven safe for pregnancy and breastfeeding.  Talk therapy with a mental health professional to help change your patterns of thinking (cognitive behavioral therapy).  Mindfulness-based stress reduction.  Other relaxation therapies, such as deep breathing or guided muscle relaxation.  Support groups. Follow these instructions at home: Lifestyle  Do not use any products that contain nicotine or tobacco, such as cigarettes and e-cigarettes. If you need help quitting, ask your health care provider.  Do not use alcohol when you are pregnant. After your baby is born, limit alcohol intake to no more than 1 drink a day. One drink equals 12 oz of beer, 5 oz of wine, or 1 oz of hard liquor.  Consider joining a support group for new mothers. Ask your health care provider for recommendations.  Take good care of yourself. Make sure you: ? Get plenty of sleep. If you are having trouble sleeping, talk with your health care provider. ? Eat a healthy diet. This includes plenty of fruits and vegetables, whole grains, and lean proteins. ? Exercise regularly, as told by your health care provider. Ask your health care provider what exercises are safe for you. General instructions  Take over-the-counter  and prescription medicines only as told by your health care provider.  Talk with your partner or family members about your feelings during pregnancy. Share any concerns or fears that you may  have.  Ask for help with tasks or chores when you need it. Ask friends and family members to provide meals, watch your children, or help with cleaning.  Keep all follow-up visits as told by your health care provider. This is important. Contact a health care provider if:  You (or people close to you) notice that you have any symptoms of anxiety or depression.  You have anxiety and your symptoms get worse.  You experience side effects from medicines, such as nausea or sleep problems. Get help right away if:  You feel like hurting yourself, your baby, or someone else. If you ever feel like you may hurt yourself or others, or have thoughts about taking your own life, get help right away. You can go to your nearest emergency department or call:  Your local emergency services (911 in the [place]  A suicide crisis helpline, such as the [place] at (618)352-0032. This is open 24 hours a day. Summary  Perinatal anxiety is when a woman feels excessive tension or worry during pregnancy or during the first 12 months after she gives birth.  Perinatal anxiety may include panic attacks, post-traumatic stress disorder, separation anxiety, phobias, or generalized anxiety.  Perinatal anxiety can cause physical health problems in the mother and baby if not properly managed.  This condition is treated with medicines, talk therapy, stress reduction therapies, or a combination of two or more treatments.  Talk with your partner or family members about your concerns or fears. Do not be afraid to ask for help. This information is not intended to replace advice given to you by your health care provider. Make sure you discuss any questions you have with your health care provider. Document Released: 06/11/2016 Document Revised: 04/17/2017 Document Reviewed: 06/11/2016 Elsevier Patient Education  [place]. Exercise During Pregnancy Exercise is an important part of being  healthy for people of all ages. Exercise improves the function of your heart and lungs and helps you maintain strength, flexibility, and a healthy body weight. Exercise also boosts energy levels and elevates mood. Most women should exercise regularly during pregnancy. In rare cases, women with certain medical conditions or complications may be asked to limit or avoid exercise during pregnancy. How does this affect me? Along with maintaining general strength and flexibility, exercising during pregnancy can help:  Keep strength in muscles that are used during labor and childbirth.  Decrease low back pain.  Reduce symptoms of depression.  Control weight gain during pregnancy.  Reduce the risk of needing insulin if you develop diabetes during pregnancy.  Decrease the risk of cesarean delivery.  Speed up your recovery after giving birth. How does this affect my baby? Exercise can help you have a healthy pregnancy. Exercise does not cause premature birth. It will not cause your baby to weigh less at birth. What exercises can I do? Many exercises are safe for you to do during pregnancy. Do a variety of exercises that safely increase your heart and breathing rates and help you build and maintain muscle strength. Do exercises exactly as told by your health care provider. You may do these exercises:  Walking or hiking.  Swimming.  Water aerobics.  Riding a stationary bike.  Strength training.  Modified yoga or Pilates. Tell your instructor that you are pregnant.  Avoid overstretching, and avoid lying on your back for long periods of time.  Running or jogging. Only choose this type of exercise if you: ? Ran or jogged regularly before your pregnancy. ? Can run or jog and still talk in complete sentences. What exercises should I avoid? Depending on your level of fitness and whether you exercised regularly before your pregnancy, you may be told to limit high-intensity exercise. You can tell  that you are exercising at a high intensity if you are breathing much harder and faster and cannot hold a conversation while exercising. You must avoid:  Contact sports.  Activities that put you at risk for falling on or being hit in the belly, such as downhill skiing, water skiing, surfing, rock climbing, cycling, gymnastics, and horseback riding.  Scuba diving.  Skydiving.  Yoga or Pilates in a room that is heated to high temperatures.  Jogging or running, unless you ran or jogged regularly before your pregnancy. While jogging or running, you should always be able to talk in full sentences. Do not run or jog so fast that you are unable to have a conversation.  Do not exercise at more than 6,000 feet above sea level (high elevation) if you are not used to exercising at high elevation. How do I exercise in a safe way?   Avoid overheating. Do not exercise in very high temperatures.  Wear loose-fitting, breathable clothes.  Avoid dehydration. Drink enough water before, during, and after exercise to keep your urine pale yellow.  Avoid overstretching. Because of hormone changes during pregnancy, it is easy to overstretch muscles, tendons, and ligaments during pregnancy.  Start slowly and ask your health care provider to recommend the types of exercise that are safe for you.  Do not exercise to lose weight. Follow these instructions at home:  Exercise on most days or all days of the week. Try to exercise for 30 minutes a day, 5 days a week, unless your health care provider tells you not to.  If you actively exercised before your pregnancy and you are healthy, your health care provider may tell you to continue to do moderate to high-intensity exercise.  If you are just starting to exercise or did not exercise much before your pregnancy, your health care provider may tell you to do low to moderate-intensity exercise. Questions to ask your health care provider  Is exercise safe for  me?  What are signs that I should stop exercising?  Does my health condition mean that I should not exercise during pregnancy?  When should I avoid exercising during pregnancy? Stop exercising and contact a health care provider if: You have any unusual symptoms, such as:  Mild contractions of the uterus or cramps in the abdomen.  Dizziness that does not go away when you rest. Stop exercising and get help right away if: You have any unusual symptoms, such as:  Sudden, severe pain in your low back or your belly.  Mild contractions of the uterus or cramps in the abdomen that do not improve with rest and drinking fluids.  Chest pain.  Bleeding or fluid leaking from your vagina.  Shortness of breath. These symptoms may represent a serious problem that is an emergency. Do not wait to see if the symptoms will go away. Get medical help right away. Call your local emergency services (911 in the [place] Do not drive yourself to the hospital. Summary  Most women should exercise regularly throughout pregnancy. In rare cases, women with certain medical  conditions or complications may be asked to limit or avoid exercise during pregnancy.  Do not exercise to lose weight during pregnancy.  Your health care provider will tell you what level of physical activity is right for you.  Stop exercising and contact a health care provider if you have mild contractions of the uterus or cramps in the abdomen. Get help right away if these contractions or cramps do not improve with rest and drinking fluids.  Stop exercising and get help right away if you have sudden, severe pain in your low back or belly, chest pain, shortness of breath, or bleeding or leaking of fluid from your vagina. This information is not intended to replace advice given to you by your health care provider. Make sure you discuss any questions you have with your health care provider. Document Released: 04/14/2005 Document Revised:  08/05/2018 Document Reviewed: 05/19/2018 Elsevier Patient Education  2020 [place] for Pregnant Women While you are pregnant, your body requires additional nutrition to help support your growing baby. You also have a higher need for some vitamins and minerals, such as folic acid, calcium, iron, and vitamin D. Eating a healthy, well-balanced diet is very important for your health and your baby's health. Your need for extra calories varies for the three 8-month segments of your pregnancy (trimesters). For most women, it is recommended to consume:  150 extra calories a day during the first trimester.  300 extra calories a day during the second trimester.  300 extra calories a day during the third trimester. What are tips for following this plan?   Do not try to lose weight or go on a diet during pregnancy.  Limit your overall intake of foods that have "empty calories." These are foods that have little nutritional value, such as sweets, desserts, candies, and sugar-sweetened beverages.  Eat a variety of foods (especially fruits and vegetables) to get a full range of vitamins and minerals.  Take a prenatal vitamin to help meet your additional vitamin and mineral needs during pregnancy, specifically for folic acid, iron, calcium, and vitamin D.  Remember to stay active. Ask your health care provider what types of exercise and activities are safe for you.  Practice good food safety and cleanliness. Wash your hands before you eat and after you prepare raw meat. Wash all fruits and vegetables well before peeling or eating. Taking these actions can help to prevent food-borne illnesses that can be very dangerous to your baby, such as listeriosis. Ask your health care provider for more information about listeriosis. What does 150 extra calories look like? Healthy options that provide 150 extra calories each day could be any of the following:  6-8 oz (170-230 g) of plain low-fat  yogurt with  cup of berries.  1 apple with 2 teaspoons (11 g) of peanut butter.  Cut-up vegetables with  cup (60 g) of hummus.  8 oz (230 mL) or 1 cup of low-fat chocolate milk.  1 stick of string cheese with 1 medium orange.  1 peanut butter and jelly sandwich that is made with one slice of whole-wheat bread and 1 tsp (5 g) of peanut butter. For 300 extra calories, you could eat two of those healthy options each day. What is a healthy amount of weight to gain? The right amount of weight gain for you is based on your BMI before you became pregnant. If your BMI:  Was less than 18 (underweight), you should gain 28-40 lb (13-18 kg).  Was  18-24.9 (normal), you should gain 25-35 lb (11-16 kg).  Was 25-29.9 (overweight), you should gain 15-25 lb (7-11 kg).  Was 30 or greater (obese), you should gain 11-20 lb (5-9 kg). What if I am having twins or multiples? Generally, if you are carrying twins or multiples:  You may need to eat 300-600 extra calories a day.  The recommended range for total weight gain is 25-54 lb (11-25 kg), depending on your BMI before pregnancy.  Talk with your health care provider to find out about nutritional needs, weight gain, and exercise that is right for you. What foods can I eat?  Grains All grains. Choose whole grains, such as whole-wheat bread, oatmeal, or brown rice. Vegetables All vegetables. Eat a variety of colors and types of vegetables. Remember to wash your vegetables well before peeling or eating. Fruits All fruits. Eat a variety of colors and types of fruit. Remember to wash your fruits well before peeling or eating. Meats and other protein foods Lean meats, including chicken, [place], fish, and lean cuts of beef, veal, or pork. If you eat fish or seafood, choose options that are higher in omega-3 fatty acids and lower in mercury, such as salmon, herring, mussels, trout, sardines, pollock, shrimp, crab, and lobster. Tofu. Tempeh. Beans. Eggs.  Peanut butter and other nut butters. Make sure that all meats, poultry, and eggs are cooked to food-safe temperatures or "well-done." Two or more servings of fish are recommended each week in order to get the most benefits from omega-3 fatty acids that are found in seafood. Choose fish that are lower in mercury. You can find more information online:  [employer] Dairy Pasteurized milk and milk alternatives (such as almond milk). Pasteurized yogurt and pasteurized cheese. Cottage cheese. Sour cream. Beverages Water. Juices that contain 100% fruit juice or vegetable juice. Caffeine-free teas and decaffeinated coffee. Drinks that contain caffeine are okay to drink, but it is better to avoid caffeine. Keep your total caffeine intake to less than 200 mg each day (which is 12 oz or 355 mL of coffee, tea, or soda) or the limit as told by your health care provider. Fats and oils Fats and oils are okay to include in moderation. Sweets and desserts Sweets and desserts are okay to include in moderation. Seasoning and other foods All pasteurized condiments. The items listed above may not be a complete list of recommended foods and beverages. Contact your dietitian for more options. The items listed above may not be a complete list of foods and beverages [you/your child] can eat. Contact a dietitian for more information. What foods are not recommended? Vegetables Raw (unpasteurized) vegetable juices. Fruits Unpasteurized fruit juices. Meats and other protein foods Lunch meats, bologna, hot dogs, or other deli meats. (If you must eat those meats, reheat them until they are steaming hot.) Refrigerated pat, meat spreads from a meat counter, smoked seafood that is found in the refrigerated section of a store. Raw or undercooked meats, poultry, and eggs. Raw fish, such as sushi or sashimi. Fish that have high mercury content, such as tilefish, shark, swordfish, and king mackerel. To learn more about mercury  in fish, talk with your health care provider or look for online resources, such as:  [employer] Dairy Raw (unpasteurized) milk and any foods that have raw milk in them. Soft cheeses, such as feta, queso blanco, queso fresco, Brie, Camembert cheeses, blue-veined cheeses, and Panela cheese (unless it is made with pasteurized milk, which must be stated on the label). Beverages Alcohol.  Sugar-sweetened beverages, such as sodas, teas, or energy drinks. Seasoning and other foods Homemade fermented foods and drinks, such as pickles, sauerkraut, or kombucha drinks. (Store-bought pasteurized versions of these are okay.) Salads that are made in a store or deli, such as ham salad, chicken salad, egg salad, tuna salad, and seafood salad. The items listed above may not be a complete list of foods and beverages to avoid. Contact your dietitian for more information. The items listed above may not be a complete list of foods and beverages [you/your child] should avoid. Contact a dietitian for more information. Where to find more information To calculate the number of calories you need based on your height, weight, and activity level, you can use an online calculator such as:  MobileTransition.ch To calculate how much weight you should gain during pregnancy, you can use an online pregnancy weight gain calculator such as:  Streaming[employer].com.cy Summary  While you are pregnant, your body requires additional nutrition to help support your growing baby.  Eat a variety of foods, especially fruits and vegetables to get a full range of vitamins and minerals.  Practice good food safety and cleanliness. Wash your hands before you eat and after you prepare raw meat. Wash all fruits and vegetables well before peeling or eating. Taking these actions can help to prevent food-borne illnesses, such as listeriosis, that can be very dangerous to your baby.  Do not eat raw  meat or fish. Do not eat fish that have high mercury content, such as tilefish, shark, swordfish, and king mackerel. Do not eat unpasteurized (raw) dairy.  Take a prenatal vitamin to help meet your additional vitamin and mineral needs during pregnancy, specifically for folic acid, iron, calcium, and vitamin D. This information is not intended to replace advice given to you by your health care provider. Make sure you discuss any questions you have with your health care provider. Document Released: 01/27/2014 Document Revised: 08/05/2018 Document Reviewed: 01/09/2017 Elsevier Patient Education  2020 [employer]. Prenatal Care Prenatal care is health care during pregnancy. It helps you and your unborn baby (fetus) stay as healthy as possible. Prenatal care may be provided by a midwife, a family practice health care provider, or a childbirth and pregnancy specialist (obstetrician). How does this affect me? During pregnancy, you will be closely monitored for any new conditions that might develop. To lower your risk of pregnancy complications, you and your health care provider will talk about any underlying conditions you have. How does this affect my baby? Early and consistent prenatal care increases the chance that your baby will be healthy during pregnancy. Prenatal care lowers the risk that your baby will be:  Born early (prematurely).  Smaller than expected at birth (small for gestational age). What can I expect at the first prenatal care visit? Your first prenatal care visit will likely be the longest. You should schedule your first prenatal care visit as soon as you know that you are pregnant. Your first visit is a good time to talk about any questions or concerns you have about pregnancy. At your visit, you and your health care provider will talk about:  Your medical history, including: ? Any past pregnancies. ? Your family's medical history. ? The baby's father's medical history. ? Any  long-term (chronic) health conditions you have and how you manage them. ? Any surgeries or procedures you have had. ? Any current over-the-counter or prescription medicines, herbs, or supplements you are taking.  Other factors that could pose a risk  to your baby, including:  Your home setting and your stress levels, including: ? Exposure to abuse or violence. ? Household financial strain. ? Mental health conditions you have.  Your daily health habits, including diet and exercise. Your health care provider will also:  Measure your weight, height, and blood pressure.  Do a physical exam, including a pelvic and breast exam.  Perform blood tests and urine tests to check for: ? Urinary tract infection. ? Sexually transmitted infections (STIs). ? Low iron levels in your blood (anemia). ? Blood type and certain proteins on red blood cells (Rh antibodies). ? Infections and immunity to viruses, such as hepatitis B and rubella. ? HIV (human immunodeficiency virus).  Do an ultrasound to confirm your baby's growth and development and to help predict your estimated due date (EDD). This ultrasound is done with a probe that is inserted into the vagina (transvaginal ultrasound).  Discuss your options for genetic screening.  Give you information about how to keep yourself and your baby healthy, including: ? Nutrition and taking vitamins. ? Physical activity. ? How to manage pregnancy symptoms such as nausea and vomiting (morning sickness). ? Infections and substances that may be harmful to your baby and how to avoid them. ? Food safety. ? Dental care. ? Working. ? Travel. ? Warning signs to watch for and when to call your health care provider. How often will I have prenatal care visits? After your first prenatal care visit, you will have regular visits throughout your pregnancy. The visit schedule is often as follows:  Up to week 28 of pregnancy: once every 4 weeks.  28-36 weeks: once  every 2 weeks.  After 36 weeks: every week until delivery. Some women may have visits more or less often depending on any underlying health conditions and the health of the baby. Keep all follow-up and prenatal care visits as told by your health care provider. This is important. What happens during routine prenatal care visits? Your health care provider will:  Measure your weight and blood pressure.  Check for fetal heart sounds.  Measure the height of your uterus in your abdomen (fundal height). This may be measured starting around week 20 of pregnancy.  Check the position of your baby inside your uterus.  Ask questions about your diet, sleeping patterns, and whether you can feel the baby move.  Review warning signs to watch for and signs of labor.  Ask about any pregnancy symptoms you are having and how you are dealing with them. Symptoms may include: ? Headaches. ? Nausea and vomiting. ? Vaginal discharge. ? Swelling. ? Fatigue. ? Constipation. ? Any discomfort, including back or pelvic pain. Make a list of questions to ask your health care provider at your routine visits. What tests might I have during prenatal care visits? You may have blood, urine, and imaging tests throughout your pregnancy, such as:  Urine tests to check for glucose, protein, or signs of infection.  Glucose tests to check for a form of diabetes that can develop during pregnancy (gestational diabetes mellitus). This is usually done around week 24 of pregnancy.  An ultrasound to check your baby's growth and development and to check for birth defects. This is usually done around week 20 of pregnancy.  A test to check for group B strep (GBS) infection. This is usually done around week 36 of pregnancy.  Genetic testing. This may include blood or imaging tests, such as an ultrasound. Some genetic tests are done during the first trimester  and some are done during the second trimester. What else can I expect  during prenatal care visits? Your health care provider may recommend getting certain vaccines during pregnancy. These may include:  A yearly flu shot (annual influenza vaccine). This is especially important if you will be pregnant during flu season.  Tdap (tetanus, diphtheria, pertussis) vaccine. Getting this vaccine during pregnancy can protect your baby from whooping cough (pertussis) after birth. This vaccine may be recommended between weeks 27 and 36 of pregnancy. Later in your pregnancy, your health care provider may give you information about:  Childbirth and breastfeeding classes.  Choosing a health care provider for your baby.  Umbilical cord banking.  Breastfeeding.  Birth control after your baby is born.  The hospital labor and delivery unit and how to tour it.  Registering at the hospital before you go into labor. Where to find more information  Office on Women's Health: LegalWarrants.gl  American Pregnancy Association: americanpregnancy.org  March of Dimes: marchofdimes.org Summary  Prenatal care helps you and your baby stay as healthy as possible during pregnancy.  Your first prenatal care visit will most likely be the longest.  You will have visits and tests throughout your pregnancy to monitor your health and your baby's health.  Bring a list of questions to your visits to ask your health care provider.  Make sure to keep all follow-up and prenatal care visits with your health care provider. This information is not intended to replace advice given to you by your health care provider. Make sure you discuss any questions you have with your health care provider. Document Released: 04/17/2003 Document Revised: 08/04/2018 Document Reviewed: 04/13/2017 Elsevier Patient Education  2020 [employer].    COVID-19 and Your Pregnancy FAQ  How can I prevent infection with COVID-19 during my pregnancy? Social distancing is key. Please limit any interactions in  public. Try and work from home if possible. Frequently wash your hands after touching possibly contaminated surfaces. Avoid touching your face.  Minimize trips to the store. Consider online ordering when possible.   Should I wear a mask? YES. It is recommended by the CDC that all people wear a cloth mask or facial covering in public. You should wear a mask to your visits in the office. This will help reduce transmission as well as your risk or acquiring COVID-19. New studies are showing that even asymptomatic individuals can spread the virus from talking.   Where can I get a mask? [place] and the city of [place] are partnering to provide masks to community members. You can pick up a mask from several locations. This website also has instructions about how to make a mask by sewing or without sewing by using a t-shirt or bandana.  https://www.[place].gov/i-want-to/learn-about/covid-19-information-and-updates/covid-19-face-mask-project  Studies have shown that if you were a tube or nylon stocking from pantyhose over a cloth mask it makes the cloth mask almost as effective as a N95 mask.  https://www.davis-walter.com/  What are the symptoms of COVID-19? Fever (greater than 100.4 F), dry cough, shortness of breath.  Am I more at risk for COVID-19 since I am pregnant? There is not currently data showing that pregnant women are more adversely impacted by COVID-19 than the general population. However, we know that pregnant women tend to have worse respiratory complications from similar diseases such as the flu and SARS and for this reason should be considered an at-risk population.  What do I do if I am experiencing the symptoms of COVID-19? Testing is being  limited because of test availability. If you are experiencing symptoms you should quarantine yourself, and the members of  your family, for at least 2 weeks at home.   Please visit this website for more information: RunningShows.co.za.html  When should I go to the Emergency Room? Please go to the emergency room if you are experiencing ANY of these symptoms*:  1.    Difficulty breathing or shortness of breath 2.    Persistent pain or pressure in the chest 3.    Confusion or difficulty being aroused (or awakened) 4.    Bluish lips or face  *This list is not all inclusive. Please consult our office for any other symptoms that are severe or concerning.  What do I do if I am having difficulty breathing? You should go to the Emergency Room for evaluation. At this time they have a tent set up for evaluating patients with COVID-19 symptoms.   How will my prenatal care be different because of the COVID-19 pandemic? It has been recommended to reduce the frequency of face-to-face visits and use resources such as telephone and virtual visits when possible. Using a scale, blood pressure machine and fetal doppler at home can further help reduce face-to-face visits. You will be provided with additional information on this topic.  We ask that you come to your visits alone to minimize potential exposures to  COVID-19.  How can I receive childbirth education? At this time in-person classes have been cancelled. You can register for online childbirth education, breastfeeding, and newborn care classes.  Please visit:  CyberComps.hu for more information  How will my hospital birth experience be different? The hospital is currently limiting visitors. This means that while you are in labor you can only have one person at the hospital with you. Additional family members will not be allowed to wait in the building or outside your room. Your one support person can be the father of the baby, a relative, a doula, or a friend. Once one support person is designated that  person will wear a band. This band cannot be shared with multiple people.  Nitrous Gas is not being offered for pain relief since the tubing and filter for the machine can not be sanitized in a way to guarantee prevention of transmission of COVID-19.  Nasal cannula use of oxygen for fetal indications has also been discontinued.  Currently a clear plastic sheet is being hung between mom and the delivering provider during pushing and delivery to help prevent transmission of COVID-19.      How long will I stay in the hospital for after giving birth? It is also recommended that discharge home be expedited during the COVID-19 outbreak. This means staying for 1 day after a vaginal delivery and 2 days after a cesarean section. Patients who need to stay longer for medical reasons are allowed to do so, but the goal will be for expedited discharge home.   What if I have COVID-19 and I am in labor? We ask that you wear a mask while on labor and delivery. We will try and accommodate you being placed in a room that is capable of filtering the air. Please call ahead if you are in labor and on your way to the hospital. The phone number for labor and delivery at [facility] is [phone].  If I have COVID-19 when my baby is born how can I prevent my baby from contracting COVID-19? This is an issue that will have to  be discussed on a case-by-case basis. Current recommendations suggest providing separate isolation rooms for both the mother and new infant as well as limiting visitors. However, there are practical challenges to this recommendation. The situation will assuredly change and decisions will be influenced by the desires of the mother and availability of space.  Some suggestions are the use of a curtain or physical barrier between mom and infant, hand hygiene, mom wearing a mask, or 6 feet of spacing between a mom and infant.   Can I breastfeed during the COVID-19 pandemic?   Yes,  breastfeeding is encouraged.  Can I breastfeed if I have COVID-19? Yes. Covid-19 has not been found in breast milk. This means you cannot give COVID-19 to your child through breast milk. Breast feeding will also help pass antibodies to fight infection to your baby.   What precautions should I take when breastfeeding if I have COVID-19? If a mother and newborn do room-in and the mother wishes to feed at the breast, she should put on a facemask and practice hand hygiene before each feeding.  What precautions should I take when pumping if I have COVID-19? Prior to expressing breast milk, mothers should practice hand hygiene. After each pumping session, all parts that come into contact with breast milk should be thoroughly washed and the entire pump should be appropriately disinfected per the manufacturer's instructions. This expressed breast milk should be fed to the newborn by a healthy caregiver.  What if I am pregnant and work in healthcare? Based on limited data regarding COVID-19 and pregnancy, ACOG currently does not propose creating additional restrictions on pregnant health care personnel because of COVID-19 alone. Pregnant women do not appear to be at higher risk of severe disease related to COVID-19. Pregnant health care personnel should follow CDC risk assessment and infection control guidelines for health care personnel exposed to patients with suspected or confirmed COVID-19. Adherence to recommended infection prevention and control practices is an important part of protecting all health care personnel in health care settings.    Information on COVID-19 in pregnancy is very limited; however, facilities may want to consider limiting exposure of pregnant health care personnel to patients with confirmed or suspected COVID-19 infection, especially during higher-risk procedures (eg, aerosol-generating procedures), if feasible, based on staffing availability.

## 2019-04-28 ENCOUNTER — Encounter: Payer: Self-pay | Admitting: Advanced Practice Midwife

## 2019-04-28 DIAGNOSIS — O0990 Supervision of high risk pregnancy, unspecified, unspecified trimester: Secondary | ICD-10-CM | POA: Insufficient documentation

## 2019-04-28 NOTE — Progress Notes (Signed)
New Obstetric Patient H&P    Chief Complaint: "Desires prenatal care"   History of Present Illness: Patient is a 33 y.o. [id number] Not Hispanic or Latino female, presents with amenorrhea and positive home pregnancy test. Patient's last menstrual period was 03/02/2019. and based on her LMP her EDD is Estimated Date of Delivery: 12/07/19 and her EGA is [redacted]w[redacted]d. Based on ultrasound today EDD is 12/22/2019 and her EGA is [redacted]w[redacted]d. Cycles are 4. days, regular, and occur approximately every : 30 days. Her last pap smear was 3 weeks ago and was no abnormalities.    She had a urine pregnancy test which was positive 1 or 2 week(s)  ago. Her last menstrual period was normal and lasted for  4 or 5 day(s). Since her LMP she claims she has experienced fatigue, nausea and rare vomiting. She denies vaginal bleeding. Her past medical history is contributory for thyroid nodule, abnormal PAP smear with history of LEEP in 2013. Her prior pregnancies are notable for G1 TAB, G2 SAB, G3 2008 7# Female SVD, G60 2010 7# Female SVD, G5 56 8# Female SVD, G6 2016 7# Female SVD, G7 2018 7# Female SVD  Since her LMP, she admits to the use of tobacco products  no She claims she has gained  no pounds since the start of her pregnancy.  There are cats in the home in the home  no  She admits close contact with children on a regular basis  yes  She has had chicken pox in the past yes She has had Tuberculosis exposures, symptoms, or previously tested positive for TB   no Current or past history of domestic violence. no  Genetic Screening/Teratology Counseling: (Includes patient, baby's father, or anyone in either family with:)   67. Patient's age >/= 54 at [facility]  no 2. Thalassemia ([place], [place], [place], or Asian background): MCV<80  no 3. Neural tube defect (meningomyelocele, spina bifida, anencephaly)  no 4. Congenital heart defect  no  5. Down syndrome  no 6. Tay-Sachs (Jewish, [place])  no 7. Canavan's Disease  no 8. Sickle  cell disease or trait (African)  no  9. Hemophilia or other blood disorders  no  10. Muscular dystrophy  no  11. Cystic fibrosis  no  12. Huntington's Chorea  no  13. Mental retardation/autism  no 14. Other inherited genetic or chromosomal disorder  no 15. Maternal metabolic disorder (DM, PKU, etc)  no 16. Patient or FOB with a child with a birth defect not listed above no  16a. Patient or FOB with a birth defect themselves no 17. Recurrent pregnancy loss, or stillbirth  no  18. Any medications since LMP other than prenatal vitamins (include vitamins, supplements, OTC meds, drugs, alcohol)  Buspar, Prozac, Vistaril 19. Any other genetic/environmental exposure to discuss  no  Infection History:   1. Lives with someone with TB or TB exposed  no  2. Patient or partner has history of genital herpes  no 3. Rash or viral illness since LMP  no 4. History of STI (GC, CT, HPV, syphilis, HIV)  no 5. History of recent travel :  no  Other pertinent information:  no     Review of Systems:10 point review of systems negative unless otherwise noted in HPI  Past Medical History:  Past Medical History:  Diagnosis Date  . Anxiety   . CIN III (cervical intraepithelial neoplasia III)    2011 or 2012-LEEP  . Depression   . 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 Multinodular goiter     Past Surgical History:  Past Surgical History:  Procedure Laterality Date  . 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 CERVICAL BIOPSY  W/ LOOP ELECTRODE EXCISION  2014   [facility]  . COLPOSCOPY  06/15/2012   [facility], Dr Ardell Isaacs: CIN III    Gynecologic History: Patient's last menstrual period was 03/02/2019.  Obstetric History: [id number]  Family History:  Family History  Problem Relation Age of Onset  . Insomnia Mother   . Colon cancer Mother 51       colon cancer  . Cervical cancer Maternal Aunt        Cervical Cancer  . Colon cancer Paternal Aunt         Colon cancer  . Asthma Brother   . Diabetes Maternal Grandmother   . Diabetes Maternal Grandfather     Social History:  Social History   Socioeconomic History  . Marital status: Divorced    Spouse name: Freida Busman  . Number of children: 5  . Years of education: Not on file  . Highest education level: Associate degree: occupational, Hotel manager, or vocational program  Occupational History  . Occupation: unemployed  Tobacco Use  . Smoking status: Former Smoker    Packs/day: 0.50    Years: 10.00    Pack years: 5.00    Quit date: 06/27/2015    Years since quitting: 3.8  . Smokeless tobacco: Never Used  Substance and Sexual Activity  . Alcohol use: No    Alcohol/week: 0.0 standard drinks  . Drug use: No  . Sexual activity: Yes    Partners: Male    Birth control/protection: None  Other Topics Concern  . Not on file  Social History Narrative  . Not on file   Social Determinants of Health   Financial Resource Strain:   . Difficulty of Paying Living Expenses: Not on file  Food Insecurity:   . Worried About Charity fundraiser in the Last Year: Not on file  . Ran Out of Food in the Last Year: Not on file  Transportation Needs:   . Lack of Transportation (Medical): Not on file  . Lack of Transportation (Non-Medical): Not on file  Physical Activity: Unknown  . Days of Exercise per Week: 0 days  . Minutes of Exercise per Session: Not asked  Stress: Stress Concern Present  . Feeling of Stress : To some extent  Social Connections: Unknown  . Frequency of Communication with Friends and Family: Not on file  . Frequency of Social Gatherings with Friends and Family: Never  . Attends Religious Services: Not on file  . Active Member of Clubs or Organizations: Not on file  . Attends Archivist Meetings: Not on file  . Marital Status: Not on file  Intimate Partner Violence:   . Fear of Current or Ex-Partner: Not on file  . Emotionally Abused: Not on file  . Physically  Abused: Not on file  . Sexually Abused: Not on file    Allergies:  No Known Allergies  Medications: Prior to Admission medications   Medication Sig Start Date End Date Taking? Authorizing Provider  busPIRone (BUSPAR) 15 MG tablet Take 1 tablet (15 mg total) by mouth 3 (three) times daily. 10/04/18  Yes Poulose, Bethel Born, NP  FLUoxetine (PROZAC) 20 MG capsule TAKE 3 CAPSULES (60 MG TOTAL) BY MOUTH DAILY. (FOR MOOD STOP ESCITALOPRAM) 01/05/19  Yes Hubbard Hartshorn,  FNP  ferrous sulfate 325 (65 FE) MG tablet Take 325 mg by mouth daily with breakfast.    [provider]  hydrOXYzine (ATARAX/VISTARIL) 25 MG tablet Take 1 tablet (25 mg total) by mouth 2 (two) times daily as needed for anxiety. 04/27/19   Rod Can, CNM    Physical Exam Vitals: Blood pressure 120/80, weight 236 lb (107 kg), last menstrual period 03/02/2019.  General: NAD HEENT: normocephalic, anicteric Thyroid: no enlargement, no palpable nodules Pulmonary: No increased work of breathing, CTAB Cardiovascular: RRR, distal pulses 2+ Abdomen: NABS, soft, non-tender, non-distended.  Umbilicus without lesions.  No hepatomegaly, splenomegaly or masses palpable. No evidence of hernia  Genitourinary: deferred for PAP interval, Aptima patient collected Extremities: no edema, erythema, or tenderness Neurologic: Grossly intact Psychiatric: mood appropriate, affect full   Assessment: 33 y.o. [id number] at [redacted]w[redacted]d presenting to initiate prenatal care  Plan: 1) Avoid alcoholic beverages. 2) Patient encouraged not to smoke.  3) Discontinue the use of all non-medicinal drugs and chemicals.  4) Take prenatal vitamins daily.  5) Nutrition, food safety (fish, cheese advisories, and high nitrite foods) and exercise discussed. 6) Hospital and practice style discussed with cross coverage system.  7) Genetic Screening, such as with 1st Trimester Screening, cell free fetal DNA, AFP testing, and Ultrasound, as well as with amniocentesis  and CVS as appropriate, is discussed with patient. At the conclusion of today's visit patient requested genetic testing 8) Patient is asked about travel to areas at risk for the Zika virus, and counseled to avoid travel and exposure to mosquitoes or sexual partners who may have themselves been exposed to the virus. Testing is discussed, and will be ordered as appropriate.  9) Urine culture and aptima done today 10) Return to clinic in 2 weeks for viability scan and rob 11) Labs to be done at 12 week visit including NOB panel, early 1 hr gtt, MaterniT Maxeys, [facility] 04/28/2019, 12:04 PM

## 2019-04-29 LAB — URINE CULTURE

## 2019-05-02 LAB — CERVICOVAGINAL ANCILLARY ONLY
Chlamydia: NEGATIVE
Comment: NEGATIVE
Comment: NORMAL
Neisseria Gonorrhea: NEGATIVE

## 2019-05-13 ENCOUNTER — Ambulatory Visit (INDEPENDENT_AMBULATORY_CARE_PROVIDER_SITE_OTHER): Payer: Medicaid Other | Admitting: Obstetrics and Gynecology

## 2019-05-13 ENCOUNTER — Ambulatory Visit (INDEPENDENT_AMBULATORY_CARE_PROVIDER_SITE_OTHER): Payer: Medicaid Other

## 2019-05-13 ENCOUNTER — Other Ambulatory Visit: Payer: Self-pay

## 2019-05-13 ENCOUNTER — Encounter: Payer: Self-pay | Admitting: Obstetrics and Gynecology

## 2019-05-13 VITALS — BP 118/74 | Wt 236.0 lb

## 2019-05-13 DIAGNOSIS — O3481 Maternal care for other abnormalities of pelvic organs, first trimester: Secondary | ICD-10-CM

## 2019-05-13 DIAGNOSIS — O0990 Supervision of high risk pregnancy, unspecified, unspecified trimester: Secondary | ICD-10-CM

## 2019-05-13 DIAGNOSIS — Z3A01 Less than 8 weeks gestation of pregnancy: Secondary | ICD-10-CM

## 2019-05-13 DIAGNOSIS — O200 Threatened abortion: Secondary | ICD-10-CM

## 2019-05-13 DIAGNOSIS — N8311 Corpus luteum cyst of right ovary: Secondary | ICD-10-CM | POA: Diagnosis not present

## 2019-05-13 DIAGNOSIS — Z3A10 10 weeks gestation of pregnancy: Secondary | ICD-10-CM

## 2019-05-13 NOTE — Progress Notes (Signed)
Routine Prenatal Care Visit  Subjective  Alexandria [name] is a 34 y.o. [id number] at [redacted]w[redacted]d being seen today for ongoing prenatal care.  She is currently monitored for the following issues for this low-risk pregnancy and has CIN III (cervical intraepithelial neoplasia grade III) with severe dysplasia; Anxiety; Goiter, nontoxic, multinodular; Verruca vulgaris; Neoplasm of uncertain behavior of skin; Depression, recurrent (H. Rivera Colon); Obesity (BMI 35.0-39.9 without comorbidity); Thyroid nodule; Oligomenorrhea; and Supervision of high risk pregnancy, antepartum on their problem list.  ----------------------------------------------------------------------------------- Patient reports no complaints.    . Vag. Bleeding: None.   . Leaking Fluid denies.  U/s today shows only small interval growth of embryo from a gestational age of [redacted]w[redacted]d to [redacted]w[redacted]d over a 2 week period of time. Her prior ultrasound showed a heart rate of 88 bpm (though this could have been maternal), and a gestational sac. Greater than 2 weeks have passed and there is no embryo with a heart rate. This does meet criteria for failed pregnancy.  ----------------------------------------------------------------------------------- The following portions of the patient's history were reviewed and updated as appropriate: allergies, current medications, past family history, past medical history, past social history, past surgical history and problem list. Problem list updated.  Objective  Blood pressure 118/74, weight 236 lb (107 kg), last menstrual period 03/02/2019. Pregravid weight 236 lb (107 kg) Total Weight Gain 0 lb (0 kg) Urinalysis: Urine Protein    Urine Glucose    Fetal Status:           General:  Alert, oriented and cooperative. Patient is in no acute distress.  Skin: Skin is warm and dry. No rash noted.   Cardiovascular: Normal heart rate noted  Respiratory: Normal respiratory effort, no problems with respiration noted  Abdomen: Soft, gravid,  appropriate for gestational age. Pain/Pressure: Absent     Pelvic:  Cervical exam deferred        Extremities: Normal range of motion.     Mental Status: Normal mood and affect. Normal behavior. Normal judgment and thought content.   Imaging Results US OB Comp Less 14 Wks  Result Date: 05/13/2019 Patient Name: Alexandria [name] DOB: [birth year] MRN: [id number] ULTRASOUND REPORT Location: [facility] Date of Service: 05/13/2019 Indications:dating Findings: Nelda Marseille intrauterine pregnancy is visualized with a CRL consistent with [redacted]w[redacted]d gestation, giving an (U/S) EDD of 01/05/2020. The (U/S) EDD is not consistent with the clinically established EDD of 12/07/2019. FHR:  Is not visualized. CRL measurement: 4.5 mm Yolk sac is questionably visualized vs. Amnion. 12.0 mm GS: 14.0 mm Right Ovary is normal in appearance. Left Ovary is normal appearance. Corpus luteal cyst:  Right ovary Survey of the adnexa demonstrates no adnexal masses. There is no free peritoneal fluid in the cul de sac. Impression: 1. There is a single gestational sac seen within the uterus. There is a questionable fetal pole measuring 6 w1d and a questionable yolk sac vs amnion measuring 12.0 mm. No heartbeat is seen today. 2. Normal appearing uterus and ovaries. Recommendations: 1.Clinical correlation with the patient's History and Physical Exam. 2. The findings would be consistent with failed pregnancy.Gweneth Dimitri, RT *Criteria from the Society of Radiologists in Ultrasound Multispecialty Consensus Conference on Early First Trimester Diagnosis of Miscarriage and Exclusion of a Viable Intrauterine Pregnancy, October 2012. The ultrasound images and findings were reviewed by me and I agree with the above report. Prentice Docker, MD, [facility], [facility] 05/13/2019 4:36 PM       Assessment   34 y.o. [id number] at  [redacted]w[redacted]d by  12/07/2019, by Last Menstrual Period presenting for routine prenatal visit  Plan    PREGNANCY 7 Problems (from 03/02/19 to present)    No problems associated with this episode.       Preterm labor symptoms and general obstetric precautions including but not limited to vaginal bleeding, contractions, leaking of fluid and fetal movement were reviewed in detail with the patient. Please refer to After Visit Summary for other counseling recommendations.   Discussed ultrasound findings in detail.  I am highly concerned that this is a failed pregnancy and she does meet criteria per a cog practice bulletin 200.  Given the sensitivity of this issue, will perform repeat ultrasound in 1 week.  We briefly reviewed treatment options, which include, expectant management for up to 8 weeks, treatment with Cytotec, dilation and curettage.  We will have further discussions based on the results from her next ultrasound.  Return in about 1 week (around 05/20/2019) for u/s for dating/viability and follow up Dr. Glennon Mac.  Prentice Docker, MD, [facility], [facility] 05/13/2019 6:00 PM

## 2019-05-18 ENCOUNTER — Telehealth: Payer: Self-pay

## 2019-05-18 NOTE — Telephone Encounter (Signed)
Patient calling to speak with Velva Harman, aware at lunch, if she could get a return call when back.

## 2019-05-18 NOTE — Telephone Encounter (Signed)
Pt's boyfriend called after hour nurse 05/17/19 5:59pm c/o pt 10-12wks; went to restroom and noticed some blood; happened once; having cramps; no fever.  [phone]  Boyfriend states pt is busy; will have her call me back.

## 2019-05-18 NOTE — Telephone Encounter (Signed)
Returned pt's call; states she only saw blood that one time and it looked like coffee grounds and the cramping has stopped so she did not go to the ED.  Pt asked if u/s was for her or the doctor.  Adv it was to help the doctor know more about what's going on with her; it will help make a more firm diagnosis.

## 2019-05-19 ENCOUNTER — Ambulatory Visit (INDEPENDENT_AMBULATORY_CARE_PROVIDER_SITE_OTHER): Payer: Medicaid Other

## 2019-05-19 ENCOUNTER — Other Ambulatory Visit: Payer: Self-pay

## 2019-05-19 ENCOUNTER — Encounter: Payer: Self-pay | Admitting: Obstetrics and Gynecology

## 2019-05-19 ENCOUNTER — Ambulatory Visit (INDEPENDENT_AMBULATORY_CARE_PROVIDER_SITE_OTHER): Payer: Medicaid Other | Admitting: Obstetrics and Gynecology

## 2019-05-19 VITALS — BP 122/74 | Wt 237.0 lb

## 2019-05-19 DIAGNOSIS — Z3A01 Less than 8 weeks gestation of pregnancy: Secondary | ICD-10-CM

## 2019-05-19 DIAGNOSIS — O021 Missed abortion: Secondary | ICD-10-CM | POA: Diagnosis not present

## 2019-05-19 DIAGNOSIS — O200 Threatened abortion: Secondary | ICD-10-CM

## 2019-05-19 DIAGNOSIS — O3481 Maternal care for other abnormalities of pelvic organs, first trimester: Secondary | ICD-10-CM

## 2019-05-19 DIAGNOSIS — N8311 Corpus luteum cyst of right ovary: Secondary | ICD-10-CM | POA: Diagnosis not present

## 2019-05-19 NOTE — Progress Notes (Signed)
Gynecology History and Physical   Chief Complaint  Patient presents with  . Follow up ultrasound for fetal viability  Pre-operative visit   History of Present Illness: Patient is a 34 y.o. female who presents for a pre-operative visit for a suction, dilation and curettage for a missed abortion. She has had three ultrasound since 04/27/2019, including one today, with no notable embryonic development and no cardiac activity.  She has been previously counseled regarding the methods of management of a missed spontaneous abortion, including; clinical observation (manage expectantly), medication management with misoprostol, and surgical management with suction, dilation and curettage. She elects surgical management.  She has had some mild vaginal spotting.   Past Medical History:  Diagnosis Date  . Anxiety   . CIN III (cervical intraepithelial neoplasia III)    2011 or 2012-LEEP  . Depression   . High risk HPV infection    followed by GYN  . Multinodular goiter     Past Surgical History:  Procedure Laterality Date  . 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 CERVICAL BIOPSY  W/ LOOP ELECTRODE EXCISION  2014   [facility]  . COLPOSCOPY  06/15/2012   [facility], Dr Ardell Isaacs: CIN III    Family History  Problem Relation Age of Onset  . Insomnia Mother   . Colon cancer Mother 59       colon cancer  . Cervical cancer Maternal Aunt        Cervical Cancer  . Colon cancer Paternal Aunt        Colon cancer  . Asthma Brother   . Diabetes Maternal Grandmother   . Diabetes Maternal Grandfather     Social History   Socioeconomic History  . Marital status: Divorced    Spouse name: Freida Busman  . Number of children: 5  . Years of education: Not on file  . Highest education level: Associate degree: occupational, Hotel manager, or vocational program  Occupational History  . Occupation: unemployed  Tobacco Use  . Smoking status: Former Smoker    Packs/day:  0.50    Years: 10.00    Pack years: 5.00    Quit date: 06/27/2015    Years since quitting: 3.8  . Smokeless tobacco: Never Used  Substance and Sexual Activity  . Alcohol use: No    Alcohol/week: 0.0 standard drinks  . Drug use: No  . Sexual activity: Yes    Partners: Male    Birth control/protection: None  Other Topics Concern  . Not on file  Social History Narrative  . Not on file   Social Determinants of Health   Financial Resource Strain:   . Difficulty of Paying Living Expenses: Not on file  Food Insecurity:   . Worried About Charity fundraiser in the Last Year: Not on file  . Ran Out of Food in the Last Year: Not on file  Transportation Needs:   . Lack of Transportation (Medical): Not on file  . Lack of Transportation (Non-Medical): Not on file  Physical Activity: Unknown  . Days of Exercise per Week: 0 days  . Minutes of Exercise per Session: Not asked  Stress: Stress Concern Present  . Feeling of Stress : To some extent  Social Connections: Unknown  . Frequency of Communication with Friends and Family: Not on file  . Frequency of Social Gatherings with Friends and Family: Never  . Attends Religious Services: Not on file  . Active Member of Clubs or Organizations:  Not on file  . Attends Archivist Meetings: Not on file  . Marital Status: Not on file  Intimate Partner Violence:   . Fear of Current or Ex-Partner: Not on file  . Emotionally Abused: Not on file  . Physically Abused: Not on file  . Sexually Abused: Not on file    No Known Allergies  Prior to Admission medications   Medication Sig Start Date End Date Taking? Authorizing Provider  busPIRone (BUSPAR) 15 MG tablet Take 1 tablet (15 mg total) by mouth 3 (three) times daily. 10/04/18   Poulose, Bethel Born, NP  ferrous sulfate 325 (65 FE) MG tablet Take 325 mg by mouth daily with breakfast.    [provider]  FLUoxetine (PROZAC) 20 MG capsule TAKE 3 CAPSULES (60 MG TOTAL) BY MOUTH  DAILY. (FOR MOOD STOP ESCITALOPRAM) 01/05/19   Hubbard Hartshorn, FNP  hydrOXYzine (ATARAX/VISTARIL) 25 MG tablet Take 1 tablet (25 mg total) by mouth 2 (two) times daily as needed for anxiety. 04/27/19   Rod Can, CNM    Physical Exam BP 122/74   Wt 237 lb (107.5 kg)   LMP 03/02/2019   BMI 40.68 kg/m   Physical Exam Constitutional:      General: She is not in acute distress.    Appearance: Normal appearance. She is well-developed.  HENT:     Head: Normocephalic and atraumatic.  Eyes:     General: No scleral icterus.    Conjunctiva/sclera: Conjunctivae normal.  Cardiovascular:     Rate and Rhythm: Normal rate and regular rhythm.     Heart sounds: No murmur. No friction rub. No gallop.   Pulmonary:     Effort: Pulmonary effort is normal. No respiratory distress.     Breath sounds: Normal breath sounds. No wheezing or rales.  Abdominal:     General: Bowel sounds are normal. There is no distension.     Palpations: Abdomen is soft. There is no mass.     Tenderness: There is no abdominal tenderness. There is no guarding or rebound.  Musculoskeletal:        General: Normal range of motion.     Cervical back: Normal range of motion and neck supple.  Neurological:     General: No focal deficit present.     Mental Status: She is alert and oriented to person, place, and time.     Cranial Nerves: No cranial nerve deficit.  Skin:    General: Skin is warm and dry.     Findings: No erythema.  Psychiatric:        Mood and Affect: Mood normal.        Behavior: Behavior normal.        Judgment: Judgment normal.    Imaging Results US OB Comp Less 14 Wks  Result Date: 05/19/2019 Patient Name: FABLE MURE DOB: 07/12/1985 MRN: [id number] ULTRASOUND REPORT Location: [facility] Date of Service: 05/19/2019 Indications:Threatened AB Findings: Nelda Marseille intrauterine pregnancy is visualized with a CRL consistent with [redacted]w[redacted]d gestation, giving an (U/S) EDD of 01/14/2020. The (U/S) EDD is not  consistent with the clinically established EDD of 12/07/2019. FHR: is not visible CRL measurement: 2.3 mm Yolk sac is not visualized. Amnion: not visualized Right Ovary is normal in appearance. Left Ovary is normal appearance. Corpus luteal cyst:  Right ovary Survey of the adnexa demonstrates no adnexal masses. There is no free peritoneal fluid in the cul de sac. Impression: 1. [redacted]w[redacted]d Nonviable Singleton Intrauterine pregnancy by U/S. 2. (U/S)  EDD is not consistent with Clinically established EDD of 12/07/2019. 3. This meets criteria for a failed pregnancy.Gweneth Dimitri, RT *Criteria from the Society of Radiologists in Ultrasound Multispecialty Consensus Conference on Early First Trimester Diagnosis of Miscarriage and Exclusion of a Viable Intrauterine Pregnancy, October 2012. The ultrasound images and findings were reviewed by me and I agree with the above report. Prentice Docker, MD, [facility], [facility] 05/19/2019 4:35 PM        Assessment: 34 y.o. [id number] with  1. Missed abortion      Plan: Problem List Items Addressed This Visit    None    Visit Diagnoses    Missed abortion    -  Primary     Condolences were offered to the patient and her family.  I stressed that while emotionally difficult, that this did not occur because of an actions or inactions by the patient.  Somewhere between 10-20% of identified first trimester pregnancies will unfortunately end in miscarriage.  Given this relatively high incidence rate, further diagnostic testing such as chromosome analysis is generally not clinically relevant nor recommended.  Although the chromosomal abnormalities have been implicated at rates as high as 70% in some studies, these are generally random and do not infer and increased risk of recurrence with subsequent pregnancies.  However, 3 or more consecutive first trimester losses are relatively uncommon, and these patient generally do benefit from additional work up  to determine a potential modifiable etiology.   We briefly discussed management options including expectant management, medical management, and surgical management as well as their relative success rates and complications. Approximately 80% of first trimester miscarriages will pass successfully but may require a time frame of up to 8 weeks ([place] 200 "Early Pregnancy Loss").  Medical management using 867mcg of misoprostil administered every 3-hrs as needed for up to 2 doses speeds up the time frame to completion significantly, has literature supporting its use up to 63 days or [redacted]w[redacted]d gestation and results in a passage rate of 84-85% (ACOG Practice Bulletin 143 March 2014 "Medical Management of First-Trimester Abortion").  Dilation and curettage has the highest rate of uterine evacuation, but carries with is operative cost, surgical and anesthetic risk.  While these risk are relatively small they nevertheless include infection, bleeding, uterine perforation, formation of uterine synechia, and in rare cases death.   She wishes to proceed with surgical management. Will schedule for as soon as possible.   The patient is Rh negative and rhogam is therefore is indicated after surgery or if spontaneously passes pregnancy prior to surgery.     Prentice Docker, MD, [facility], [facility] 05/19/2019 5:30 PM

## 2019-05-20 ENCOUNTER — Other Ambulatory Visit: Payer: Self-pay | Admitting: Obstetrics and Gynecology

## 2019-05-20 ENCOUNTER — Telehealth: Payer: Self-pay | Admitting: Obstetrics and Gynecology

## 2019-05-20 ENCOUNTER — Other Ambulatory Visit: Admission: RE | Admit: 2019-05-20 | Payer: Medicaid Other | Source: Ambulatory Visit

## 2019-05-20 DIAGNOSIS — O021 Missed abortion: Secondary | ICD-10-CM

## 2019-05-20 MED ORDER — HYDROCODONE-ACETAMINOPHEN 5-325 MG PO TABS: 1.0000 | ORAL_TABLET | Freq: Four times a day (QID) | ORAL | 0 refills | Status: DC | PRN

## 2019-05-20 MED ORDER — MISOPROSTOL 200 MCG PO TABS: 800.0000 ug | ORAL_TABLET | Freq: Every day | ORAL | 0 refills | Status: AC

## 2019-05-20 MED ORDER — ONDANSETRON 4 MG PO TBDP: 4.0000 mg | ORAL_TABLET | Freq: Four times a day (QID) | ORAL | 0 refills | Status: DC | PRN

## 2019-05-20 NOTE — Telephone Encounter (Signed)
-----   Message from Will Bonnet, MD sent at 05/19/2019  5:11 PM EST ----- Regarding: Schedule surgery Surgery Booking Request Patient Full Name:  Alexandria [name]  MRN: [id number]  DOB: 1985/10/01  Surgeon: Prentice Docker, MD  Requested Surgery Date and Time: 05/24/2019 Primary Diagnosis AND Code: Missed abortion [O02.1] Secondary Diagnosis and Code:  Surgical Procedure: Suction Dilation and Curettage L&D Notification: No Admission Status: same day surgery Length of Surgery: 45 minutes Special Case Needs: No H&P: No - will go day of surgery Phone Interview???:  Yes Interpreter: No Language:  Medical Clearance:  No Special Scheduling Instructions: needs covid testing Friday 1/22 Any known health/anesthesia issues, diabetes, sleep apnea, latex allergy, defibrillator/pacemaker?: No Acuity: P1   (P1 highest, P2 delay may cause harm, P3 low, elective gyn, P4 lowest)

## 2019-05-20 NOTE — Telephone Encounter (Signed)
Lmtrc

## 2019-05-20 NOTE — Telephone Encounter (Signed)
Spoke with her fiance. I did not give any specific information. However, since she had left a message with a specific request, I did let him know, in general terms, that we could honor her request. She is welcome to wait until Monday to talk with me directly. She was unavailable at this time.  I will go ahead and since in the medication for her now.  Prentice Docker, MD, [facility], [facility] 05/20/2019 6:15 PM

## 2019-05-21 ENCOUNTER — Emergency Department: Payer: Medicaid Other

## 2019-05-21 ENCOUNTER — Other Ambulatory Visit: Payer: Self-pay

## 2019-05-21 ENCOUNTER — Emergency Department
Admission: EM | Admit: 2019-05-21 | Discharge: 2019-05-21 | Disposition: A | Discharge status: Home / Self Care | Payer: Medicaid Other | Attending: Student | Admitting: Student

## 2019-05-21 DIAGNOSIS — Z79899 Other long term (current) drug therapy: Secondary | ICD-10-CM | POA: Insufficient documentation

## 2019-05-21 DIAGNOSIS — O209 Hemorrhage in early pregnancy, unspecified: Secondary | ICD-10-CM | POA: Insufficient documentation

## 2019-05-21 DIAGNOSIS — Z23 Encounter for immunization: Secondary | ICD-10-CM | POA: Insufficient documentation

## 2019-05-21 DIAGNOSIS — O021 Missed abortion: Secondary | ICD-10-CM | POA: Diagnosis present

## 2019-05-21 DIAGNOSIS — N939 Abnormal uterine and vaginal bleeding, unspecified: Secondary | ICD-10-CM

## 2019-05-21 DIAGNOSIS — Z3A00 Weeks of gestation of pregnancy not specified: Secondary | ICD-10-CM | POA: Insufficient documentation

## 2019-05-21 DIAGNOSIS — Z87891 Personal history of nicotine dependence: Secondary | ICD-10-CM | POA: Insufficient documentation

## 2019-05-21 LAB — CBC
HCT: 40.9 % (ref 36.0–46.0)
Hemoglobin: 13.6 g/dL (ref 12.0–15.0)
MCH: 28.1 pg (ref 26.0–34.0)
MCHC: 33.3 g/dL (ref 30.0–36.0)
MCV: 84.5 fL (ref 80.0–100.0)
Platelets: 294 10*3/uL (ref 150–400)
RBC: 4.84 MIL/uL (ref 3.87–5.11)
RDW: 12.2 % (ref 11.5–15.5)
WBC: 9.2 10*3/uL (ref 4.0–10.5)
nRBC: 0 % (ref 0.0–0.2)

## 2019-05-21 LAB — TYPE AND SCREEN
ABO/RH(D): A NEG
Antibody Screen: NEGATIVE

## 2019-05-21 LAB — BASIC METABOLIC PANEL
Anion gap: 11 (ref 5–15)
BUN: 9 mg/dL (ref 6–20)
CO2: 24 mmol/L (ref 22–32)
Calcium: 9.1 mg/dL (ref 8.9–10.3)
Chloride: 100 mmol/L (ref 98–111)
Creatinine, Ser: 0.66 mg/dL (ref 0.44–1.00)
GFR calc Af Amer: >60 mL/min (ref >60)
GFR calc non Af Amer: >60 mL/min (ref >60)
Glucose, Bld: 114 mg/dL — ABNORMAL HIGH (ref 70–99)
Potassium: 3.2 mmol/L — ABNORMAL LOW (ref 3.5–5.1)
Sodium: 135 mmol/L (ref 135–145)

## 2019-05-21 LAB — HEMOGLOBIN AND HEMATOCRIT, BLOOD
HCT: 36.8 % (ref 36.0–46.0)
Hemoglobin: 12.2 g/dL (ref 12.0–15.0)

## 2019-05-21 MED ORDER — RHO D IMMUNE GLOBULIN 1500 UNIT/2ML IJ SOSY
300.0000 ug | PREFILLED_SYRINGE | Freq: Once | INTRAMUSCULAR | Status: AC
  Administered 2019-05-21: 300 ug via INTRAMUSCULAR
  Filled 2019-05-21: qty 2

## 2019-05-21 MED ORDER — SODIUM CHLORIDE 0.9 % IV BOLUS
1000.0000 mL | Freq: Once | INTRAVENOUS | Status: AC
  Administered 2019-05-21: 16:00:00 1000 mL via INTRAVENOUS

## 2019-05-21 NOTE — ED Notes (Signed)
Pt appears pale, non-diaphoretic, and c/o fatigue and heavy vaginal bleeding. Pt reports saturating 2 pads/hr and states she has a history of blood transfusions.

## 2019-05-21 NOTE — ED Provider Notes (Signed)
Procedures     ----------------------------------------- 8:20 PM on 05/21/2019 -----------------------------------------   Pelvic ultrasound appears normal, no retained POC's.  Patient reports that her bleeding seems to be tapering off.  She feels eager to go home.  Plan to discharge to follow-up with OB in 2 days if bleeding has not stopped or if she has other concerns.   Carrie Mew, MD 05/21/19 2021

## 2019-05-21 NOTE — ED Notes (Signed)
Pt educated on plan of care- per MD pt is to be d/c'd- pt husband notified to collect pt. Pt provided maternity pad/underwear to change into. Pt verbalizes understanding.

## 2019-05-21 NOTE — ED Notes (Signed)
IM injection to come from lab (see emar)- called lab at this time to request injection, lab states they will call back once injection is released.

## 2019-05-21 NOTE — ED Notes (Signed)
Pt changed into hospital gown and chux applied.

## 2019-05-21 NOTE — ED Triage Notes (Addendum)
Pt arrived via EMS for report of miscarriage - pt had a missed miscarriage and was given medicatino to take to finish passing baby - she took these meds at 44mn and then started bleeding at 2am and has been soaking through 2-3 pads an hour since - pt has hx of blood transfusion after delivery with previous children

## 2019-05-21 NOTE — ED Provider Notes (Signed)
[facility] Emergency Department Provider Note  ____________________________________________   First MD Initiated Contact with Patient 05/21/19 1220     (approximate)  I have reviewed the triage vital signs and the nursing notes.  History  Chief Complaint Miscarriage    HPI Alexandria [name] is a 34 y.o. female who presents for heavy vaginal bleeding, in setting of missed abortion, s/p Cytotec around midnight.   Patient diagnosed with a missed abortion on 1/21 via ultrasound. Per chart review, opted for medication management and self administered 800 mcg misoprostol around midnight. Around 2-3 AM started to have heavy vaginal bleeding and lower abdominal cramping. States she has soaked through 2-3 pads per hour for the last several hours. Has passed clots, and thinks she may have passed fetal tissue as well. Also having lower abdominal cramping, similar to menses, constant, no radiation, moderate in severity, no alleviating/aggravating components.   Feels generally weak and fatigued. Required transfusion after the delivery of her last two children. Not on any anticoagulation.    OBGYN: [facility] Blood type: A negative   Past Medical Hx Past Medical History:  Diagnosis Date  . Anxiety   . CIN III (cervical intraepithelial neoplasia III)    2011 or 2012-LEEP  . Depression   . High risk HPV infection    followed by GYN  . Multinodular goiter     Problem List Patient Active Problem List   Diagnosis Date Noted  . Supervision of high risk pregnancy, antepartum 04/28/2019  . Oligomenorrhea 01/08/2018  . Thyroid nodule 08/07/2017  . Obesity (BMI 35.0-39.9 without comorbidity) 07/14/2016  . Depression, recurrent ([place]) 07/11/2016  . Neoplasm of uncertain behavior of skin 05/26/2016  . Verruca vulgaris 10/18/2015  . 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 Anxiety 10/06/2012  . CIN III (cervical intraepithelial neoplasia grade III) with severe  dysplasia 08/06/2012    Past Surgical Hx Past Surgical History:  Procedure Laterality Date  . 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 CERVICAL BIOPSY  W/ LOOP ELECTRODE EXCISION  2014   [facility]  . COLPOSCOPY  06/15/2012   [facility], Dr Ardell Isaacs: CIN III    Medications Prior to Admission medications   Medication Sig Start Date End Date Taking? Authorizing Provider  busPIRone (BUSPAR) 15 MG tablet Take 1 tablet (15 mg total) by mouth 3 (three) times daily. 10/04/18   Poulose, Bethel Born, NP  ferrous sulfate 325 (65 FE) MG tablet Take 325 mg by mouth daily with breakfast.    [provider]  FLUoxetine (PROZAC) 20 MG capsule TAKE 3 CAPSULES (60 MG TOTAL) BY MOUTH DAILY. (FOR MOOD STOP ESCITALOPRAM) 01/05/19   Hubbard Hartshorn, FNP  HYDROcodone-acetaminophen (NORCO) 5-325 MG tablet Take 1 tablet by mouth every 6 (six) hours as needed for moderate pain. 05/20/19   Will Bonnet, MD  hydrOXYzine (ATARAX/VISTARIL) 25 MG tablet Take 1 tablet (25 mg total) by mouth 2 (two) times daily as needed for anxiety. 04/27/19   Rod Can, CNM  misoprostol (CYTOTEC) 200 MCG tablet Place 4 tablets (800 mcg total) vaginally at bedtime for 1 day. Place four tablets vaginally, repeat in 6-72 hours as needed 05/20/19 05/21/19  Will Bonnet, MD  ondansetron (ZOFRAN ODT) 4 MG disintegrating tablet Take 1 tablet (4 mg total) by mouth every 6 (six) hours as needed for nausea. 05/20/19   Will Bonnet, MD    Allergies Patient has no known allergies.  Family Hx Family History  Problem Relation Age of Onset  . Insomnia Mother   . Colon cancer Mother 62       colon cancer  . Cervical cancer Maternal Aunt        Cervical Cancer  . Colon cancer Paternal Aunt        Colon cancer  . Asthma Brother   . Diabetes Maternal Grandmother   . Diabetes Maternal Grandfather     Social Hx Social History   Tobacco Use  . Smoking status: Former Smoker     Packs/day: 0.50    Years: 10.00    Pack years: 5.00    Quit date: 06/27/2015    Years since quitting: 3.9  . Smokeless tobacco: Never Used  Substance Use Topics  . Alcohol use: No    Alcohol/week: 0.0 standard drinks  . Drug use: No     Review of Systems  Constitutional: Negative for fever, chills. Eyes: Negative for visual changes. ENT: Negative for sore throat. Cardiovascular: Negative for chest pain. Respiratory: Negative for shortness of breath. Gastrointestinal: Negative for nausea, vomiting.  Genitourinary: + vaginal bleeding, cramping Musculoskeletal: Negative for leg swelling. Skin: Negative for rash. Neurological: Negative for for headaches.   Physical Exam  Vital Signs: ED Triage Vitals  Enc Vitals Group     BP 05/21/19 1221 116/71     Pulse Rate 05/21/19 1221 79     Resp 05/21/19 1221 17     Temp 05/21/19 1226 98 F (36.7 C)     Temp Source 05/21/19 1226 Oral     SpO2 05/21/19 1221 97 %     Weight 05/21/19 1213 236 lb (107 kg)     Height 05/21/19 1213 5\' 4"  (1.626 m)     Head Circumference --      Peak Flow --      Pain Score 05/21/19 1213 7     Pain Loc --      Pain Edu? --      Excl. in [place]? --     Constitutional: Alert and oriented.  Head: Normocephalic. Atraumatic. Eyes: Conjunctivae slightly pale. Sclera anicteric. Nose: No congestion. No rhinorrhea. Mouth/Throat: Wearing mask.  Neck: No stridor.   Cardiovascular: Normal rate, regular rhythm. Extremities well perfused. Respiratory: Normal respiratory effort.  Lungs CTAB. Gastrointestinal: Soft. Non-tender. Non-distended.  Musculoskeletal: No lower extremity edema. No deformities. Neurologic:  Normal speech and language. No gross focal neurologic deficits are appreciated.  Skin: Skin is warm, dry and intact. No rash noted. Psychiatric: Mood and affect are appropriate for situation.   Radiology  [place] ordered, pending   Procedures  Procedure(s) performed (including critical  care):  Procedures   Initial Impression / Assessment and Plan / ED Course  34 y.o. female who presents to the ED for vaginal bleeding after recently taking Cytotec for missed abortion, as above.   Will obtain labs, including T&S, discuss w/ OB.   Labs reveal Hgb 13.6, not requiring transfusion at this time, but did drop to 12.2 over about an hour. Dicussed with OB, who recommend [place]. If still showing retained products, would offer surgical management. [place] ordered, patient care transferred to oncoming provider due to shift change, awaiting [place] results and re-discuss w/ OB. Patient noted to be Rh negative, Rhogam ordered.    Final Clinical Impression(s) / ED Diagnosis  Final diagnoses:  Vaginal bleeding in pregnancy, first trimester       Note:  This document was prepared using Dragon voice recognition software and may include unintentional dictation errors.  Lilia Pro., MD 05/21/19 [phone]

## 2019-05-22 LAB — RHOGAM INJECTION: Unit division: 0

## 2019-05-24 ENCOUNTER — Ambulatory Visit
Admission: RE | Admit: 2019-05-24 | Payer: Medicaid Other | Source: Home / Self Care | Admitting: Obstetrics and Gynecology

## 2019-05-24 ENCOUNTER — Encounter: Admission: RE | Payer: Self-pay | Source: Home / Self Care

## 2019-05-24 SURGERY — DILATION AND EVACUATION, UTERUS
Anesthesia: Choice

## 2019-05-26 ENCOUNTER — Telehealth: Payer: Self-pay

## 2019-05-26 ENCOUNTER — Encounter: Payer: Self-pay | Admitting: Family Medicine

## 2019-05-26 ENCOUNTER — Other Ambulatory Visit: Payer: Self-pay

## 2019-05-26 MED ORDER — GABAPENTIN 100 MG PO CAPS: ORAL_CAPSULE | ORAL | 1 refills | Status: DC

## 2019-05-26 NOTE — Telephone Encounter (Signed)
Patient has contacted office to request scheduling her colonoscopy.  Referral was received last year and scheduled however due to pregnancy, she had to cancel.  She recently had a miscarriage and would like to schedule colonoscopy.  I advised that we will need to obtain medical clearance from her Gynecologists since it was a recent miscarriage, and discuss with Dr. Allen Norris.  Medical Clearance has been sent through Epic to Dr. Prentice Docker.  Fax # [phone].  Thanks,  [place], [place]

## 2019-08-01 ENCOUNTER — Other Ambulatory Visit: Payer: Self-pay | Admitting: Family Medicine

## 2019-08-01 ENCOUNTER — Other Ambulatory Visit: Payer: Self-pay | Admitting: Advanced Practice Midwife

## 2019-08-01 DIAGNOSIS — F419 Anxiety disorder, unspecified: Secondary | ICD-10-CM

## 2019-08-01 MED ORDER — HYDROXYZINE HCL 25 MG PO TABS: 25.0000 mg | ORAL_TABLET | Freq: Two times a day (BID) | ORAL | 2 refills | Status: DC | PRN

## 2019-08-01 NOTE — Telephone Encounter (Signed)
Medication Refill - Medication: hydrOXYzine (ATARAX/VISTARIL) 25 MG tablet   Has the patient contacted their pharmacy? Yes.   (Agent: If no, request that the patient contact the pharmacy for the refill.) (Agent: If yes, when and what did the pharmacy advise?)  Preferred Pharmacy (with phone number or street name):  [id number] - [place], [place] [address]  [address] [place] [address]  Phone: [phone] Fax: [phone]     Agent: Please be advised that RX refills may take up to 3 business days. We ask that you follow-up with your pharmacy.

## 2019-09-02 ENCOUNTER — Ambulatory Visit: Payer: Medicaid Other | Admitting: Internal Medicine

## 2019-09-02 ENCOUNTER — Other Ambulatory Visit: Payer: Self-pay

## 2019-09-02 ENCOUNTER — Encounter: Payer: Self-pay | Admitting: Internal Medicine

## 2019-09-02 VITALS — BP 112/70 | HR 99 | Temp 97.9°F | Resp 16 | Ht 64.0 in | Wt 227.8 lb

## 2019-09-02 DIAGNOSIS — F339 Major depressive disorder, recurrent, unspecified: Secondary | ICD-10-CM

## 2019-09-02 DIAGNOSIS — Z3A01 Less than 8 weeks gestation of pregnancy: Secondary | ICD-10-CM | POA: Diagnosis not present

## 2019-09-02 LAB — POCT URINE PREGNANCY: Preg Test, Ur: POSITIVE — AB

## 2019-09-02 NOTE — Progress Notes (Signed)
Patient ID: Alexandria [name], female    DOB: 1985-11-18, 34 y.o.   MRN: [id number]  PCP: Hubbard Hartshorn, FNP  Chief Complaint  Patient presents with  . Referral    to GYN positive pregnancy test at home    Subjective:   Alexandria [name] is a 34 y.o. female, presents to clinic with CC of the following:  Chief Complaint  Patient presents with  . Referral    to GYN positive pregnancy test at home    HPI:  Patient is a 34 year old female who presents today with request for referral to OB/GYN as she had a positive pregnancy test at home She was seen in the emergency room 05/21/2019 for vaginal bleeding after recently taking Cytotec for missed abortion.  A pelvic ultrasound was normal with no retained POC's. She was to follow-up with OB in a couple days if the bleeding had not stopped or she had other concerns.  She noted she had no problems after, and did return to being sexually active, although only one time and she  noted having a positive pregnancy test done at home.    She has 5 children, during the first 3 pregnancies she noted being on sertraline, and during the last 2 she had switched to Prozac.  She noted she was told to continue these medicines during her pregnancy and had no problems with those 5 pregnancies.  She is currently on Prozac, in addition to BuSpar and hydroxyzine for her depression history. Her depression has been very stable recently, with her PHQ-9 today showing all zeros.  She denies any recent infectious symptoms with no fevers, not feeling ill.  Also denies any recent marked abdominal pains or ankle swelling. She denies any tobacco use or alcohol use presently  Patient Active Problem List   Diagnosis Date Noted  . Supervision of high risk pregnancy, antepartum 04/28/2019  . Oligomenorrhea 01/08/2018  . Thyroid nodule 08/07/2017  . Obesity (BMI 35.0-39.9 without comorbidity) 07/14/2016  . Depression, recurrent ([place]) 07/11/2016  . Neoplasm of uncertain  behavior of skin 05/26/2016  . Verruca vulgaris 10/18/2015  . 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 Anxiety 10/06/2012  . CIN III (cervical intraepithelial neoplasia grade III) with severe dysplasia 08/06/2012      Current Outpatient Medications:  .  busPIRone (BUSPAR) 15 MG tablet, Take 1 tablet (15 mg total) by mouth 3 (three) times daily., Disp: 270 tablet, Rfl: 3 .  ferrous sulfate 325 (65 FE) MG tablet, Take 325 mg by mouth daily with breakfast., Disp: , Rfl:  .  FLUoxetine (PROZAC) 20 MG capsule, TAKE 3 CAPSULES (60 MG TOTAL) BY MOUTH DAILY. (FOR MOOD STOP ESCITALOPRAM), Disp: 90 capsule, Rfl: 1 .  hydrOXYzine (ATARAX/VISTARIL) 25 MG tablet, Take 1 tablet (25 mg total) by mouth 2 (two) times daily as needed for anxiety., Disp: 60 tablet, Rfl: 2 .  gabapentin (NEURONTIN) 100 MG capsule, Take 1 capsule (100 mg total) by mouth at bedtime for 7 days, THEN 1 capsule (100 mg total) 2 (two) times daily for 7 days, THEN 1 capsule (100 mg total) 3 (three) times daily. (Patient not taking: Reported on 09/02/2019), Disp: 270 capsule, Rfl: 1 .  HYDROcodone-acetaminophen (NORCO) 5-325 MG tablet, Take 1 tablet by mouth every 6 (six) hours as needed for moderate pain. (Patient not taking: Reported on 09/02/2019), Disp: 10 tablet, Rfl: 0 .  misoprostol (CYTOTEC) 200 MCG tablet, Place 4 tablets (800 mcg total) vaginally at bedtime for 1 day. Place four  tablets vaginally, repeat in 6-72 hours as needed, Disp: 8 tablet, Rfl: 0 .  ondansetron (ZOFRAN ODT) 4 MG disintegrating tablet, Take 1 tablet (4 mg total) by mouth every 6 (six) hours as needed for nausea. (Patient not taking: Reported on 09/02/2019), Disp: 20 tablet, Rfl: 0   No Known Allergies   Past Surgical History:  Procedure Laterality Date  . 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 CERVICAL BIOPSY  W/ LOOP ELECTRODE EXCISION  2014   [facility]  . COLPOSCOPY  06/15/2012   [facility], Dr Ardell Isaacs: CIN III       Family History  Problem Relation Age of Onset  . Insomnia Mother   . Colon cancer Mother 29       colon cancer  . Cervical cancer Maternal Aunt        Cervical Cancer  . Colon cancer Paternal Aunt        Colon cancer  . Asthma Brother   . Diabetes Maternal Grandmother   . Diabetes Maternal Grandfather      Social History   Tobacco Use  . Smoking status: Former Smoker    Packs/day: 0.50    Years: 10.00    Pack years: 5.00    Quit date: 06/27/2015    Years since quitting: 4.1  . Smokeless tobacco: Never Used  Substance Use Topics  . Alcohol use: No    Alcohol/week: 0.0 standard drinks    With staff assistance, above reviewed with the patient today.  ROS: As per HPI, otherwise no specific complaints on a limited and focused system review   No results found for this or any previous visit (from the past 72 hour(s)).   PHQ2/9: Depression screen [facility] 2/9 09/02/2019 03/10/2019 02/07/2019 10/04/2018 05/20/2018  Decreased Interest 0 1 0 0 0  Down, Depressed, Hopeless 0 0 0 0 0  PHQ - 2 Score 0 1 0 0 0  Altered sleeping 0 1 0 0 0  Tired, decreased energy 0 1 0 0 0  Change in appetite 0 2 0 0 0  Feeling bad or failure about yourself  0 1 0 0 0  Trouble concentrating 0 0 0 0 0  Moving slowly or fidgety/restless 0 0 0 0 0  Suicidal thoughts 0 0 0 0 0  PHQ-9 Score 0 6 0 0 0  Difficult doing work/chores Not difficult at all Somewhat difficult Not difficult at all Not difficult at all Not difficult at all  Some recent data might be hidden   PHQ-2/9 Result is neg  Fall Risk: Fall Risk  09/02/2019 03/10/2019 02/07/2019 10/04/2018 05/20/2018  Falls in the past year? 0 0 0 0 0  Number falls in past yr: 0 0 0 - -  Injury with Fall? 0 0 0 - -  Follow up Falls evaluation completed Falls evaluation completed Falls evaluation completed - -      Objective:   Vitals:   09/02/19 1005  BP: 112/70  Pulse: 99  Resp: 16  Temp: 97.9 F (36.6 C)  TempSrc: Temporal  SpO2: 99%   Weight: 227 lb 12.8 oz (103.3 kg)  Height: 5\' 4"  (1.626 m)    Body mass index is 39.1 kg/m.  Physical Exam   NAD, masked, obese HEENT - [place]/AT, sclera anicteric, conj - non-inj'ed,  Neck - supple,  Car - RRR without m/g/r Pulm- RR and effort normal at rest, CTA without wheeze or rales Ext - no LE edema,  Neuro/psychiatric - affect was not flat, appropriate with conversation  Alert, Speech normal   Results for orders placed or performed during the hospital encounter of 05/21/19  CBC  Result Value Ref Range   WBC 9.2 4.0 - 10.5 K/uL   RBC 4.84 3.87 - 5.11 MIL/uL   Hemoglobin 13.6 12.0 - 15.0 g/dL   HCT 40.9 36.0 - 46.0 %   MCV 84.5 80.0 - 100.0 fL   MCH 28.1 26.0 - 34.0 pg   MCHC 33.3 30.0 - 36.0 g/dL   RDW 12.2 11.5 - 15.5 %   Platelets 294 150 - 400 K/uL   nRBC 0.0 0.0 - 0.2 %  Basic metabolic panel  Result Value Ref Range   Sodium 135 135 - 145 mmol/L   Potassium 3.2 (L) 3.5 - 5.1 mmol/L   Chloride 100 98 - 111 mmol/L   CO2 24 22 - 32 mmol/L   Glucose, Bld 114 (H) 70 - 99 mg/dL   BUN 9 6 - 20 mg/dL   Creatinine, Ser 0.66 0.44 - 1.00 mg/dL   Calcium 9.1 8.9 - 10.3 mg/dL   GFR calc non Af Amer >60 >60 mL/min   GFR calc Af Amer >60 >60 mL/min   Anion gap 11 5 - 15  Hemoglobin and hematocrit, blood  Result Value Ref Range   Hemoglobin 12.2 12.0 - 15.0 g/dL   HCT 36.8 36.0 - 46.0 %  Type and screen [place]  Result Value Ref Range   ABO/RH(D) A NEG    Antibody Screen NEG    Sample Expiration      05/24/2019,2359 Performed at [facility], [address] [place], [place] [address]   Rhogam injection  Result Value Ref Range   Unit Number [id number]    Blood Component Type RHIG    Unit division 00    Status of Unit ISSUED,FINAL    Transfusion Status      OK TO TRANSFUSE Performed at [facility], [address] [place], [place] [address]    A urine pregnancy test in the office today was positive.     Assessment & Plan:   1. Less than [redacted] weeks gestation of pregnancy Referral to OB/GYN given, and she requested to [facility]. - POCT urine pregnancy - Ambulatory referral to Obstetrics / Gynecology  2. Depression, recurrent ([place]) She noted she has been on the SSRIs with previous pregnancies, denied any concerns and was told it was okay for her to continue during that time previously.       Towanda Malkin, MD 09/02/19 10:14 AM

## 2019-09-05 ENCOUNTER — Telehealth: Payer: Self-pay | Admitting: Obstetrics & Gynecology

## 2019-09-05 NOTE — Telephone Encounter (Signed)
[facility] referring for Less than [redacted] weeks gestation of pregnancy. Left generic message for patient to call back to be schedule.

## 2019-09-15 ENCOUNTER — Other Ambulatory Visit (HOSPITAL_COMMUNITY)
Admission: RE | Admit: 2019-09-15 | Discharge: 2019-09-15 | Disposition: A | Discharge status: Home / Self Care | Payer: Medicaid Other | Source: Ambulatory Visit | Attending: Advanced Practice Midwife | Admitting: Advanced Practice Midwife

## 2019-09-15 ENCOUNTER — Encounter: Payer: Self-pay | Admitting: Advanced Practice Midwife

## 2019-09-15 ENCOUNTER — Other Ambulatory Visit: Payer: Self-pay

## 2019-09-15 ENCOUNTER — Ambulatory Visit (INDEPENDENT_AMBULATORY_CARE_PROVIDER_SITE_OTHER): Payer: Medicaid Other | Admitting: Advanced Practice Midwife

## 2019-09-15 VITALS — BP 121/78 | Wt 231.0 lb

## 2019-09-15 DIAGNOSIS — Z3481 Encounter for supervision of other normal pregnancy, first trimester: Secondary | ICD-10-CM

## 2019-09-15 DIAGNOSIS — Z113 Encounter for screening for infections with a predominantly sexual mode of transmission: Secondary | ICD-10-CM

## 2019-09-15 DIAGNOSIS — Z3480 Encounter for supervision of other normal pregnancy, unspecified trimester: Secondary | ICD-10-CM | POA: Insufficient documentation

## 2019-09-15 DIAGNOSIS — N898 Other specified noninflammatory disorders of vagina: Secondary | ICD-10-CM

## 2019-09-15 DIAGNOSIS — O99211 Obesity complicating pregnancy, first trimester: Secondary | ICD-10-CM | POA: Insufficient documentation

## 2019-09-15 DIAGNOSIS — Z3A01 Less than 8 weeks gestation of pregnancy: Secondary | ICD-10-CM

## 2019-09-15 DIAGNOSIS — Z131 Encounter for screening for diabetes mellitus: Secondary | ICD-10-CM

## 2019-09-15 NOTE — Patient Instructions (Signed)
Exercise During Pregnancy Exercise is an important part of being healthy for people of all ages. Exercise improves the function of your heart and lungs and helps you maintain strength, flexibility, and a healthy body weight. Exercise also boosts energy levels and elevates mood. Most women should exercise regularly during pregnancy. In rare cases, women with certain medical conditions or complications may be asked to limit or avoid exercise during pregnancy. How does this affect me? Along with maintaining general strength and flexibility, exercising during pregnancy can help:  Keep strength in muscles that are used during labor and childbirth.  Decrease low back pain.  Reduce symptoms of depression.  Control weight gain during pregnancy.  Reduce the risk of needing insulin if you develop diabetes during pregnancy.  Decrease the risk of cesarean delivery.  Speed up your recovery after giving birth. How does this affect my baby? Exercise can help you have a healthy pregnancy. Exercise does not cause premature birth. It will not cause your baby to weigh less at birth. What exercises can I do? Many exercises are safe for you to do during pregnancy. Do a variety of exercises that safely increase your heart and breathing rates and help you build and maintain muscle strength. Do exercises exactly as told by your health care provider. You may do these exercises:  Walking or hiking.  Swimming.  Water aerobics.  Riding a stationary bike.  Strength training.  Modified yoga or Pilates. Tell your instructor that you are pregnant. Avoid overstretching, and avoid lying on your back for long periods of time.  Running or jogging. Only choose this type of exercise if you: ? Ran or jogged regularly before your pregnancy. ? Can run or jog and still talk in complete sentences. What exercises should I avoid? Depending on your level of fitness and whether you exercised regularly before your  pregnancy, you may be told to limit high-intensity exercise. You can tell that you are exercising at a high intensity if you are breathing much harder and faster and cannot hold a conversation while exercising. You must avoid:  Contact sports.  Activities that put you at risk for falling on or being hit in the belly, such as downhill skiing, water skiing, surfing, rock climbing, cycling, gymnastics, and horseback riding.  Scuba diving.  Skydiving.  Yoga or Pilates in a room that is heated to high temperatures.  Jogging or running, unless you ran or jogged regularly before your pregnancy. While jogging or running, you should always be able to talk in full sentences. Do not run or jog so fast that you are unable to have a conversation.  Do not exercise at more than 6,000 feet above sea level (high elevation) if you are not used to exercising at high elevation. How do I exercise in a safe way?   Avoid overheating. Do not exercise in very high temperatures.  Wear loose-fitting, breathable clothes.  Avoid dehydration. Drink enough water before, during, and after exercise to keep your urine pale yellow.  Avoid overstretching. Because of hormone changes during pregnancy, it is easy to overstretch muscles, tendons, and ligaments during pregnancy.  Start slowly and ask your health care provider to recommend the types of exercise that are safe for you.  Do not exercise to lose weight. Follow these instructions at home:  Exercise on most days or all days of the week. Try to exercise for 30 minutes a day, 5 days a week, unless your health care provider tells you not to.  If  you actively exercised before your pregnancy and you are healthy, your health care provider may tell you to continue to do moderate to high-intensity exercise.  If you are just starting to exercise or did not exercise much before your pregnancy, your health care provider may tell you to do low to moderate-intensity  exercise. Questions to ask your health care provider  Is exercise safe for me?  What are signs that I should stop exercising?  Does my health condition mean that I should not exercise during pregnancy?  When should I avoid exercising during pregnancy? Stop exercising and contact a health care provider if: You have any unusual symptoms, such as:  Mild contractions of the uterus or cramps in the abdomen.  Dizziness that does not go away when you rest. Stop exercising and get help right away if: You have any unusual symptoms, such as:  Sudden, severe pain in your low back or your belly.  Mild contractions of the uterus or cramps in the abdomen that do not improve with rest and drinking fluids.  Chest pain.  Bleeding or fluid leaking from your vagina.  Shortness of breath. These symptoms may represent a serious problem that is an emergency. Do not wait to see if the symptoms will go away. Get medical help right away. Call your local emergency services (911 in the [place] Do not drive yourself to the hospital. Summary  Most women should exercise regularly throughout pregnancy. In rare cases, women with certain medical conditions or complications may be asked to limit or avoid exercise during pregnancy.  Do not exercise to lose weight during pregnancy.  Your health care provider will tell you what level of physical activity is right for you.  Stop exercising and contact a health care provider if you have mild contractions of the uterus or cramps in the abdomen. Get help right away if these contractions or cramps do not improve with rest and drinking fluids.  Stop exercising and get help right away if you have sudden, severe pain in your low back or belly, chest pain, shortness of breath, or bleeding or leaking of fluid from your vagina. This information is not intended to replace advice given to you by your health care provider. Make sure you discuss any questions you have with your  health care provider. Document Revised: 08/05/2018 Document Reviewed: 05/19/2018 Elsevier Patient Education  2020 [employer] Eating Plan for Pregnant Women While you are pregnant, your body requires additional nutrition to help support your growing baby. You also have a higher need for some vitamins and minerals, such as folic acid, calcium, iron, and vitamin D. Eating a healthy, well-balanced diet is very important for your health and your baby's health. Your need for extra calories varies for the three 3-month segments of your pregnancy (trimesters). For most women, it is recommended to consume:  150 extra calories a day during the first trimester.  300 extra calories a day during the second trimester.  300 extra calories a day during the third trimester. What are tips for following this plan?   Do not try to lose weight or go on a diet during pregnancy.  Limit your overall intake of foods that have "empty calories." These are foods that have little nutritional value, such as sweets, desserts, candies, and sugar-sweetened beverages.  Eat a variety of foods (especially fruits and vegetables) to get a full range of vitamins and minerals.  Take a prenatal vitamin to help meet your additional vitamin and mineral needs   during pregnancy, specifically for folic acid, iron, calcium, and vitamin D.  Remember to stay active. Ask your health care provider what types of exercise and activities are safe for you.  Practice good food safety and cleanliness. Wash your hands before you eat and after you prepare raw meat. Wash all fruits and vegetables well before peeling or eating. Taking these actions can help to prevent food-borne illnesses that can be very dangerous to your baby, such as listeriosis. Ask your health care provider for more information about listeriosis. What does 150 extra calories look like? Healthy options that provide 150 extra calories each day could be any of the  following:  6-8 oz (170-230 g) of plain low-fat yogurt with  cup of berries.  1 apple with 2 teaspoons (11 g) of peanut butter.  Cut-up vegetables with  cup (60 g) of hummus.  8 oz (230 mL) or 1 cup of low-fat chocolate milk.  1 stick of string cheese with 1 medium orange.  1 peanut butter and jelly sandwich that is made with one slice of whole-wheat bread and 1 tsp (5 g) of peanut butter. For 300 extra calories, you could eat two of those healthy options each day. What is a healthy amount of weight to gain? The right amount of weight gain for you is based on your BMI before you became pregnant. If your BMI:  Was less than 18 (underweight), you should gain 28-40 lb (13-18 kg).  Was 18-24.9 (normal), you should gain 25-35 lb (11-16 kg).  Was 25-29.9 (overweight), you should gain 15-25 lb (7-11 kg).  Was 30 or greater (obese), you should gain 11-20 lb (5-9 kg). What if I am having twins or multiples? Generally, if you are carrying twins or multiples:  You may need to eat 300-600 extra calories a day.  The recommended range for total weight gain is 25-54 lb (11-25 kg), depending on your BMI before pregnancy.  Talk with your health care provider to find out about nutritional needs, weight gain, and exercise that is right for you. What foods can I eat?  Fruits All fruits. Eat a variety of colors and types of fruit. Remember to wash your fruits well before peeling or eating. Vegetables All vegetables. Eat a variety of colors and types of vegetables. Remember to wash your vegetables well before peeling or eating. Grains All grains. Choose whole grains, such as whole-wheat bread, oatmeal, or brown rice. Meats and other protein foods Lean meats, including chicken, turkey, fish, and lean cuts of beef, veal, or pork. If you eat fish or seafood, choose options that are higher in omega-3 fatty acids and lower in mercury, such as salmon, herring, mussels, trout, sardines, pollock,  shrimp, crab, and lobster. Tofu. Tempeh. Beans. Eggs. Peanut butter and other nut butters. Make sure that all meats, poultry, and eggs are cooked to food-safe temperatures or "well-done." Two or more servings of fish are recommended each week in order to get the most benefits from omega-3 fatty acids that are found in seafood. Choose fish that are lower in mercury. You can find more information online:  www.fda.gov Dairy Pasteurized milk and milk alternatives (such as almond milk). Pasteurized yogurt and pasteurized cheese. Cottage cheese. Sour cream. Beverages Water. Juices that contain 100% fruit juice or vegetable juice. Caffeine-free teas and decaffeinated coffee. Drinks that contain caffeine are okay to drink, but it is better to avoid caffeine. Keep your total caffeine intake to less than 200 mg each day (which is 12 oz   or 355 mL of coffee, tea, or soda) or the limit as told by your health care provider. Fats and oils Fats and oils are okay to include in moderation. Sweets and desserts Sweets and desserts are okay to include in moderation. Seasoning and other foods All pasteurized condiments. The items listed above may not be a complete list of foods and beverages you can eat. Contact a dietitian for more information. What foods are not recommended? Fruits Unpasteurized fruit juices. Vegetables Raw (unpasteurized) vegetable juices. Meats and other protein foods Lunch meats, bologna, hot dogs, or other deli meats. (If you must eat those meats, reheat them until they are steaming hot.) Refrigerated pat, meat spreads from a meat counter, smoked seafood that is found in the refrigerated section of a store. Raw or undercooked meats, poultry, and eggs. Raw fish, such as sushi or sashimi. Fish that have high mercury content, such as tilefish, shark, swordfish, and king mackerel. To learn more about mercury in fish, talk with your health care provider or look for online resources, such  as:  www.fda.gov Dairy Raw (unpasteurized) milk and any foods that have raw milk in them. Soft cheeses, such as feta, queso blanco, queso fresco, Brie, Camembert cheeses, blue-veined cheeses, and Panela cheese (unless it is made with pasteurized milk, which must be stated on the label). Beverages Alcohol. Sugar-sweetened beverages, such as sodas, teas, or energy drinks. Seasoning and other foods Homemade fermented foods and drinks, such as pickles, sauerkraut, or kombucha drinks. (Store-bought pasteurized versions of these are okay.) Salads that are made in a store or deli, such as ham salad, chicken salad, egg salad, tuna salad, and seafood salad. The items listed above may not be a complete list of foods and beverages you should avoid. Contact a dietitian for more information. Where to find more information To calculate the number of calories you need based on your height, weight, and activity level, you can use an online calculator such as:  www.choosemyplate.gov/MyPlatePlan To calculate how much weight you should gain during pregnancy, you can use an online pregnancy weight gain calculator such as:  www.choosemyplate.gov/pregnancy-weight-gain-calculator Summary  While you are pregnant, your body requires additional nutrition to help support your growing baby.  Eat a variety of foods, especially fruits and vegetables to get a full range of vitamins and minerals.  Practice good food safety and cleanliness. Wash your hands before you eat and after you prepare raw meat. Wash all fruits and vegetables well before peeling or eating. Taking these actions can help to prevent food-borne illnesses, such as listeriosis, that can be very dangerous to your baby.  Do not eat raw meat or fish. Do not eat fish that have high mercury content, such as tilefish, shark, swordfish, and king mackerel. Do not eat unpasteurized (raw) dairy.  Take a prenatal vitamin to help meet your additional vitamin and  mineral needs during pregnancy, specifically for folic acid, iron, calcium, and vitamin D. This information is not intended to replace advice given to you by your health care provider. Make sure you discuss any questions you have with your health care provider. Document Revised: 09/02/2018 Document Reviewed: 01/09/2017 Elsevier Patient Education  2020 [employer]. Prenatal Care Prenatal care is health care during pregnancy. It helps you and your unborn baby (fetus) stay as healthy as possible. Prenatal care may be provided by a midwife, a family practice health care provider, or a childbirth and pregnancy specialist (obstetrician). How does this affect me? During pregnancy, you will be closely monitored   for any new conditions that might develop. To lower your risk of pregnancy complications, you and your health care provider will talk about any underlying conditions you have. How does this affect my baby? Early and consistent prenatal care increases the chance that your baby will be healthy during pregnancy. Prenatal care lowers the risk that your baby will be:  Born early (prematurely).  Smaller than expected at birth (small for gestational age). What can I expect at the first prenatal care visit? Your first prenatal care visit will likely be the longest. You should schedule your first prenatal care visit as soon as you know that you are pregnant. Your first visit is a good time to talk about any questions or concerns you have about pregnancy. At your visit, you and your health care provider will talk about:  Your medical history, including: ? Any past pregnancies. ? Your family's medical history. ? The baby's father's medical history. ? Any long-term (chronic) health conditions you have and how you manage them. ? Any surgeries or procedures you have had. ? Any current over-the-counter or prescription medicines, herbs, or supplements you are taking.  Other factors that could pose a risk  to your baby, including:  Your home setting and your stress levels, including: ? Exposure to abuse or violence. ? Household financial strain. ? Mental health conditions you have.  Your daily health habits, including diet and exercise. Your health care provider will also:  Measure your weight, height, and blood pressure.  Do a physical exam, including a pelvic and breast exam.  Perform blood tests and urine tests to check for: ? Urinary tract infection. ? Sexually transmitted infections (STIs). ? Low iron levels in your blood (anemia). ? Blood type and certain proteins on red blood cells (Rh antibodies). ? Infections and immunity to viruses, such as hepatitis B and rubella. ? HIV (human immunodeficiency virus).  Do an ultrasound to confirm your baby's growth and development and to help predict your estimated due date (EDD). This ultrasound is done with a probe that is inserted into the vagina (transvaginal ultrasound).  Discuss your options for genetic screening.  Give you information about how to keep yourself and your baby healthy, including: ? Nutrition and taking vitamins. ? Physical activity. ? How to manage pregnancy symptoms such as nausea and vomiting (morning sickness). ? Infections and substances that may be harmful to your baby and how to avoid them. ? Food safety. ? Dental care. ? Working. ? Travel. ? Warning signs to watch for and when to call your health care provider. How often will I have prenatal care visits? After your first prenatal care visit, you will have regular visits throughout your pregnancy. The visit schedule is often as follows:  Up to week 28 of pregnancy: once every 4 weeks.  28-36 weeks: once every 2 weeks.  After 36 weeks: every week until delivery. Some women may have visits more or less often depending on any underlying health conditions and the health of the baby. Keep all follow-up and prenatal care visits as told by your health care  provider. This is important. What happens during routine prenatal care visits? Your health care provider will:  Measure your weight and blood pressure.  Check for fetal heart sounds.  Measure the height of your uterus in your abdomen (fundal height). This may be measured starting around week 20 of pregnancy.  Check the position of your baby inside your uterus.  Ask questions about your diet, sleeping patterns, and   whether you can feel the baby move.  Review warning signs to watch for and signs of labor.  Ask about any pregnancy symptoms you are having and how you are dealing with them. Symptoms may include: ? Headaches. ? Nausea and vomiting. ? Vaginal discharge. ? Swelling. ? Fatigue. ? Constipation. ? Any discomfort, including back or pelvic pain. Make a list of questions to ask your health care provider at your routine visits. What tests might I have during prenatal care visits? You may have blood, urine, and imaging tests throughout your pregnancy, such as:  Urine tests to check for glucose, protein, or signs of infection.  Glucose tests to check for a form of diabetes that can develop during pregnancy (gestational diabetes mellitus). This is usually done around week 24 of pregnancy.  An ultrasound to check your baby's growth and development and to check for birth defects. This is usually done around week 20 of pregnancy.  A test to check for group B strep (GBS) infection. This is usually done around week 36 of pregnancy.  Genetic testing. This may include blood or imaging tests, such as an ultrasound. Some genetic tests are done during the first trimester and some are done during the second trimester. What else can I expect during prenatal care visits? Your health care provider may recommend getting certain vaccines during pregnancy. These may include:  A yearly flu shot (annual influenza vaccine). This is especially important if you will be pregnant during flu  season.  Tdap (tetanus, diphtheria, pertussis) vaccine. Getting this vaccine during pregnancy can protect your baby from whooping cough (pertussis) after birth. This vaccine may be recommended between weeks 27 and 36 of pregnancy. Later in your pregnancy, your health care provider may give you information about:  Childbirth and breastfeeding classes.  Choosing a health care provider for your baby.  Umbilical cord banking.  Breastfeeding.  Birth control after your baby is born.  The hospital labor and delivery unit and how to tour it.  Registering at the hospital before you go into labor. Where to find more information  Office on Women's Health: womenshealth.gov  American Pregnancy Association: americanpregnancy.org  March of Dimes: marchofdimes.org Summary  Prenatal care helps you and your baby stay as healthy as possible during pregnancy.  Your first prenatal care visit will most likely be the longest.  You will have visits and tests throughout your pregnancy to monitor your health and your baby's health.  Bring a list of questions to your visits to ask your health care provider.  Make sure to keep all follow-up and prenatal care visits with your health care provider. This information is not intended to replace advice given to you by your health care provider. Make sure you discuss any questions you have with your health care provider. Document Revised: 08/04/2018 Document Reviewed: 04/13/2017 Elsevier Patient Education  2020 [employer]  

## 2019-09-15 NOTE — Progress Notes (Signed)
NOB today.  

## 2019-09-15 NOTE — Progress Notes (Signed)
New Obstetric Patient H&P    Chief Complaint: "Desires prenatal care"   History of Present Illness: Patient is a 34 y.o. [id number] Not Hispanic or Latino female, presents with amenorrhea and positive home pregnancy test. Patient's last menstrual period was 07/28/2019 (exact date). and based on her  LMP, her EDD is Estimated Date of Delivery: 05/03/20 and her EGA is [redacted]w[redacted]d. Cycles are 4. days, regular, and occur approximately every : 28 days. Her last pap smear was 1 years ago and was no abnormalities.    She had a urine pregnancy test which was positive 1 or 2 week(s)  ago. Her last menstrual period was normal and lasted for  4 or 5 day(s). Since her LMP she claims she has experienced breast tenderness, fatigue, nausea. She had a spot of bleeding with wiping 2 days ago. Her past medical history is contributory for benign thyroid nodules. Her prior pregnancies are notable for G1 2008 FT SVD M, G2 May 2010 FT SVD M, [place] TAB 2013, G4 November 2014 FT SVD Thurman Coyer October 2016 FT SVD M, Earnestine Mealing December 2018 FT SVD M, G7 SAB, G8 current. All babies in 7-8 pound range. No HTN or GDM.  Since her LMP, she admits to the use of tobacco products  No- quit 3.5 years ago She claims she has gained 4 pounds since the start of her pregnancy.  There are cats in the home in the home  no  She admits close contact with children on a regular basis  yes  She has had chicken pox in the past yes She has had Tuberculosis exposures, symptoms, or previously tested positive for TB   no Current or past history of domestic violence. no  Genetic Screening/Teratology Counseling: (Includes patient, baby's father, or anyone in either family with:)   25. Patient's age >/= 40 [facility]  no 2. Thalassemia ([place], [place], [place], or Asian background): MCV<80  no 3. Neural tube defect (meningomyelocele, spina bifida, anencephaly)  no 4. Congenital heart defect  no  5. Down syndrome  no 6. Tay-Sachs (Jewish, [place])  no 7.  Canavan's Disease  no 8. Sickle cell disease or trait (African)  no  9. Hemophilia or other blood disorders  no  10. Muscular dystrophy  no  11. Cystic fibrosis  no  12. Huntington's Chorea  no  13. Mental retardation/autism  no 14. Other inherited genetic or chromosomal disorder  no 15. Maternal metabolic disorder (DM, PKU, etc)  no 16. Patient or FOB with a child with a birth defect not listed above no  16a. Patient or FOB with a birth defect themselves no 17. Recurrent pregnancy loss, or stillbirth  no  18. Any medications since LMP other than prenatal vitamins (include vitamins, supplements, OTC meds, drugs, alcohol)  Buspirone, Fluoxetine, Hydroxyzine 19. Any other genetic/environmental exposure to discuss  no  Infection History:   1. Lives with someone with TB or TB exposed  no  2. Patient or partner has history of genital herpes  no 3. Rash or viral illness since LMP  no 4. History of STI (GC, CT, HPV, syphilis, HIV)  Chlamydia in 2008 treated 5. History of recent travel :  no  Other pertinent information:  She mentions on and off again vaginal itching with minor relief from OTC Monistat. She denies discharge, odor or irritation. We discussed yeast being opportunistic including sugar consumption allowing yeast overgrowth. We will wait to prescribe Terconazole until lab results. We also discussed BMI at 39.  She is encouraged to decrease sugar intake (she admits drinking soda daily) and to increase physical activity to avoid weight gain.     Review of Systems:10 point review of systems negative unless otherwise noted in HPI  Past Medical History:  Patient Active Problem List   Diagnosis Date Noted  . Supervision of normal pregnancy 09/15/2019    Clinic [facility] Prenatal Labs  Dating  Blood type: --/--/A NEG (01/23 1216)   Genetic Screen 1 Screen:    AFP:     Quad:     NIPS: Antibody:NEG (01/23 1216)  Anatomic [place]  Rubella:   Varicella: @VZVIGG @  GTT Early:                  Third trimester:  RPR:     Rhogam  HBsAg:     Vaccines TDAP:                       Flu Shot: HIV:     Baby Food                                GBS:   Contraception  Pap: 2020 negative  CBB     CS/VBAC NA   Support Person Freida Busman       . Obesity affecting pregnancy in first trimester 09/15/2019  . Thyroid nodule 08/07/2017  . Obesity (BMI 35.0-39.9 without comorbidity) 07/14/2016  . Depression, recurrent ([place]) 07/11/2016  . Neoplasm of uncertain behavior of skin 05/26/2016  . Verruca vulgaris 10/18/2015  . 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 Anxiety 10/06/2012    Per patient she is using Neurontin and Buspar throughout her previous pregnancy as well with no complications and she desires to continue her medications throughout this current pregnancy too.   . CIN III (cervical intraepithelial neoplasia grade III) with severe dysplasia 08/06/2012    ASCUS pap +HPV in 03/2012  followed by LGSIL on pap 06/15/2012 with CIN 3 on colpo that same day.  Patient wishes to delay treatment until postpartum. Other option discussed is repeat pap w/colpo q12 weeks to monitor progression 11/09/12 Pap LGSIL       Past Surgical History:  Past Surgical History:  Procedure Laterality Date  . 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 CERVICAL BIOPSY  W/ LOOP ELECTRODE EXCISION  2014   [facility]  . COLPOSCOPY  06/15/2012   [facility], Dr Ardell Isaacs: CIN III    Gynecologic History: Patient's last menstrual period was 07/28/2019 (exact date).  Obstetric History: [id number]  Family History:  Family History  Problem Relation Age of Onset  . Insomnia Mother   . Colon cancer Mother 3       colon cancer  . Cervical cancer Maternal Aunt        Cervical Cancer  . Colon cancer Paternal Aunt        Colon cancer  . Asthma Brother   . Diabetes Maternal Grandmother   . Diabetes Maternal Grandfather     Social History:  Social History   Socioeconomic  History  . Marital status: Divorced    Spouse name: Freida Busman  . Number of children: 5  . Years of education: Not on file  . Highest education level: Associate degree: occupational, Hotel manager, or vocational program  Occupational History  . Occupation: unemployed  Tobacco Use  . Smoking status: Former Smoker    Packs/day: 0.50  Years: 10.00    Pack years: 5.00    Quit date: 06/27/2015    Years since quitting: 4.2  . Smokeless tobacco: Never Used  Substance and Sexual Activity  . Alcohol use: No    Alcohol/week: 0.0 standard drinks  . Drug use: No  . Sexual activity: Yes    Partners: Male    Birth control/protection: None  Other Topics Concern  . Not on file  Social History Narrative  . Not on file   Social Determinants of Health   Financial Resource Strain:   . Difficulty of Paying Living Expenses:   Food Insecurity:   . Worried About Charity fundraiser in the Last Year:   . Arboriculturist in the Last Year:   Transportation Needs:   . Film/video editor (Medical):   Marland Kitchen Lack of Transportation (Non-Medical):   Physical Activity: Unknown  . Days of Exercise per Week: 0 days  . Minutes of Exercise per Session: Not asked  Stress: Stress Concern Present  . Feeling of Stress : To some extent  Social Connections: Unknown  . Frequency of Communication with Friends and Family: Not on file  . Frequency of Social Gatherings with Friends and Family: Never  . Attends Religious Services: Not on file  . Active Member of Clubs or Organizations: Not on file  . Attends Archivist Meetings: Not on file  . Marital Status: Not on file  Intimate Partner Violence:   . Fear of Current or Ex-Partner:   . Emotionally Abused:   Marland Kitchen Physically Abused:   . Sexually Abused:     Allergies:  No Known Allergies  Medications: Prior to Admission medications   Medication Sig Start Date End Date Taking? Authorizing Provider  busPIRone (BUSPAR) 15 MG tablet Take 1 tablet (15 mg total)  by mouth 3 (three) times daily. 10/04/18  Yes Poulose, Bethel Born, NP  ferrous sulfate 325 (65 FE) MG tablet Take 325 mg by mouth daily with breakfast.   Yes [provider]  FLUoxetine (PROZAC) 20 MG capsule TAKE 3 CAPSULES (60 MG TOTAL) BY MOUTH DAILY. (FOR MOOD STOP ESCITALOPRAM) 08/01/19  Yes Hubbard Hartshorn, FNP  hydrOXYzine (ATARAX/VISTARIL) 25 MG tablet Take 1 tablet (25 mg total) by mouth 2 (two) times daily as needed for anxiety. 08/01/19  Yes Hubbard Hartshorn, FNP  gabapentin (NEURONTIN) 100 MG capsule Take 1 capsule (100 mg total) by mouth at bedtime for 7 days, THEN 1 capsule (100 mg total) 2 (two) times daily for 7 days, THEN 1 capsule (100 mg total) 3 (three) times daily. Patient not taking: Reported on 09/02/2019 05/26/19 09/07/19  Hubbard Hartshorn, FNP  HYDROcodone-acetaminophen (NORCO) 5-325 MG tablet Take 1 tablet by mouth every 6 (six) hours as needed for moderate pain. Patient not taking: Reported on 09/02/2019 05/20/19   Will Bonnet, MD  misoprostol (CYTOTEC) 200 MCG tablet Place 4 tablets (800 mcg total) vaginally at bedtime for 1 day. Place four tablets vaginally, repeat in 6-72 hours as needed 05/20/19 05/21/19  Will Bonnet, MD  ondansetron (ZOFRAN ODT) 4 MG disintegrating tablet Take 1 tablet (4 mg total) by mouth every 6 (six) hours as needed for nausea. Patient not taking: Reported on 09/02/2019 05/20/19   Will Bonnet, MD    Physical Exam Vitals: Blood pressure 121/78, weight 231 lb (104.8 kg), last menstrual period 07/28/2019  General: NAD HEENT: normocephalic, anicteric Thyroid: no enlargement, no palpable nodules Pulmonary: No increased work of breathing,  CTAB Cardiovascular: RRR, distal pulses 2+ Abdomen: NABS, soft, non-tender, non-distended.  Umbilicus without lesions.  No hepatomegaly, splenomegaly or masses palpable. No evidence of hernia  Genitourinary:  External: Normal external female genitalia.  Normal urethral meatus, normal  Bartholin's and  Skene's glands.    Vagina: Normal vaginal mucosa, no evidence of prolapse.    Cervix: not visualized, no bleeding, no CMT  Uterus:  Non-enlarged, mobile, normal contour.    Adnexa: ovaries non-enlarged, no adnexal masses  Rectal: deferred Extremities: no edema, erythema, or tenderness Neurologic: Grossly intact Psychiatric: mood appropriate, affect full   The following were addressed during this visit:  Breastfeeding Education - Individualized Education    Comments: Contraindications to breastfeeding and other special medical conditions Patient is an experienced breast feeder and understands benefits, techniques, etc She plans to breastfeed   Assessment: 34 y.o. [id number] at [redacted]w[redacted]d by LMP presenting to initiate prenatal care  Plan: 1) Avoid alcoholic beverages. 2) Patient encouraged not to smoke.  3) Discontinue the use of all non-medicinal drugs and chemicals.  4) Take prenatal vitamins daily.  5) Nutrition, food safety (fish, cheese advisories, and high nitrite foods) and exercise discussed. 6) Hospital and practice style discussed with cross coverage system.  7) Genetic Screening, such as with 1st Trimester Screening, cell free fetal DNA, AFP testing, and Ultrasound, as well as with amniocentesis and CVS as appropriate, is discussed with patient. At the conclusion of today's visit patient requested cell free DNA genetic testing 8) Patient is asked about travel to areas at risk for the Zika virus, and counseled to avoid travel and exposure to mosquitoes or sexual partners who may have themselves been exposed to the virus. Testing is discussed, and will be ordered as appropriate.  9) Aptima, urine culture today: follow up as needed for yeast infection 10) Return to clinic in 1 week for early 1 hr gtt, NOB panel, dating scan and rob 39) MaterniT 21 at 10+ weeks   Rod Can, [place] 09/15/2019, 2:26 PM

## 2019-09-17 LAB — URINE CULTURE: Organism ID, Bacteria: NO GROWTH

## 2019-09-19 LAB — CERVICOVAGINAL ANCILLARY ONLY
Bacterial Vaginitis (gardnerella): NEGATIVE
Candida Glabrata: NEGATIVE
Candida Vaginitis: NEGATIVE
Chlamydia: NEGATIVE
Comment: NEGATIVE
Comment: NEGATIVE
Comment: NEGATIVE
Comment: NEGATIVE
Comment: NEGATIVE
Comment: NORMAL
Neisseria Gonorrhea: NEGATIVE
Trichomonas: NEGATIVE

## 2019-09-20 NOTE — Progress Notes (Deleted)
Office Visit Note  Patient: Alexandria [name]             Date of Birth: 04-Sep-1985           MRN: [id number]             PCP: Hubbard Hartshorn, FNP Referring: Hubbard Hartshorn, FNP Visit Date: 10/04/2019 Occupation: '@GUAROCC'$ @  Subjective:  No chief complaint on file.   History of Present Illness: Alexandria [name] is a 34 y.o. female ***   Activities of Daily Living:  Patient reports morning stiffness for *** {minute/hour:19697}.   Patient {ACTIONS;DENIES/REPORTS:21021675::"Denies"} nocturnal pain.  Difficulty dressing/grooming: {ACTIONS;DENIES/REPORTS:21021675::"Denies"} Difficulty climbing stairs: {ACTIONS;DENIES/REPORTS:21021675::"Denies"} Difficulty getting out of chair: {ACTIONS;DENIES/REPORTS:21021675::"Denies"} Difficulty using hands for taps, buttons, cutlery, and/or writing: {ACTIONS;DENIES/REPORTS:21021675::"Denies"}  No Rheumatology ROS completed.   PMFS History:  Patient Active Problem List   Diagnosis Date Noted  . Supervision of normal pregnancy 09/15/2019  . Obesity affecting pregnancy in first trimester 09/15/2019  . Thyroid nodule 08/07/2017  . Obesity (BMI 35.0-39.9 without comorbidity) 07/14/2016  . Depression, recurrent ([place]) 07/11/2016  . Neoplasm of uncertain behavior of skin 05/26/2016  . Verruca vulgaris 10/18/2015  . 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 Anxiety 10/06/2012  . CIN III (cervical intraepithelial neoplasia grade III) with severe dysplasia 08/06/2012    Past Medical History:  Diagnosis Date  . Anxiety   . CIN III (cervical intraepithelial neoplasia III)    2011 or 2012-LEEP  . Depression   . High risk HPV infection    followed by GYN  . Multinodular goiter     Family History  Problem Relation Age of Onset  . Insomnia Mother   . Colon cancer Mother 40       colon cancer  . Cervical cancer Maternal Aunt        Cervical Cancer  . Colon cancer Paternal Aunt        Colon cancer  . Asthma Brother   . Diabetes Maternal  Grandmother   . Diabetes Maternal Grandfather    Past Surgical History:  Procedure Laterality Date  . 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 CERVICAL BIOPSY  W/ LOOP ELECTRODE EXCISION  2014   [facility]  . COLPOSCOPY  06/15/2012   [facility], Dr Ardell Isaacs: CIN III   Social History   Social History Narrative  . Not on file   Immunization History  Administered Date(s) Administered  . Influenza,inj,Quad PF,6+ Mos 03/03/2017  . MMR 03/24/2013  . Rho (D) Immune Globulin 03/24/2013  . Tdap 03/03/2017     Objective: Vital Signs: LMP 07/28/2019 (Exact Date) Comment: recent miscarriage in February   Physical Exam   Musculoskeletal Exam: ***  CDAI Exam: CDAI Score: -- Patient Global: --; Provider Global: -- Swollen: --; Tender: -- Joint Exam 10/04/2019   No joint exam has been documented for this visit   There is currently no information documented on the homunculus. Go to the Rheumatology activity and complete the homunculus joint exam.  Investigation: No additional findings.  Imaging: No results found.  Recent Labs: Lab Results  Component Value Date   WBC 9.2 05/21/2019   HGB 12.2 05/21/2019   PLT 294 05/21/2019   NA 135 05/21/2019   K 3.2 (L) 05/21/2019   CL 100 05/21/2019   CO2 24 05/21/2019   GLUCOSE 114 (H) 05/21/2019   BUN 9 05/21/2019   CREATININE 0.66 05/21/2019   BILITOT 0.2 03/10/2019   ALKPHOS 60 11/27/2016  AST 15 03/10/2019   ALT 19 03/10/2019   PROT 7.0 03/10/2019   ALBUMIN 3.4 (L) 11/27/2016   CALCIUM 9.1 05/21/2019   GFRAA >60 05/21/2019    Speciality Comments: No specialty comments available.  Procedures:  No procedures performed Allergies: Patient has no known allergies.   Assessment / Plan:     Visit Diagnoses: No diagnosis found.  Orders: No orders of the defined types were placed in this encounter.  No orders of the defined types were placed in this encounter.   Face-to-face time  spent with patient was *** minutes. Greater than 50% of time was spent in counseling and coordination of care.  Follow-Up Instructions: No follow-ups on file.   Ofilia Neas, PA-C  Note - This record has been created using Dragon software.  Chart creation errors have been sought, but may not always  have been located. Such creation errors do not reflect on  the standard of medical care.

## 2019-09-21 ENCOUNTER — Ambulatory Visit (INDEPENDENT_AMBULATORY_CARE_PROVIDER_SITE_OTHER): Payer: Medicaid Other | Admitting: Obstetrics and Gynecology

## 2019-09-21 ENCOUNTER — Ambulatory Visit (INDEPENDENT_AMBULATORY_CARE_PROVIDER_SITE_OTHER): Payer: Medicaid Other

## 2019-09-21 ENCOUNTER — Other Ambulatory Visit: Payer: Medicaid Other

## 2019-09-21 ENCOUNTER — Other Ambulatory Visit: Payer: Self-pay

## 2019-09-21 ENCOUNTER — Encounter: Payer: Self-pay | Admitting: Obstetrics and Gynecology

## 2019-09-21 ENCOUNTER — Other Ambulatory Visit: Payer: Self-pay | Admitting: Advanced Practice Midwife

## 2019-09-21 VITALS — BP 112/72 | Wt 229.8 lb

## 2019-09-21 DIAGNOSIS — Z3A01 Less than 8 weeks gestation of pregnancy: Secondary | ICD-10-CM

## 2019-09-21 DIAGNOSIS — Z3481 Encounter for supervision of other normal pregnancy, first trimester: Secondary | ICD-10-CM

## 2019-09-21 DIAGNOSIS — Z113 Encounter for screening for infections with a predominantly sexual mode of transmission: Secondary | ICD-10-CM

## 2019-09-21 DIAGNOSIS — O99211 Obesity complicating pregnancy, first trimester: Secondary | ICD-10-CM

## 2019-09-21 DIAGNOSIS — Z3480 Encounter for supervision of other normal pregnancy, unspecified trimester: Secondary | ICD-10-CM

## 2019-09-21 DIAGNOSIS — Z131 Encounter for screening for diabetes mellitus: Secondary | ICD-10-CM | POA: Diagnosis not present

## 2019-09-21 LAB — POCT URINALYSIS DIPSTICK OB
Glucose, UA: NEGATIVE
POC,PROTEIN,UA: NEGATIVE

## 2019-09-21 NOTE — Progress Notes (Signed)
Routine Prenatal Care Visit  Subjective  Alexandria [name] is a 34 y.o. [id number] at [redacted]w[redacted]d being seen today for ongoing prenatal care.  She is currently monitored for the following issues for this low-risk pregnancy and has CIN III (cervical intraepithelial neoplasia grade III) with severe dysplasia; Anxiety; Goiter, nontoxic, multinodular; Verruca vulgaris; Neoplasm of uncertain behavior of skin; Depression, recurrent (Alexandria [name]); Obesity (BMI 35.0-39.9 without comorbidity); Thyroid nodule; Supervision of normal pregnancy; and Obesity affecting pregnancy in first trimester on their problem list.  ----------------------------------------------------------------------------------- Patient reports no complaints.   Contractions: Not present. Vag. Bleeding: None.  Movement: Absent. Denies leaking of fluid.  ----------------------------------------------------------------------------------- The following portions of the patient's history were reviewed and updated as appropriate: allergies, current medications, past family history, past medical history, past social history, past surgical history and problem list. Problem list updated.   Objective  Blood pressure 112/72, weight 229 lb 12.8 oz (104.2 kg), last menstrual period 07/28/2019, unknown if currently breastfeeding. Pregravid weight 227 lb (103 kg) Total Weight Gain 2 lb 12.8 oz (1.27 kg) Urinalysis:      Fetal Status: Fetal Heart Rate (bpm): 165   Movement: Absent     General:  Alert, oriented and cooperative. Patient is in no acute distress.  Skin: Skin is warm and dry. No rash noted.   Cardiovascular: Normal heart rate noted  Respiratory: Normal respiratory effort, no problems with respiration noted  Abdomen: Soft, gravid, appropriate for gestational age. Pain/Pressure: Absent     Pelvic:  Cervical exam deferred        Extremities: Normal range of motion.  Edema: None  Mental Status: Normal mood and affect. Normal behavior. Normal judgment  and thought content.     Assessment   34 y.o. [id number] at [redacted]w[redacted]d by  05/03/2020, by Last Menstrual Period presenting for routine prenatal visit  Plan   pregnancy8 Problems (from 09/15/19 to present)    Problem Noted Resolved   Supervision of normal pregnancy 09/15/2019 by Rod Can, CNM No   Overview Addendum 09/21/2019  3:42 PM by Homero Fellers, MD    [facility] Prenatal Labs  Dating  LMP = 7 wk [place] Blood type: --/--/A NEG (01/23 1216)   Genetic Screen 1 Screen:    AFP:     Quad:     NIPS: Antibody:NEG (01/23 1216)  Anatomic [place]  Rubella:   Varicella: @VZVIGG @  GTT Early:                Third trimester:  RPR:     Rhogam  HBsAg:     Vaccines TDAP:                       Flu Shot: HIV:     Baby Food                                GBS:   Contraception  Pap: 2020 negative  CBB     CS/VBAC NA   Support Person Alexandria [name]            NOB labs today Dating [place] today  Gestational age appropriate obstetric precautions including but not limited to vaginal bleeding, contractions, leaking of fluid and fetal movement were reviewed in detail with the patient.    Return in about 3 weeks (around 10/12/2019) for ROB in person.  Homero Fellers MD [facility] 09/21/2019, 3:42 PM

## 2019-09-21 NOTE — Patient Instructions (Signed)
First Trimester of Pregnancy The first trimester of pregnancy is from week 1 until the end of week 13 (months 1 through 3). A week after a sperm fertilizes an egg, the egg will implant on the wall of the uterus. This embryo will begin to develop into a baby. Genes from you and your partner will form the baby. The female genes will determine whether the baby will be a boy or a girl. At 6-8 weeks, the eyes and face will be formed, and the heartbeat can be seen on ultrasound. At the end of 12 weeks, all the baby's organs will be formed. Now that you are pregnant, you will want to do everything you can to have a healthy baby. Two of the most important things are to get good prenatal care and to follow your health care provider's instructions. Prenatal care is all the medical care you receive before the baby's birth. This care will help prevent, find, and treat any problems during the pregnancy and childbirth. Body changes during your first trimester Your body goes through many changes during pregnancy. The changes vary from woman to woman.  You may gain or lose a couple of pounds at first.  You may feel sick to your stomach (nauseous) and you may throw up (vomit). If the vomiting is uncontrollable, call your health care provider.  You may tire easily.  You may develop headaches that can be relieved by medicines. All medicines should be approved by your health care provider.  You may urinate more often. Painful urination may mean you have a bladder infection.  You may develop heartburn as a result of your pregnancy.  You may develop constipation because certain hormones are causing the muscles that push stool through your intestines to slow down.  You may develop hemorrhoids or swollen veins (varicose veins).  Your breasts may begin to grow larger and become tender. Your nipples may stick out more, and the tissue that surrounds them (areola) may become darker.  Your gums may bleed and may be  sensitive to brushing and flossing.  Dark spots or blotches (chloasma, mask of pregnancy) may develop on your face. This will likely fade after the baby is born.  Your menstrual periods will stop.  You may have a loss of appetite.  You may develop cravings for certain kinds of food.  You may have changes in your emotions from day to day, such as being excited to be pregnant or being concerned that something may go wrong with the pregnancy and baby.  You may have more vivid and strange dreams.  You may have changes in your hair. These can include thickening of your hair, rapid growth, and changes in texture. Some women also have hair loss during or after pregnancy, or hair that feels dry or thin. Your hair will most likely return to normal after your baby is born. What to expect at prenatal visits During a routine prenatal visit:  You will be weighed to make sure you and the baby are growing normally.  Your blood pressure will be taken.  Your abdomen will be measured to track your baby's growth.  The fetal heartbeat will be listened to between weeks 10 and 14 of your pregnancy.  Test results from any previous visits will be discussed. Your health care provider may ask you:  How you are feeling.  If you are feeling the baby move.  If you have had any abnormal symptoms, such as leaking fluid, bleeding, severe headaches, or abdominal   cramping.  If you are using any tobacco products, including cigarettes, chewing tobacco, and electronic cigarettes.  If you have any questions. Other tests that may be performed during your first trimester include:  Blood tests to find your blood type and to check for the presence of any previous infections. The tests will also be used to check for low iron levels (anemia) and protein on red blood cells (Rh antibodies). Depending on your risk factors, or if you previously had diabetes during pregnancy, you may have tests to check for high blood sugar  that affects pregnant women (gestational diabetes).  Urine tests to check for infections, diabetes, or protein in the urine.  An ultrasound to confirm the proper growth and development of the baby.  Fetal screens for spinal cord problems (spina bifida) and Down syndrome.  HIV (human immunodeficiency virus) testing. Routine prenatal testing includes screening for HIV, unless you choose not to have this test.  You may need other tests to make sure you and the baby are doing well. Follow these instructions at home: Medicines  Follow your health care provider's instructions regarding medicine use. Specific medicines may be either safe or unsafe to take during pregnancy.  Take a prenatal vitamin that contains at least 600 micrograms (mcg) of folic acid.  If you develop constipation, try taking a stool softener if your health care provider approves. Eating and drinking   Eat a balanced diet that includes fresh fruits and vegetables, whole grains, good sources of protein such as meat, eggs, or tofu, and low-fat dairy. Your health care provider will help you determine the amount of weight gain that is right for you.  Avoid raw meat and uncooked cheese. These carry germs that can cause birth defects in the baby.  Eating four or five small meals rather than three large meals a day may help relieve nausea and vomiting. If you start to feel nauseous, eating a few soda crackers can be helpful. Drinking liquids between meals, instead of during meals, also seems to help ease nausea and vomiting.  Limit foods that are high in fat and processed sugars, such as fried and sweet foods.  To prevent constipation: ? Eat foods that are high in fiber, such as fresh fruits and vegetables, whole grains, and beans. ? Drink enough fluid to keep your urine clear or pale yellow. Activity  Exercise only as directed by your health care provider. Most women can continue their usual exercise routine during  pregnancy. Try to exercise for 30 minutes at least 5 days a week. Exercising will help you: ? Control your weight. ? Stay in shape. ? Be prepared for labor and delivery.  Experiencing pain or cramping in the lower abdomen or lower back is a good sign that you should stop exercising. Check with your health care provider before continuing with normal exercises.  Try to avoid standing for long periods of time. Move your legs often if you must stand in one place for a long time.  Avoid heavy lifting.  Wear low-heeled shoes and practice good posture.  You may continue to have sex unless your health care provider tells you not to. Relieving pain and discomfort  Wear a good support bra to relieve breast tenderness.  Take warm sitz baths to soothe any pain or discomfort caused by hemorrhoids. Use hemorrhoid cream if your health care provider approves.  Rest with your legs elevated if you have leg cramps or low back pain.  If you develop varicose veins in   your legs, wear support hose. Elevate your feet for 15 minutes, 3-4 times a day. Limit salt in your diet. Prenatal care  Schedule your prenatal visits by the twelfth week of pregnancy. They are usually scheduled monthly at first, then more often in the last 2 months before delivery.  Write down your questions. Take them to your prenatal visits.  Keep all your prenatal visits as told by your health care provider. This is important. Safety  Wear your seat belt at all times when driving.  Make a list of emergency phone numbers, including numbers for family, friends, the hospital, and police and fire departments. General instructions  Ask your health care provider for a referral to a local prenatal education class. Begin classes no later than the beginning of month 6 of your pregnancy.  Ask for help if you have counseling or nutritional needs during pregnancy. Your health care provider can offer advice or refer you to specialists for help  with various needs.  Do not use hot tubs, steam rooms, or saunas.  Do not douche or use tampons or scented sanitary pads.  Do not cross your legs for long periods of time.  Avoid cat litter boxes and soil used by cats. These carry germs that can cause birth defects in the baby and possibly loss of the fetus by miscarriage or stillbirth.  Avoid all smoking, herbs, alcohol, and medicines not prescribed by your health care provider. Chemicals in these products affect the formation and growth of the baby.  Do not use any products that contain nicotine or tobacco, such as cigarettes and e-cigarettes. If you need help quitting, ask your health care provider. You may receive counseling support and other resources to help you quit.  Schedule a dentist appointment. At home, brush your teeth with a soft toothbrush and be gentle when you floss. Contact a health care provider if:  You have dizziness.  You have mild pelvic cramps, pelvic pressure, or nagging pain in the abdominal area.  You have persistent nausea, vomiting, or diarrhea.  You have a bad smelling vaginal discharge.  You have pain when you urinate.  You notice increased swelling in your face, hands, legs, or ankles.  You are exposed to fifth disease or chickenpox.  You are exposed to German measles (rubella) and have never had it. Get help right away if:  You have a fever.  You are leaking fluid from your vagina.  You have spotting or bleeding from your vagina.  You have severe abdominal cramping or pain.  You have rapid weight gain or loss.  You vomit blood or material that looks like coffee grounds.  You develop a severe headache.  You have shortness of breath.  You have any kind of trauma, such as from a fall or a car accident. Summary  The first trimester of pregnancy is from week 1 until the end of week 13 (months 1 through 3).  Your body goes through many changes during pregnancy. The changes vary from  woman to woman.  You will have routine prenatal visits. During those visits, your health care provider will examine you, discuss any test results you may have, and talk with you about how you are feeling. This information is not intended to replace advice given to you by your health care provider. Make sure you discuss any questions you have with your health care provider. Document Revised: 03/27/2017 Document Reviewed: 03/26/2016 Elsevier Patient Education  2020 [employer]  

## 2019-09-22 LAB — RPR+RH+ABO+RUB AB+AB SCR+CB...
Antibody Screen: NEGATIVE
HIV Screen 4th Generation wRfx: NONREACTIVE
Hematocrit: 42.1 % (ref 34.0–46.6)
Hemoglobin: 13.6 g/dL (ref 11.1–15.9)
Hepatitis B Surface Ag: NEGATIVE
MCH: 26.5 pg — ABNORMAL LOW (ref 26.6–33.0)
MCHC: 32.3 g/dL (ref 31.5–35.7)
MCV: 82 fL (ref 79–97)
Platelets: 295 10*3/uL (ref 150–450)
RBC: 5.13 x10E6/uL (ref 3.77–5.28)
RDW: 13.1 % (ref 11.7–15.4)
RPR Ser Ql: NONREACTIVE
Rh Factor: NEGATIVE
Rubella Antibodies, IGG: <0.9 index — ABNORMAL LOW (ref >0.99)
Varicella zoster IgG: 240 index (ref >165)
WBC: 8.2 10*3/uL (ref 3.4–10.8)

## 2019-09-22 LAB — GLUCOSE, 1 HOUR GESTATIONAL: Gestational Diabetes Screen: 112 mg/dL (ref 65–139)

## 2019-09-23 ENCOUNTER — Other Ambulatory Visit: Payer: Medicaid Other

## 2019-09-23 ENCOUNTER — Encounter: Payer: Medicaid Other | Admitting: Advanced Practice Midwife

## 2019-10-04 ENCOUNTER — Ambulatory Visit: Payer: Medicaid Other | Admitting: Rheumatology

## 2019-10-07 ENCOUNTER — Other Ambulatory Visit: Payer: Self-pay | Admitting: Family Medicine

## 2019-10-07 NOTE — Telephone Encounter (Signed)
Requested medication (s) are due for refill today -yes  Requested medication (s) are on the active medication list -yes  Future visit scheduled -no, patient under OB care  Last refill: 2 months ago  Notes to clinic: Patient requesting RF of medication prescribed by provider no longer at practice- patient is pregnant at this time- does office continue medication- or does OB provider monitor during pregnancy? Request sent for PCP review   Requested Prescriptions  Pending Prescriptions Disp Refills   FLUoxetine (PROZAC) 20 MG capsule 90 capsule 1    Sig: Take 3 capsules (60 mg total) by mouth daily. (for mood; stop escitalopram)      Psychiatry:  Antidepressants - SSRI Passed - 10/07/2019  3:37 PM      Passed - Completed PHQ-2 or PHQ-9 in the last 360 days.      Passed - Valid encounter within last 6 months    Recent Outpatient Visits           1 month ago Less than [redacted] weeks gestation of pregnancy   [facility] Tow[place] Malkin, MD   7 months ago Well woman exam (no gynecological exam)   [place], FNP   8 months ago Bilateral leg pain   [place], FNP   1 year ago Hyperhidrosis   [place], NP   1 year ago Goiter, nontoxic, multinodular   [facility], Satira Anis, MD                  Requested Prescriptions  Pending Prescriptions Disp Refills   FLUoxetine (PROZAC) 20 MG capsule 90 capsule 1    Sig: Take 3 capsules (60 mg total) by mouth daily. (for mood; stop escitalopram)      Psychiatry:  Antidepressants - SSRI Passed - 10/07/2019  3:37 PM      Passed - Completed PHQ-2 or PHQ-9 in the last 360 days.      Passed - Valid encounter within last 6 months    Recent Outpatient Visits           1 month ago Less than [redacted] weeks gestation of pregnancy   [facility] Towanda Malkin, MD    7 months ago Well woman exam (no gynecological exam)   [place], FNP   8 months ago Bilateral leg pain   [place], FNP   1 year ago Hyperhidrosis   [place], NP   1 year ago Goiter, nontoxic, multinodular   [facility], Satira Anis, MD

## 2019-10-07 NOTE — Telephone Encounter (Signed)
Medication Refill - Medication: Generic Prozac 20 mg  Has the patient contacted their pharmacy? Yes.   (Agent: If no, request that the patient contact the pharmacy for the refill.) (Agent: If yes, when and what did the pharmacy advise?)  Preferred Pharmacy (with phone number or street name): [facility]  Agent: Please be advised that RX refills may take up to 3 business days. We ask that you follow-up with your pharmacy.

## 2019-10-10 NOTE — Telephone Encounter (Signed)
Patient may need to speak to a doctor/gyn to make sure it is ok to continue prozac while pregnant

## 2019-10-11 NOTE — Telephone Encounter (Signed)
Pt states Dr Shelia Media at her OBGYN advised her it is OK to continue the prozac while pregnant.

## 2019-10-11 NOTE — Telephone Encounter (Signed)
Left message for patient to call office to discuss medication

## 2019-10-12 ENCOUNTER — Other Ambulatory Visit: Payer: Self-pay

## 2019-10-12 ENCOUNTER — Encounter: Payer: Self-pay | Admitting: Obstetrics and Gynecology

## 2019-10-12 ENCOUNTER — Ambulatory Visit (INDEPENDENT_AMBULATORY_CARE_PROVIDER_SITE_OTHER): Payer: Medicaid Other | Admitting: Obstetrics and Gynecology

## 2019-10-12 VITALS — BP 120/70 | Ht 64.0 in | Wt 231.4 lb

## 2019-10-12 DIAGNOSIS — O0990 Supervision of high risk pregnancy, unspecified, unspecified trimester: Secondary | ICD-10-CM

## 2019-10-12 DIAGNOSIS — O99211 Obesity complicating pregnancy, first trimester: Secondary | ICD-10-CM

## 2019-10-12 DIAGNOSIS — Z3480 Encounter for supervision of other normal pregnancy, unspecified trimester: Secondary | ICD-10-CM

## 2019-10-12 DIAGNOSIS — O99340 Other mental disorders complicating pregnancy, unspecified trimester: Secondary | ICD-10-CM

## 2019-10-12 DIAGNOSIS — F32A Depression, unspecified: Secondary | ICD-10-CM

## 2019-10-12 DIAGNOSIS — Z3A10 10 weeks gestation of pregnancy: Secondary | ICD-10-CM

## 2019-10-12 DIAGNOSIS — Z1379 Encounter for other screening for genetic and chromosomal anomalies: Secondary | ICD-10-CM | POA: Diagnosis not present

## 2019-10-12 DIAGNOSIS — F329 Major depressive disorder, single episode, unspecified: Secondary | ICD-10-CM

## 2019-10-12 MED ORDER — FLUOXETINE HCL 20 MG PO CAPS: 60.0000 mg | ORAL_CAPSULE | Freq: Every day | ORAL | 1 refills | Status: AC

## 2019-10-12 MED ORDER — FLUOXETINE HCL 20 MG PO CAPS: 60.0000 mg | ORAL_CAPSULE | Freq: Every day | ORAL | 1 refills | Status: DC

## 2019-10-12 NOTE — Addendum Note (Signed)
Addended by: Delsa Grana on: 10/12/2019 10:54 AM   Modules accepted: Orders

## 2019-10-12 NOTE — Progress Notes (Signed)
Routine Prenatal Care Visit  Subjective  Alexandria [name] is a 34 y.o. [id number] at [redacted]w[redacted]d being seen today for ongoing prenatal care.  She is currently monitored for the following issues for this high-risk pregnancy and has CIN III (cervical intraepithelial neoplasia grade III) with severe dysplasia; Anxiety; Goiter, nontoxic, multinodular; Verruca vulgaris; Neoplasm of uncertain behavior of skin; Depression, recurrent ([place]); Obesity (BMI 35.0-39.9 without comorbidity); Thyroid nodule; Supervision of other normal pregnancy, antepartum; and Obesity affecting pregnancy in first trimester on their problem list.  ----------------------------------------------------------------------------------- Patient reports she is taking gabapentin for knee and ankle pain. She needs a refill of prozac. She has an appointment with rheumatology soon because of family history of joint pain.   Contractions: Not present. Vag. Bleeding: None.  Movement: Absent. Denies leaking of fluid.  ----------------------------------------------------------------------------------- The following portions of the patient's history were reviewed and updated as appropriate: allergies, current medications, past family history, past medical history, past social history, past surgical history and problem list. Problem list updated.   Objective  Blood pressure 120/70, height 5\' 4"  (1.626 m), weight 231 lb 6.4 oz (105 kg), last menstrual period 07/28/2019, unknown if currently breastfeeding. Pregravid weight 227 lb (103 kg) Total Weight Gain 4 lb 6.4 oz (1.996 kg) Urinalysis:      Fetal Status: Fetal Heart Rate (bpm): 154   Movement: Absent     General:  Alert, oriented and cooperative. Patient is in no acute distress.  Skin: Skin is warm and dry. No rash noted.   Cardiovascular: Normal heart rate noted  Respiratory: Normal respiratory effort, no problems with respiration noted  Abdomen: Soft, gravid, appropriate for gestational age.  Pain/Pressure: Absent     Pelvic:  Cervical exam deferred        Extremities: Normal range of motion.  Edema: None  Mental Status: Normal mood and affect. Normal behavior. Normal judgment and thought content.     Assessment   34 y.o. [id number] at [redacted]w[redacted]d by  05/03/2020, by Last Menstrual Period presenting for routine prenatal visit  Plan   pregnancy8 Problems (from 09/15/19 to present)    Problem Noted Resolved   Supervision of other normal pregnancy, antepartum 09/15/2019 by Rod Can, CNM No   Overview Addendum 10/12/2019  3:45 PM by Homero Fellers, MD    [facility] Prenatal Labs  Dating  LMP = 7 wk [place] Blood type: A/Negative/-- (05/26 1538)   Genetic Screen 1 Screen:    AFP:     Quad:     NIPS: Antibody:Negative (05/26 1538)  Anatomic [place]  Rubella: <0.90 (05/26 1538) Varicella: immune  GTT Early:                Third trimester:  RPR: Non Reactive (05/26 1538)   Rhogam  HBsAg: Negative (05/26 1538)   Vaccines TDAP:                       Flu Shot: HIV: Non Reactive (05/26 1538)   Baby Food                                GBS:   Contraception  Pap: 2020 negative  CBB     CS/VBAC NA   Support Person Freida Busman          Previous Version      Encouraged tylenol over gabapentin  Refill for prozac sent Maternit21 testing today.  Gestational age appropriate  obstetric precautions including but not limited to vaginal bleeding, contractions, leaking of fluid and fetal movement were reviewed in detail with the patient.    Return in about 2 weeks (around 10/26/2019) for ROB in person.  Homero Fellers MD [facility] 10/12/2019, 3:45 PM

## 2019-10-12 NOTE — Addendum Note (Signed)
Addended by: Docia Furl on: 10/12/2019 11:27 AM   Modules accepted: Orders

## 2019-10-12 NOTE — Patient Instructions (Signed)
First Trimester of Pregnancy The first trimester of pregnancy is from week 1 until the end of week 13 (months 1 through 3). A week after a sperm fertilizes an egg, the egg will implant on the wall of the uterus. This embryo will begin to develop into a baby. Genes from you and your partner will form the baby. The female genes will determine whether the baby will be a boy or a girl. At 6-8 weeks, the eyes and face will be formed, and the heartbeat can be seen on ultrasound. At the end of 12 weeks, all the baby's organs will be formed. Now that you are pregnant, you will want to do everything you can to have a healthy baby. Two of the most important things are to get good prenatal care and to follow your health care provider's instructions. Prenatal care is all the medical care you receive before the baby's birth. This care will help prevent, find, and treat any problems during the pregnancy and childbirth. Body changes during your first trimester Your body goes through many changes during pregnancy. The changes vary from woman to woman.  You may gain or lose a couple of pounds at first.  You may feel sick to your stomach (nauseous) and you may throw up (vomit). If the vomiting is uncontrollable, call your health care provider.  You may tire easily.  You may develop headaches that can be relieved by medicines. All medicines should be approved by your health care provider.  You may urinate more often. Painful urination may mean you have a bladder infection.  You may develop heartburn as a result of your pregnancy.  You may develop constipation because certain hormones are causing the muscles that push stool through your intestines to slow down.  You may develop hemorrhoids or swollen veins (varicose veins).  Your breasts may begin to grow larger and become tender. Your nipples may stick out more, and the tissue that surrounds them (areola) may become darker.  Your gums may bleed and may be  sensitive to brushing and flossing.  Dark spots or blotches (chloasma, mask of pregnancy) may develop on your face. This will likely fade after the baby is born.  Your menstrual periods will stop.  You may have a loss of appetite.  You may develop cravings for certain kinds of food.  You may have changes in your emotions from day to day, such as being excited to be pregnant or being concerned that something may go wrong with the pregnancy and baby.  You may have more vivid and strange dreams.  You may have changes in your hair. These can include thickening of your hair, rapid growth, and changes in texture. Some women also have hair loss during or after pregnancy, or hair that feels dry or thin. Your hair will most likely return to normal after your baby is born. What to expect at prenatal visits During a routine prenatal visit:  You will be weighed to make sure you and the baby are growing normally.  Your blood pressure will be taken.  Your abdomen will be measured to track your baby's growth.  The fetal heartbeat will be listened to between weeks 10 and 14 of your pregnancy.  Test results from any previous visits will be discussed. Your health care provider may ask you:  How you are feeling.  If you are feeling the baby move.  If you have had any abnormal symptoms, such as leaking fluid, bleeding, severe headaches, or abdominal   cramping.  If you are using any tobacco products, including cigarettes, chewing tobacco, and electronic cigarettes.  If you have any questions. Other tests that may be performed during your first trimester include:  Blood tests to find your blood type and to check for the presence of any previous infections. The tests will also be used to check for low iron levels (anemia) and protein on red blood cells (Rh antibodies). Depending on your risk factors, or if you previously had diabetes during pregnancy, you may have tests to check for high blood sugar  that affects pregnant women (gestational diabetes).  Urine tests to check for infections, diabetes, or protein in the urine.  An ultrasound to confirm the proper growth and development of the baby.  Fetal screens for spinal cord problems (spina bifida) and Down syndrome.  HIV (human immunodeficiency virus) testing. Routine prenatal testing includes screening for HIV, unless you choose not to have this test.  You may need other tests to make sure you and the baby are doing well. Follow these instructions at home: Medicines  Follow your health care provider's instructions regarding medicine use. Specific medicines may be either safe or unsafe to take during pregnancy.  Take a prenatal vitamin that contains at least 600 micrograms (mcg) of folic acid.  If you develop constipation, try taking a stool softener if your health care provider approves. Eating and drinking   Eat a balanced diet that includes fresh fruits and vegetables, whole grains, good sources of protein such as meat, eggs, or tofu, and low-fat dairy. Your health care provider will help you determine the amount of weight gain that is right for you.  Avoid raw meat and uncooked cheese. These carry germs that can cause birth defects in the baby.  Eating four or five small meals rather than three large meals a day may help relieve nausea and vomiting. If you start to feel nauseous, eating a few soda crackers can be helpful. Drinking liquids between meals, instead of during meals, also seems to help ease nausea and vomiting.  Limit foods that are high in fat and processed sugars, such as fried and sweet foods.  To prevent constipation: ? Eat foods that are high in fiber, such as fresh fruits and vegetables, whole grains, and beans. ? Drink enough fluid to keep your urine clear or pale yellow. Activity  Exercise only as directed by your health care provider. Most women can continue their usual exercise routine during  pregnancy. Try to exercise for 30 minutes at least 5 days a week. Exercising will help you: ? Control your weight. ? Stay in shape. ? Be prepared for labor and delivery.  Experiencing pain or cramping in the lower abdomen or lower back is a good sign that you should stop exercising. Check with your health care provider before continuing with normal exercises.  Try to avoid standing for long periods of time. Move your legs often if you must stand in one place for a long time.  Avoid heavy lifting.  Wear low-heeled shoes and practice good posture.  You may continue to have sex unless your health care provider tells you not to. Relieving pain and discomfort  Wear a good support bra to relieve breast tenderness.  Take warm sitz baths to soothe any pain or discomfort caused by hemorrhoids. Use hemorrhoid cream if your health care provider approves.  Rest with your legs elevated if you have leg cramps or low back pain.  If you develop varicose veins in   your legs, wear support hose. Elevate your feet for 15 minutes, 3-4 times a day. Limit salt in your diet. Prenatal care  Schedule your prenatal visits by the twelfth week of pregnancy. They are usually scheduled monthly at first, then more often in the last 2 months before delivery.  Write down your questions. Take them to your prenatal visits.  Keep all your prenatal visits as told by your health care provider. This is important. Safety  Wear your seat belt at all times when driving.  Make a list of emergency phone numbers, including numbers for family, friends, the hospital, and police and fire departments. General instructions  Ask your health care provider for a referral to a local prenatal education class. Begin classes no later than the beginning of month 6 of your pregnancy.  Ask for help if you have counseling or nutritional needs during pregnancy. Your health care provider can offer advice or refer you to specialists for help  with various needs.  Do not use hot tubs, steam rooms, or saunas.  Do not douche or use tampons or scented sanitary pads.  Do not cross your legs for long periods of time.  Avoid cat litter boxes and soil used by cats. These carry germs that can cause birth defects in the baby and possibly loss of the fetus by miscarriage or stillbirth.  Avoid all smoking, herbs, alcohol, and medicines not prescribed by your health care provider. Chemicals in these products affect the formation and growth of the baby.  Do not use any products that contain nicotine or tobacco, such as cigarettes and e-cigarettes. If you need help quitting, ask your health care provider. You may receive counseling support and other resources to help you quit.  Schedule a dentist appointment. At home, brush your teeth with a soft toothbrush and be gentle when you floss. Contact a health care provider if:  You have dizziness.  You have mild pelvic cramps, pelvic pressure, or nagging pain in the abdominal area.  You have persistent nausea, vomiting, or diarrhea.  You have a bad smelling vaginal discharge.  You have pain when you urinate.  You notice increased swelling in your face, hands, legs, or ankles.  You are exposed to fifth disease or chickenpox.  You are exposed to German measles (rubella) and have never had it. Get help right away if:  You have a fever.  You are leaking fluid from your vagina.  You have spotting or bleeding from your vagina.  You have severe abdominal cramping or pain.  You have rapid weight gain or loss.  You vomit blood or material that looks like coffee grounds.  You develop a severe headache.  You have shortness of breath.  You have any kind of trauma, such as from a fall or a car accident. Summary  The first trimester of pregnancy is from week 1 until the end of week 13 (months 1 through 3).  Your body goes through many changes during pregnancy. The changes vary from  woman to woman.  You will have routine prenatal visits. During those visits, your health care provider will examine you, discuss any test results you may have, and talk with you about how you are feeling. This information is not intended to replace advice given to you by your health care provider. Make sure you discuss any questions you have with your health care provider. Document Revised: 03/27/2017 Document Reviewed: 03/26/2016 Elsevier Patient Education  2020 [employer]  

## 2019-10-12 NOTE — Telephone Encounter (Signed)
Please advise 

## 2019-10-12 NOTE — Telephone Encounter (Signed)
She is pregnant is this ok to send in?

## 2019-10-12 NOTE — Addendum Note (Signed)
Addended by: Docia Furl on: 10/12/2019 03:02 PM   Modules accepted: Orders

## 2019-10-18 DIAGNOSIS — M2550 Pain in unspecified joint: Secondary | ICD-10-CM | POA: Diagnosis not present

## 2019-10-18 DIAGNOSIS — Z3491 Encounter for supervision of normal pregnancy, unspecified, first trimester: Secondary | ICD-10-CM | POA: Diagnosis not present

## 2019-10-19 LAB — MATERNIT21 PLUS CORE+SCA
Fetal Fraction: 5
Monosomy X (Turner Syndrome): NOT DETECTED
Result (T21): NEGATIVE
Trisomy 13 (Patau syndrome): NEGATIVE
Trisomy 18 (Edwards syndrome): NEGATIVE
Trisomy 21 (Down syndrome): NEGATIVE
XXX (Triple X Syndrome): NOT DETECTED
XXY (Klinefelter Syndrome): NOT DETECTED
XYY (Jacobs Syndrome): NOT DETECTED

## 2019-10-25 ENCOUNTER — Encounter: Payer: Medicaid Other | Admitting: Certified Nurse Midwife

## 2019-11-02 DIAGNOSIS — F339 Major depressive disorder, recurrent, unspecified: Secondary | ICD-10-CM | POA: Diagnosis not present

## 2019-11-02 DIAGNOSIS — M2550 Pain in unspecified joint: Secondary | ICD-10-CM | POA: Diagnosis not present

## 2019-11-02 DIAGNOSIS — F419 Anxiety disorder, unspecified: Secondary | ICD-10-CM | POA: Diagnosis not present

## 2019-11-16 DIAGNOSIS — E042 Nontoxic multinodular goiter: Secondary | ICD-10-CM | POA: Diagnosis not present

## 2019-11-22 DIAGNOSIS — Z3480 Encounter for supervision of other normal pregnancy, unspecified trimester: Secondary | ICD-10-CM | POA: Diagnosis not present

## 2019-12-16 ENCOUNTER — Other Ambulatory Visit: Payer: Self-pay | Admitting: Obstetrics and Gynecology

## 2019-12-16 DIAGNOSIS — F329 Major depressive disorder, single episode, unspecified: Secondary | ICD-10-CM

## 2019-12-16 DIAGNOSIS — F32A Depression, unspecified: Secondary | ICD-10-CM

## 2019-12-23 DIAGNOSIS — Z3A21 21 weeks gestation of pregnancy: Secondary | ICD-10-CM | POA: Diagnosis not present

## 2019-12-23 DIAGNOSIS — R110 Nausea: Secondary | ICD-10-CM | POA: Diagnosis not present

## 2019-12-23 DIAGNOSIS — E041 Nontoxic single thyroid nodule: Secondary | ICD-10-CM | POA: Diagnosis not present

## 2019-12-23 DIAGNOSIS — F419 Anxiety disorder, unspecified: Secondary | ICD-10-CM | POA: Diagnosis not present

## 2019-12-23 DIAGNOSIS — O99342 Other mental disorders complicating pregnancy, second trimester: Secondary | ICD-10-CM | POA: Diagnosis not present

## 2019-12-23 DIAGNOSIS — E042 Nontoxic multinodular goiter: Secondary | ICD-10-CM | POA: Diagnosis not present

## 2019-12-23 DIAGNOSIS — O99282 Endocrine, nutritional and metabolic diseases complicating pregnancy, second trimester: Secondary | ICD-10-CM | POA: Diagnosis not present

## 2019-12-23 DIAGNOSIS — Z87891 Personal history of nicotine dependence: Secondary | ICD-10-CM | POA: Diagnosis not present

## 2019-12-23 DIAGNOSIS — Z79899 Other long term (current) drug therapy: Secondary | ICD-10-CM | POA: Diagnosis not present

## 2019-12-23 DIAGNOSIS — E785 Hyperlipidemia, unspecified: Secondary | ICD-10-CM | POA: Diagnosis not present

## 2019-12-23 DIAGNOSIS — O99891 Other specified diseases and conditions complicating pregnancy: Secondary | ICD-10-CM | POA: Diagnosis not present

## 2019-12-23 DIAGNOSIS — F329 Major depressive disorder, single episode, unspecified: Secondary | ICD-10-CM | POA: Diagnosis not present

## 2020-01-09 DIAGNOSIS — Z362 Encounter for other antenatal screening follow-up: Secondary | ICD-10-CM | POA: Diagnosis not present

## 2020-01-09 DIAGNOSIS — Z3A23 23 weeks gestation of pregnancy: Secondary | ICD-10-CM | POA: Diagnosis not present

## 2020-02-10 DIAGNOSIS — Z23 Encounter for immunization: Secondary | ICD-10-CM | POA: Diagnosis not present

## 2020-02-10 DIAGNOSIS — Z114 Encounter for screening for human immunodeficiency virus [HIV]: Secondary | ICD-10-CM | POA: Diagnosis not present

## 2020-02-10 DIAGNOSIS — Z1332 Encounter for screening for maternal depression: Secondary | ICD-10-CM | POA: Diagnosis not present

## 2020-02-10 DIAGNOSIS — O26891 Other specified pregnancy related conditions, first trimester: Secondary | ICD-10-CM | POA: Diagnosis not present

## 2020-02-10 DIAGNOSIS — O99211 Obesity complicating pregnancy, first trimester: Secondary | ICD-10-CM | POA: Diagnosis not present

## 2020-02-10 DIAGNOSIS — Z3483 Encounter for supervision of other normal pregnancy, third trimester: Secondary | ICD-10-CM | POA: Diagnosis not present

## 2020-02-10 DIAGNOSIS — Z113 Encounter for screening for infections with a predominantly sexual mode of transmission: Secondary | ICD-10-CM | POA: Diagnosis not present

## 2020-02-10 DIAGNOSIS — Z789 Other specified health status: Secondary | ICD-10-CM | POA: Diagnosis not present

## 2020-02-10 DIAGNOSIS — Z6791 Unspecified blood type, Rh negative: Secondary | ICD-10-CM | POA: Diagnosis not present

## 2020-02-10 DIAGNOSIS — Z3480 Encounter for supervision of other normal pregnancy, unspecified trimester: Secondary | ICD-10-CM | POA: Diagnosis not present

## 2020-02-21 DIAGNOSIS — O99810 Abnormal glucose complicating pregnancy: Secondary | ICD-10-CM | POA: Diagnosis not present

## 2020-02-21 DIAGNOSIS — R7309 Other abnormal glucose: Secondary | ICD-10-CM | POA: Diagnosis not present

## 2020-03-08 DIAGNOSIS — Z362 Encounter for other antenatal screening follow-up: Secondary | ICD-10-CM | POA: Diagnosis not present

## 2020-03-08 DIAGNOSIS — Z6841 Body Mass Index (BMI) 40.0 and over, adult: Secondary | ICD-10-CM | POA: Diagnosis not present

## 2020-03-08 DIAGNOSIS — O99213 Obesity complicating pregnancy, third trimester: Secondary | ICD-10-CM | POA: Diagnosis not present

## 2020-03-08 DIAGNOSIS — E669 Obesity, unspecified: Secondary | ICD-10-CM | POA: Diagnosis not present

## 2020-03-08 DIAGNOSIS — Z3A32 32 weeks gestation of pregnancy: Secondary | ICD-10-CM | POA: Diagnosis not present

## 2020-03-12 ENCOUNTER — Encounter: Payer: Medicaid Other | Admitting: Family Medicine

## 2020-04-05 ENCOUNTER — Ambulatory Visit: Payer: Medicaid Other | Admitting: Internal Medicine

## 2020-04-05 DIAGNOSIS — Z3480 Encounter for supervision of other normal pregnancy, unspecified trimester: Secondary | ICD-10-CM | POA: Diagnosis not present

## 2020-04-15 DIAGNOSIS — O99213 Obesity complicating pregnancy, third trimester: Secondary | ICD-10-CM | POA: Diagnosis not present

## 2020-04-15 DIAGNOSIS — Z3A37 37 weeks gestation of pregnancy: Secondary | ICD-10-CM | POA: Diagnosis not present

## 2020-04-15 DIAGNOSIS — E042 Nontoxic multinodular goiter: Secondary | ICD-10-CM | POA: Diagnosis not present

## 2020-04-15 DIAGNOSIS — O99343 Other mental disorders complicating pregnancy, third trimester: Secondary | ICD-10-CM | POA: Diagnosis not present

## 2020-04-15 DIAGNOSIS — F3341 Major depressive disorder, recurrent, in partial remission: Secondary | ICD-10-CM | POA: Diagnosis not present

## 2020-04-15 DIAGNOSIS — O99283 Endocrine, nutritional and metabolic diseases complicating pregnancy, third trimester: Secondary | ICD-10-CM | POA: Diagnosis not present

## 2020-04-15 DIAGNOSIS — O471 False labor at or after 37 completed weeks of gestation: Secondary | ICD-10-CM | POA: Diagnosis not present

## 2020-04-20 DIAGNOSIS — Z3A38 38 weeks gestation of pregnancy: Secondary | ICD-10-CM | POA: Diagnosis not present

## 2020-04-26 DIAGNOSIS — E876 Hypokalemia: Secondary | ICD-10-CM | POA: Diagnosis not present

## 2020-04-26 DIAGNOSIS — Z3480 Encounter for supervision of other normal pregnancy, unspecified trimester: Secondary | ICD-10-CM | POA: Diagnosis not present

## 2020-04-27 DIAGNOSIS — F32A Depression, unspecified: Secondary | ICD-10-CM | POA: Diagnosis not present

## 2020-04-27 DIAGNOSIS — O99343 Other mental disorders complicating pregnancy, third trimester: Secondary | ICD-10-CM | POA: Diagnosis not present

## 2020-04-27 DIAGNOSIS — Z3A39 39 weeks gestation of pregnancy: Secondary | ICD-10-CM | POA: Diagnosis not present

## 2020-04-27 DIAGNOSIS — O471 False labor at or after 37 completed weeks of gestation: Secondary | ICD-10-CM | POA: Diagnosis not present

## 2020-04-27 DIAGNOSIS — Z0379 Encounter for other suspected maternal and fetal conditions ruled out: Secondary | ICD-10-CM | POA: Diagnosis not present

## 2020-04-27 DIAGNOSIS — F419 Anxiety disorder, unspecified: Secondary | ICD-10-CM | POA: Diagnosis not present

## 2020-05-02 DIAGNOSIS — O26893 Other specified pregnancy related conditions, third trimester: Secondary | ICD-10-CM | POA: Diagnosis not present

## 2020-05-02 DIAGNOSIS — E785 Hyperlipidemia, unspecified: Secondary | ICD-10-CM | POA: Diagnosis not present

## 2020-05-02 DIAGNOSIS — F3341 Major depressive disorder, recurrent, in partial remission: Secondary | ICD-10-CM | POA: Diagnosis not present

## 2020-05-02 DIAGNOSIS — Z3A40 40 weeks gestation of pregnancy: Secondary | ICD-10-CM | POA: Diagnosis not present

## 2020-05-02 DIAGNOSIS — O323XX0 Maternal care for face, brow and chin presentation, not applicable or unspecified: Secondary | ICD-10-CM | POA: Diagnosis not present

## 2020-05-02 DIAGNOSIS — E049 Nontoxic goiter, unspecified: Secondary | ICD-10-CM | POA: Diagnosis not present

## 2020-05-02 DIAGNOSIS — O99344 Other mental disorders complicating childbirth: Secondary | ICD-10-CM | POA: Diagnosis not present

## 2020-05-02 DIAGNOSIS — F419 Anxiety disorder, unspecified: Secondary | ICD-10-CM | POA: Diagnosis not present

## 2020-05-02 DIAGNOSIS — Z01812 Encounter for preprocedural laboratory examination: Secondary | ICD-10-CM | POA: Diagnosis not present

## 2020-05-02 DIAGNOSIS — Z20822 Contact with and (suspected) exposure to covid-19: Secondary | ICD-10-CM | POA: Diagnosis not present

## 2020-05-02 DIAGNOSIS — Z3A39 39 weeks gestation of pregnancy: Secondary | ICD-10-CM | POA: Diagnosis not present

## 2020-05-02 DIAGNOSIS — Z87891 Personal history of nicotine dependence: Secondary | ICD-10-CM | POA: Diagnosis not present

## 2020-05-03 DIAGNOSIS — O323XX0 Maternal care for face, brow and chin presentation, not applicable or unspecified: Secondary | ICD-10-CM | POA: Diagnosis not present

## 2020-05-03 DIAGNOSIS — Z3A40 40 weeks gestation of pregnancy: Secondary | ICD-10-CM | POA: Diagnosis not present

## 2020-06-22 DIAGNOSIS — H5213 Myopia, bilateral: Secondary | ICD-10-CM | POA: Diagnosis not present

## 2020-07-06 DIAGNOSIS — R61 Generalized hyperhidrosis: Secondary | ICD-10-CM | POA: Diagnosis not present

## 2020-07-06 DIAGNOSIS — L219 Seborrheic dermatitis, unspecified: Secondary | ICD-10-CM | POA: Diagnosis not present

## 2020-07-06 DIAGNOSIS — D229 Melanocytic nevi, unspecified: Secondary | ICD-10-CM | POA: Diagnosis not present

## 2020-07-06 DIAGNOSIS — R21 Rash and other nonspecific skin eruption: Secondary | ICD-10-CM | POA: Diagnosis not present
